# Patient Record
Sex: Male | Born: 1970 | Race: White | Hispanic: No | Marital: Married | Smoking: Never smoker
Health system: Southern US, Community
[De-identification: ages and names within clinical notes are randomized; demographics above are authoritative.]

## PROBLEM LIST (undated history)

## (undated) DIAGNOSIS — K5792 Diverticulitis of intestine, part unspecified, without perforation or abscess without bleeding: Secondary | ICD-10-CM

## (undated) DIAGNOSIS — Z87442 Personal history of urinary calculi: Secondary | ICD-10-CM

## (undated) DIAGNOSIS — R42 Dizziness and giddiness: Secondary | ICD-10-CM

## (undated) DIAGNOSIS — L639 Alopecia areata, unspecified: Secondary | ICD-10-CM

## (undated) DIAGNOSIS — L309 Dermatitis, unspecified: Secondary | ICD-10-CM

## (undated) DIAGNOSIS — Z8489 Family history of other specified conditions: Secondary | ICD-10-CM

## (undated) DIAGNOSIS — G4730 Sleep apnea, unspecified: Secondary | ICD-10-CM

## (undated) DIAGNOSIS — F419 Anxiety disorder, unspecified: Secondary | ICD-10-CM

## (undated) DIAGNOSIS — R21 Rash and other nonspecific skin eruption: Secondary | ICD-10-CM

## (undated) DIAGNOSIS — Z7189 Other specified counseling: Secondary | ICD-10-CM

## (undated) DIAGNOSIS — Z113 Encounter for screening for infections with a predominantly sexual mode of transmission: Secondary | ICD-10-CM

## (undated) HISTORY — DX: Dizziness and giddiness: R42

## (undated) HISTORY — PX: ULNAR NERVE TRANSPOSITION: SHX2595

## (undated) HISTORY — DX: Alopecia areata, unspecified: L63.9

## (undated) HISTORY — PX: VASECTOMY: SHX75

## (undated) HISTORY — DX: Dermatitis, unspecified: L30.9

## (undated) HISTORY — PX: COLONOSCOPY: SHX174

## (undated) HISTORY — PX: TONSILLECTOMY: SUR1361

## (undated) HISTORY — PX: OTHER SURGICAL HISTORY: SHX169

---

## 2000-10-30 ENCOUNTER — Ambulatory Visit (HOSPITAL_BASED_OUTPATIENT_CLINIC_OR_DEPARTMENT_OTHER): Admission: RE | Admit: 2000-10-30 | Discharge: 2000-10-30 | Payer: Self-pay | Admitting: Surgery

## 2005-07-07 DIAGNOSIS — Z87442 Personal history of urinary calculi: Secondary | ICD-10-CM

## 2005-07-07 HISTORY — DX: Personal history of urinary calculi: Z87.442

## 2005-10-20 ENCOUNTER — Encounter: Admission: RE | Admit: 2005-10-20 | Discharge: 2005-10-20 | Payer: Self-pay | Admitting: Family Medicine

## 2005-10-20 IMAGING — US US SCROTUM
1 series · 14 of 25 positions shown · non-contrast
Comparison: none

CLINICAL DATA: Left testicular pain intermittently for one month.
SCROTAL ULTRASOUND:
TECHNIQUE: Complete ultrasound examination of the testicles, epididymis, and other scrotal structures was performed.
Testicles are normal in size and contour bilaterally with a normal ultrasound texture.  There is no evidence for a testicular mass.  There is normal color Doppler flow associated with the testicles bilaterally.  The right epididymis contains a 1.2 x .6 x .8 cm size simple appearing cyst within its tail portion.  There is also a tiny, 3-4 mm in size, right epididymal cyst seen within the head of the epididymis.  The left epididymis contains a 1 x .6 cm x .6 cm size simple appearing cyst within the body of the epididymis.  There is a small left hydrocele.  There is no varicocele.

[Series 1: unknown · 0.09mm/px · 14 of 53 slices shown]
[im 1/53]
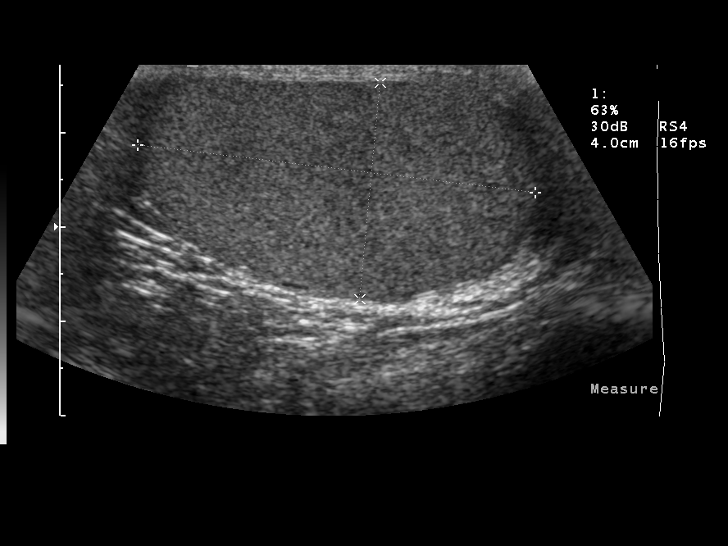
[im 5/53]
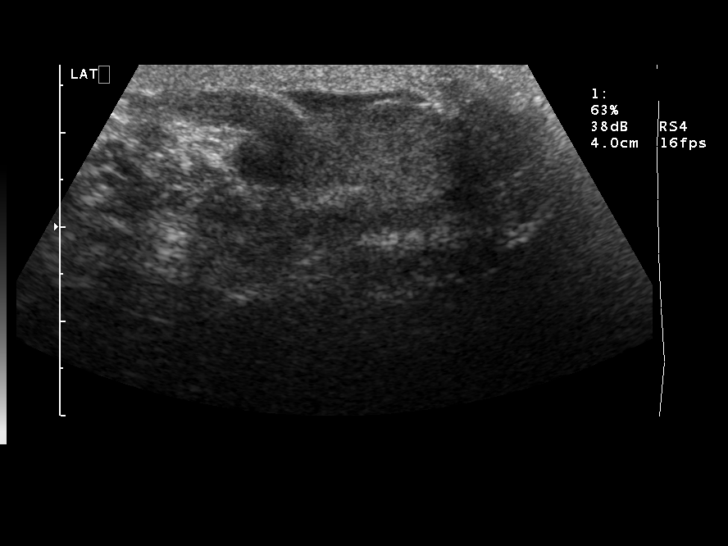
[im 9/53]
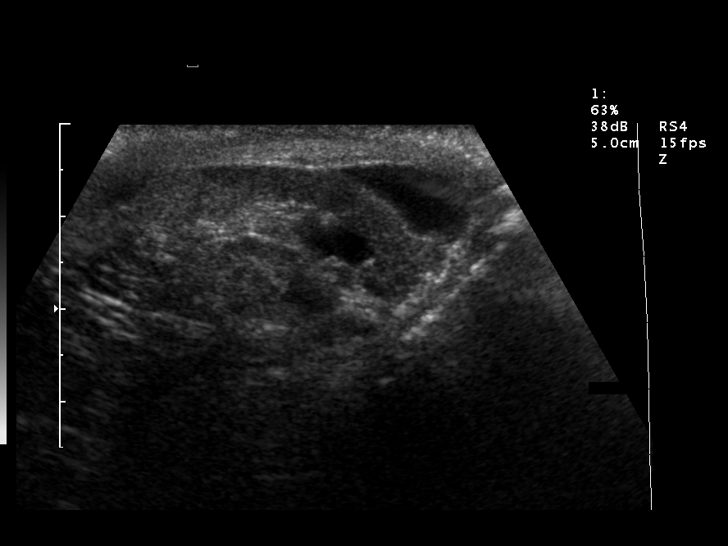
[im 14/53]
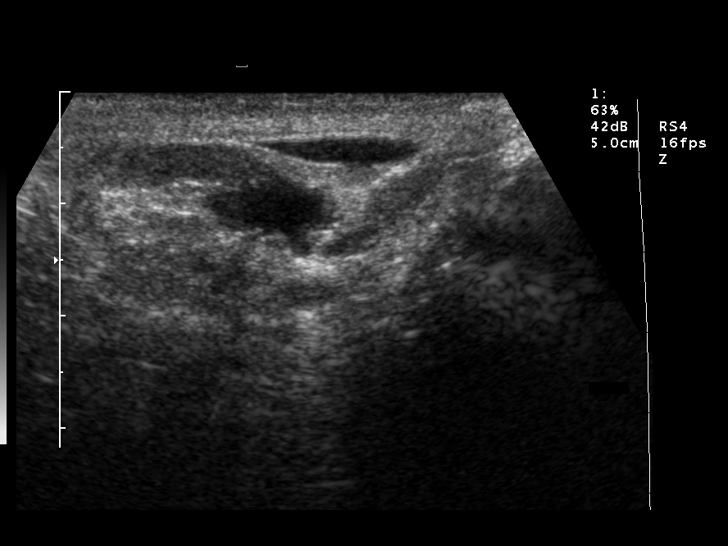
[im 18/53]
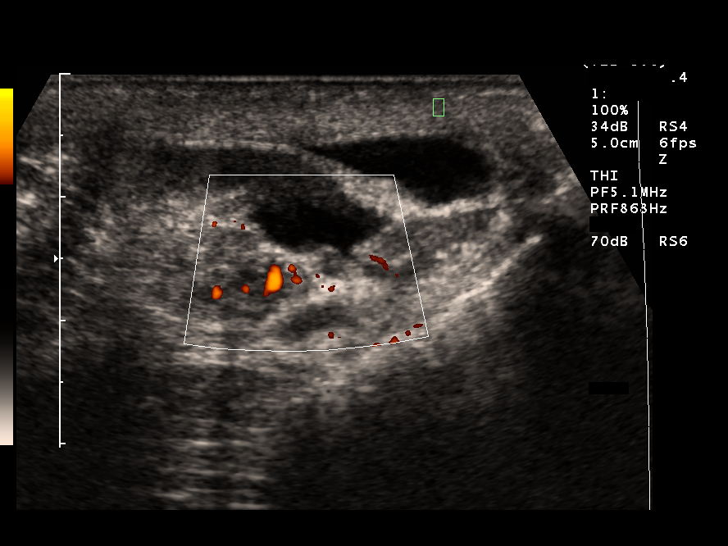
[im 20/53]
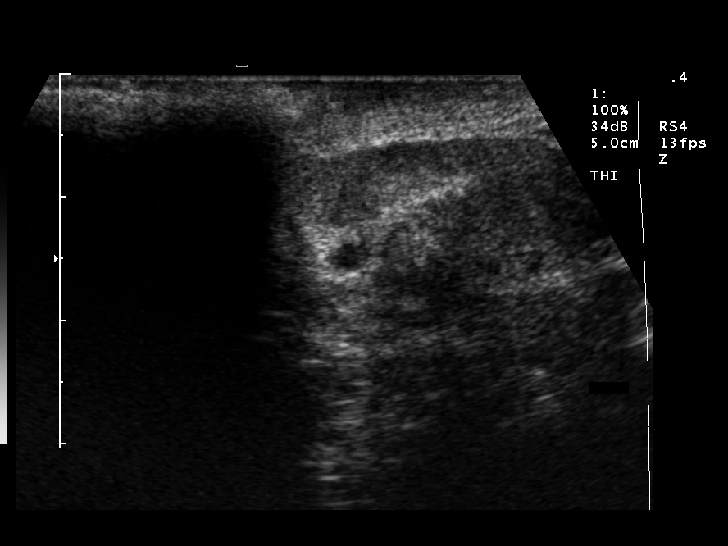
[im 24/53]
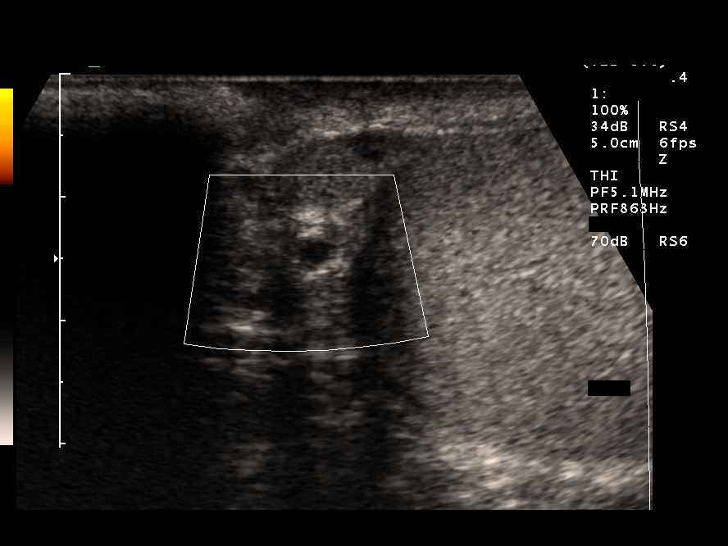
[im 29/53]
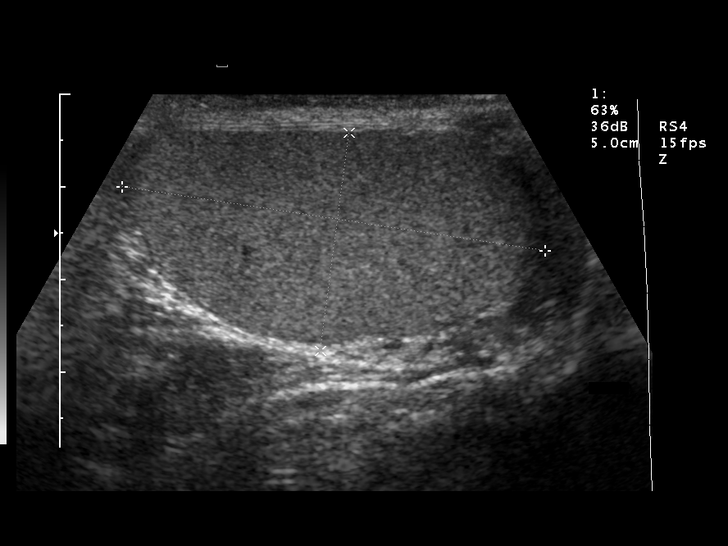
[im 33/53]
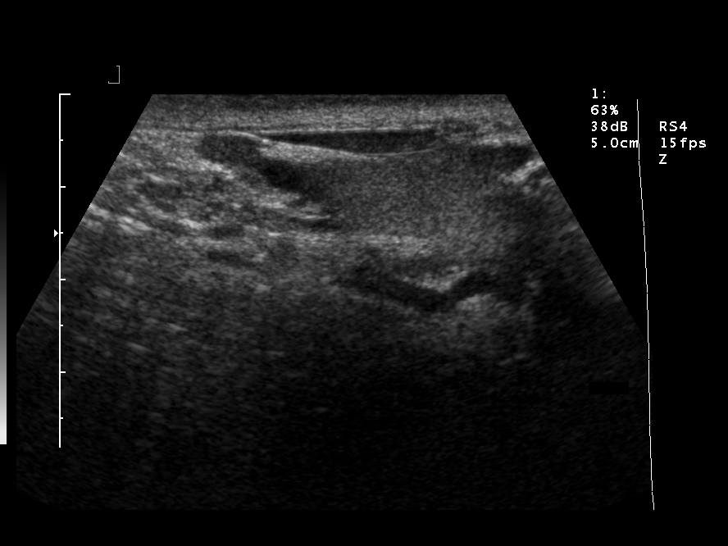
[im 35/53]
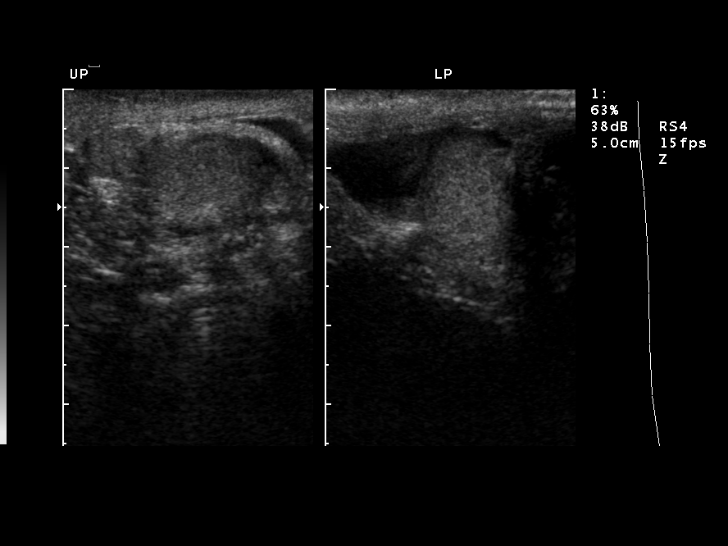
[im 40/53]
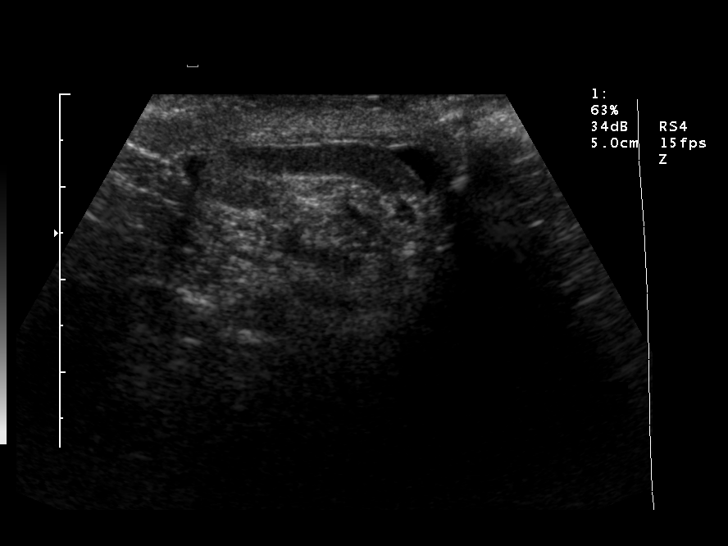
[im 44/53]
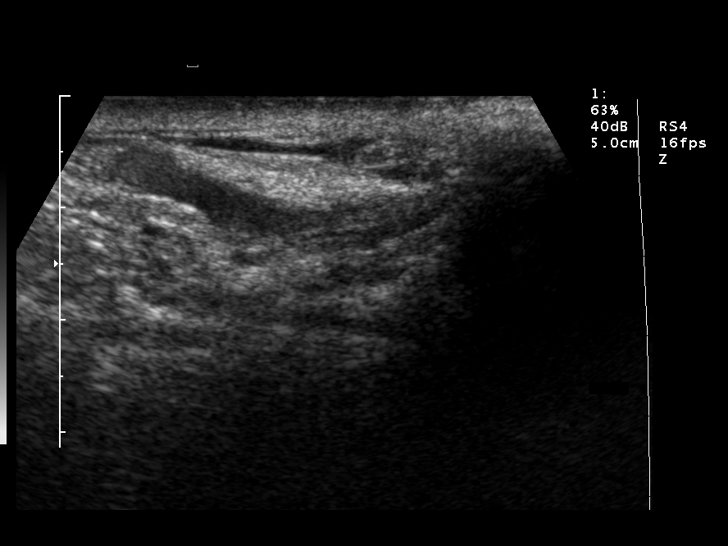
[im 48/53]
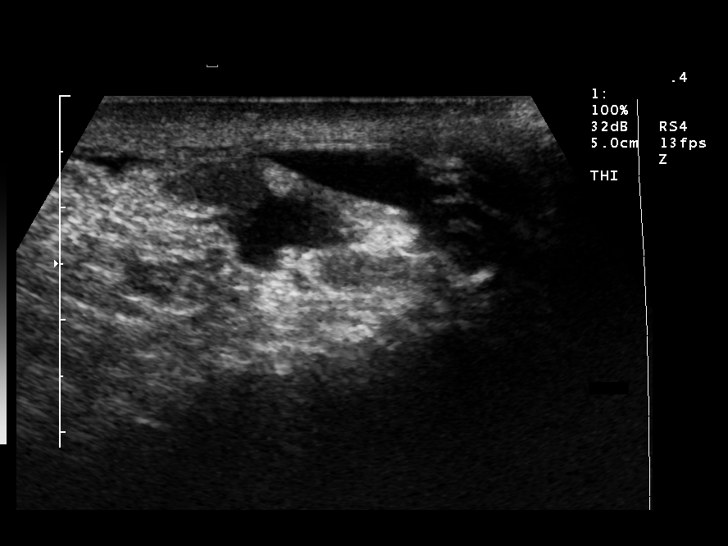
[im 53/53]
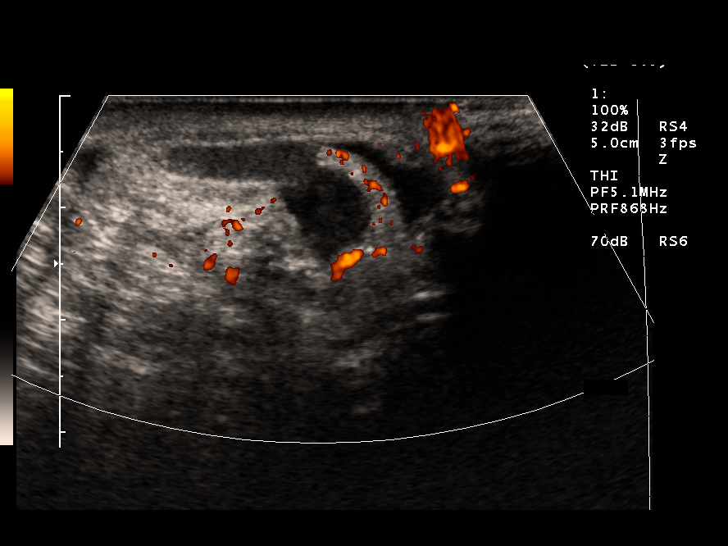

[14 of 25 positions shown; findings below may reference images not displayed]

IMPRESSION: Bilateral simple appearing epididymal cysts.  Normal appearing testicles.  Small left hydrocele.

## 2009-10-08 ENCOUNTER — Emergency Department (HOSPITAL_COMMUNITY): Admission: EM | Admit: 2009-10-08 | Discharge: 2009-10-08 | Payer: Self-pay | Admitting: Family Medicine

## 2010-04-06 IMAGING — CR DG ORBITS FOR FOREIGN BODY
2 series · 2 of 2 positions shown · non-contrast
Comparison: None.

CLINICAL DATA: 40-year-old male with history of metal exposure to
the orbits.  Planned MRI.

ORBITS FOR FOREIGN BODY - 2 VIEW

[w waters]
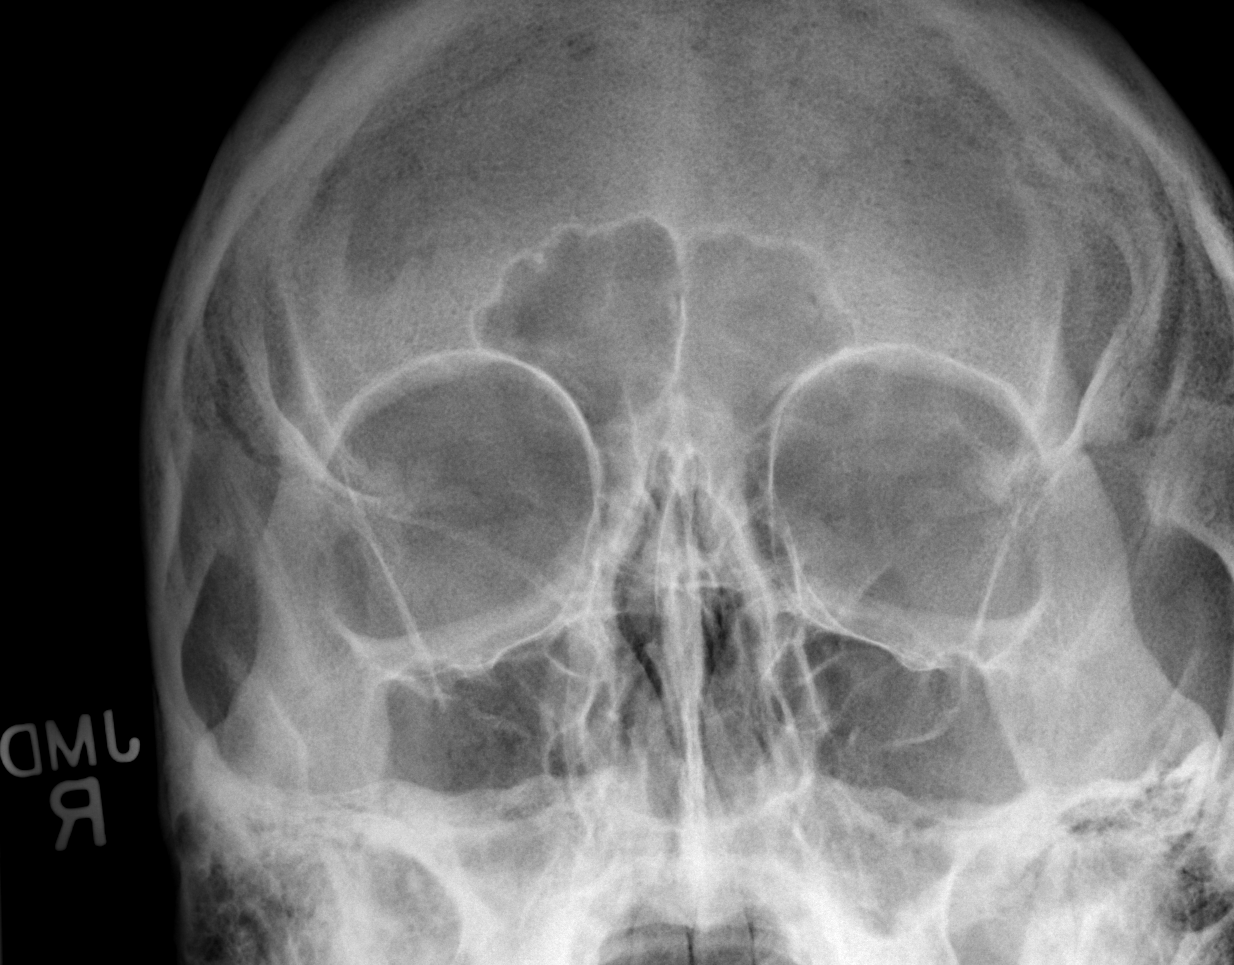

[w waters *]
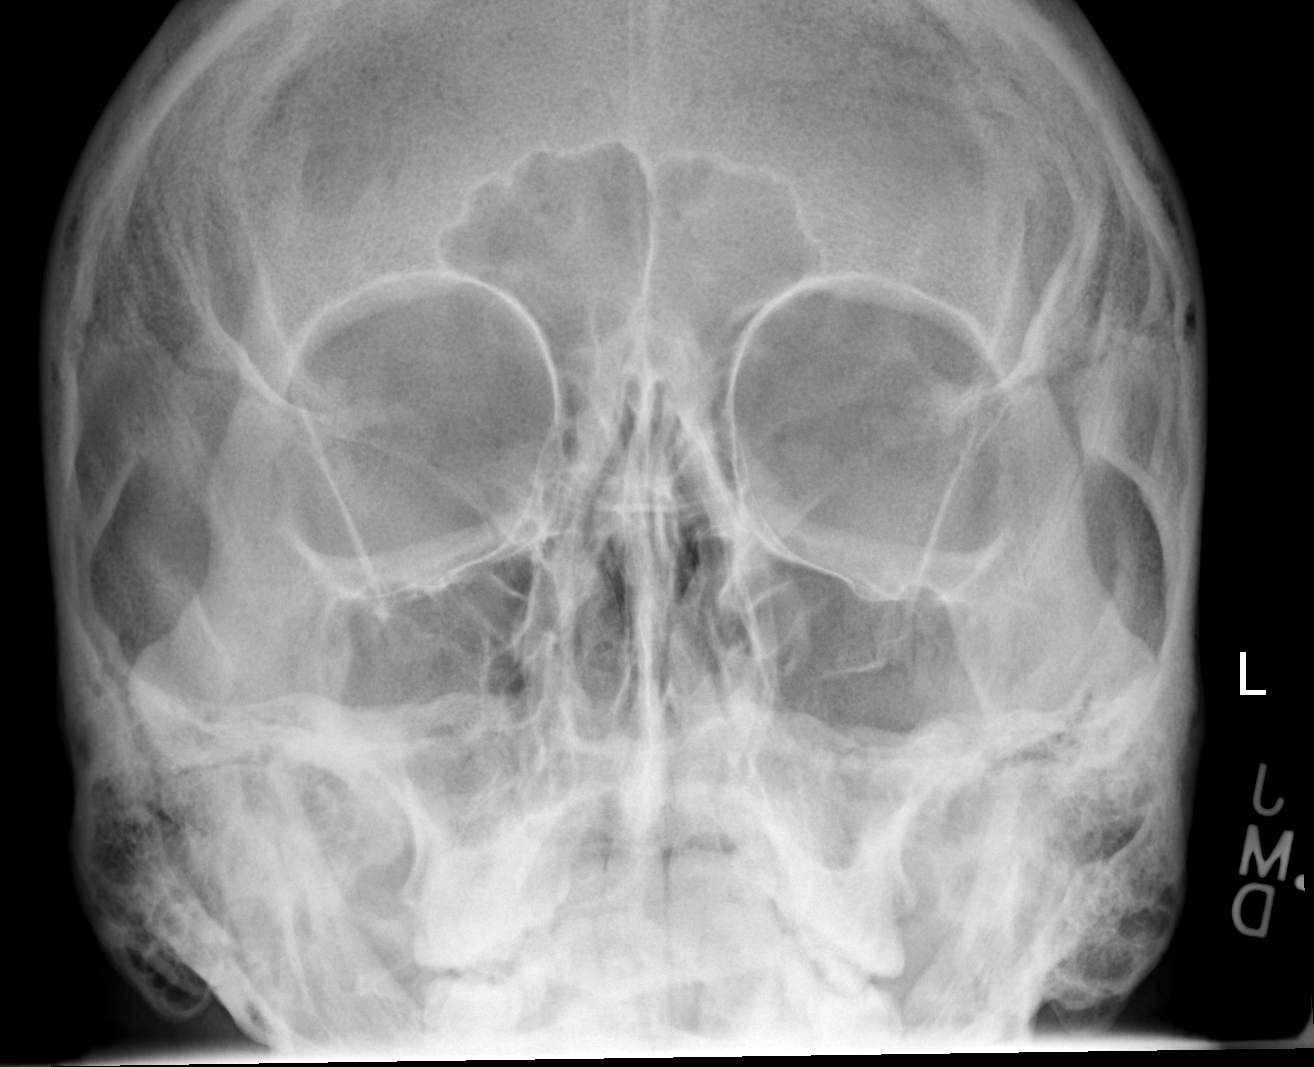

[2 of 2 positions shown; findings below may reference images not displayed]

FINDINGS: There is no evidence of metallic foreign body within the
orbits.  No significant bone abnormality identified. .
IMPRESSION: No evidence of metallic foreign body within the orbits.

## 2010-11-22 NOTE — Op Note (Signed)
[place]  Patient:    MARSHAUN, LORTIE                         MRN: [id number] Proc. Date: 10/30/00 Adm. Date:  [id number] Attending:  Katha Cabal                           Operative Report  CCS [phone].  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  POSTOPERATIVE DIAGNOSIS:  Eight lipomata involving left arm, right leg, left flank, right flank.  PROCEDURE:  Excision of nine separate lipomata; one right leg, one left arm, four left flank, three right flank.  DESCRIPTION OF PROCEDURE:  Cristofher Livecchi was taken to OR 7, given general by LMA. We started doing the arm and leg first, and we moved to the left flank and then the right flank, having to shift the patient.  Each time, the area was prepped with Betadine and draped sterilely.  These were removed with a small transverse incision.  They seemed to shell out nicely.  Some on the flank were quite big and multilobulated.  The resultant defects were then closed with 4-0 Vicryl subcuticularly, with benzoin and Steri-Strips.  These had an average size of 2 cm in diameter, and some were even larger.  The patient tolerated the procedure well and was taken to the recovery room.  He will be given Vicodin for pain.  He will be followed up in the office in about three weeks. DD:  10/30/00 TD:  10/31/00 Job: [id number]

## 2010-12-03 ENCOUNTER — Other Ambulatory Visit (HOSPITAL_COMMUNITY): Payer: Self-pay | Admitting: Orthopedic Surgery

## 2010-12-03 ENCOUNTER — Ambulatory Visit (HOSPITAL_COMMUNITY)
Admission: RE | Admit: 2010-12-03 | Discharge: 2010-12-03 | Disposition: A | Discharge status: Home / Self Care | Payer: Worker's Compensation | Source: Ambulatory Visit | Attending: Orthopedic Surgery | Admitting: Orthopedic Surgery

## 2010-12-03 DIAGNOSIS — Z0389 Encounter for observation for other suspected diseases and conditions ruled out: Secondary | ICD-10-CM | POA: Insufficient documentation

## 2010-12-03 DIAGNOSIS — IMO0002 Reserved for concepts with insufficient information to code with codable children: Secondary | ICD-10-CM

## 2013-09-14 LAB — AMB EXT LDL-C: LDL-C, External: 70

## 2014-04-13 NOTE — Telephone Encounter (Signed)
Pt called wanting refill on mobic script. Could not locate the script and dosage in records to get it reordered for you.

## 2014-04-13 NOTE — Telephone Encounter (Signed)
Please find out pharmacy.  I dont' think that I've ever given it to him before have I?   Either that or confirm this is the right patient.  But I will erx a few months work.  When I see him in November he will let me know how it works, Catering manageretc.

## 2014-04-14 MED ORDER — MELOXICAM 15 MG TAB
15 mg | ORAL_TABLET | Freq: Every day | ORAL | Status: DC
Start: 2014-04-14 — End: 2014-05-12

## 2014-04-14 NOTE — Telephone Encounter (Signed)
Verified mobic 15 mg 1 tab daily, [facility] which I just added to pt chart

## 2014-04-14 NOTE — Telephone Encounter (Signed)
erxd

## 2014-05-12 ENCOUNTER — Ambulatory Visit: Admit: 2014-05-12 | Discharge: 2014-05-12 | Payer: BLUE CROSS/BLUE SHIELD | Attending: Family | Primary: Family

## 2014-05-12 DIAGNOSIS — Z0000 Encounter for general adult medical examination without abnormal findings: Secondary | ICD-10-CM

## 2014-05-12 LAB — AMB POC COMPLETE CBC,AUTOMATED ENTER (RFM)
GRANULOCYTES (POC): 60.1 % (ref 45.0–75.0)
HCT (POC): 57.2 % — AB (ref 41.0–53.0)
HGB (POC): 19.1 g/dL — AB (ref 13.6–17.5)
LYMPHOCYTES (POC): 32.9 % (ref 15.0–45.0)
MCH (POC): 30.6 pg (ref 26–34)
MCHC (POC): 33.5 g/dL (ref 31–37)
MCV (POC): 91.4 fL (ref 80–100)
MONOCYTES (POC): 7 % (ref 2.0–11.0)
PLATELET (POC): 166 10*3/uL (ref 140–440)
RBC (POC): 6.26 10*6/uL — AB (ref 4.50–5.90)
WBC (POC): 5.3 10*3/uL (ref 3.4–10.0)

## 2014-05-12 LAB — AMB POC URINALYSIS DIP STICK MANUAL W/ MICRO
Amorphous Crystals: 0
Bacteria (UA POC): 0
Bilirubin (UA POC): NEGATIVE
Blood (UA POC): NEGATIVE
Casts: 0 [HPF] (ref 0–1)
Crystals (UA POC): 0
Glucose (UA POC): NEGATIVE
Ketones (UA POC): NEGATIVE
Leukocyte esterase (UA POC): NEGATIVE
Nitrites (UA POC): NEGATIVE
Other:: 0
Protein (UA POC): NEGATIVE mg/dL
RBC: 0 [HPF] (ref 0–3)
Specific gravity (UA POC): 1.02 (ref 1.003–1.035)
Urobilinogen (UA POC): 0.2 (ref 0.2–1)
pH (UA POC): 6 (ref 4.6–8.0)

## 2014-05-12 MED ORDER — MELOXICAM 15 MG TAB
15 mg | ORAL_TABLET | Freq: Every day | ORAL | Status: AC
Start: 2014-05-12 — End: 2014-06-11

## 2014-05-12 MED ORDER — AZITHROMYCIN 250 MG TAB
250 mg | Freq: Every day | ORAL | Status: AC
Start: 2014-05-12 — End: 2014-05-17

## 2014-05-12 MED ORDER — ATORVASTATIN 40 MG TAB
40 mg | ORAL_TABLET | Freq: Every day | ORAL | Status: AC
Start: 2014-05-12 — End: 2014-08-10

## 2014-05-12 MED ORDER — LISINOPRIL 5 MG TAB
5 mg | ORAL_TABLET | Freq: Every day | ORAL | Status: AC
Start: 2014-05-12 — End: 2014-08-10

## 2014-05-12 MED ORDER — CPAP MACHINE
PACK | Status: DC
Start: 2014-05-12 — End: 2016-07-13

## 2014-05-12 NOTE — Progress Notes (Signed)
Quick Note:        No additional comment    ______

## 2014-05-12 NOTE — Progress Notes (Signed)
Quick Note:        Normal urine    ______

## 2014-05-12 NOTE — Progress Notes (Signed)
Chief Complaint   Patient presents with   ??? Complete Physical        Alexander Howell is a 43 y.o. male   who is here for a physical.  States diet has been balanced including fruits, vegetables and proteins. Patient is  exercising regularly.  Reports occasional alcohol use and does not use tobacco products.  Does see the dentist regularly and has kept up with opthalmologstatic exams as appropriate.  When patient is out in sun for extended periods of time sun block is  used. Reports stable mood.  ROS below for purpose of exam today .   Has been nursing a cold last 7 days. States that in his chest.   States that gets a good bit of congestion up, has been using mucinex, no fever.   Also needs updated supplies on his cpap. Needs mask.     Past Medical History   Diagnosis Date   ??? Essential hypertension, benign    ??? Mixed hyperlipidemia    ??? Allergic rhinitis, cause unspecified    ??? Sleep apnea        Current Outpatient Prescriptions on File Prior to Visit   Medication Sig Dispense Refill   ??? meloxicam (MOBIC) 15 mg tablet Take 1 Tab by mouth daily for 30 days. 30 Tab 3   ??? lisinopril (PRINIVIL, ZESTRIL) 5 mg tablet Take  by mouth daily.     ??? atorvastatin (LIPITOR) 40 mg tablet Take  by mouth daily.     ??? cetirizine (ZYRTEC) 10 mg tablet Take  by mouth daily.       No current facility-administered medications on file prior to visit.       No past surgical history on file.    Family History   Problem Relation Age of Onset   ??? Hypertension Mother    ??? High Cholesterol Mother    ??? Hypertension Father    ??? High Cholesterol Father    ??? Diabetes Maternal Aunt    ??? Heart Disease Paternal Uncle    ??? Heart Disease Paternal Grandfather        No Known Allergies    History     Social History   ??? Marital Status: MARRIED     Spouse Name: N/A     Number of Children: N/A   ??? Years of Education: N/A     Occupational History   ??? Not on file.     Social History Main Topics   ??? Smoking status: Never Smoker     ??? Smokeless tobacco: Not on file   ??? Alcohol Use: 0.0 oz/week     0 Not specified per week      Comment: monthly or less; 1-2 drinks   ??? Drug Use: Not on file   ??? Sexual Activity: Not on file     Other Topics Concern   ??? Not on file     Social History Narrative       Review of Systems - General ROS: negative for - chills, fatigue, fever, malaise, night sweats, weight gain or weight loss  Ophthalmic ROS: negative for - decreased vision, eye pain or loss of vision  ENT ROS: negative for - epistaxis, headaches, hearing change, nasal congestion, sinus pain, sore throat or tinnitus  Allergy and Immunology ROS: negative for - hives, itchy/watery eyes, nasal congestion or seasonal allergies  Hematological and Lymphatic ROS: negative for - bleeding problems, bruising, night sweats, swollen lymph nodes or weight loss  Endocrine ROS: negative for - breast changes, galactorrhea, hot flashes, mood swings, polydipsia/polyuria or unexpected weight changes  Breast ROS: negative for - changes  Respiratory ROS: negative for - cough, orthopnea, shortness of breath, tachypnea or wheezing  Cardiovascular ROS: negative for - chest pain, dyspnea on exertion, edema, irregular heartbeat, loss of consciousness, orthopnea, palpitations, paroxysmal nocturnal dyspnea, rapid heart rate or shortness of breath  Gastrointestinal ROS: negative for - abdominal pain, appetite loss, blood in stools, change in bowel habits, change in stools, constipation, diarrhea or heartburn  Genito-Urinary ROS: negative for - urinary hesitancy, urinary frequency, weak urinary stream, testicle lumps or pain, penile discharge  Musculoskeletal ROS: negative for - gait disturbance, joint pain, joint stiffness, joint swelling or muscular weakness  Neurological ROS: negative for - gait disturbance, headaches, impaired coordination/balance, memory loss, numbness/tingling, seizures, tremors or weakness  Dermatological ROS: negative for - dry skin, eczema, hair changes, mole  changes, pruritus or rash          BP 110/70 mmHg   Temp(Src) 97.2 ??F (36.2 ??C) (Tympanic)   Ht 5\' 8"  (1.727 m)   Wt 231 lb 3.2 oz (104.872 kg)   BMI 35.16 kg/m2    Physical Examination: General appearance - alert, well appearing, and in no distress, oriented to person, place, and time, normal appearing weight and acyanotic, in no respiratory distress  Mental status - alert, oriented to person, place, and time, normal mood, behavior, speech, dress, motor activity, and thought processes  Eyes - pupils equal and reactive, extraocular eye movements intact, sclera anicteric  Ears - bilateral TM's and external ear canals normal  Nose - normal and patent, no erythema, discharge or polyps  Mouth - mucous membranes moist, pharynx normal without lesions, tonsils normal, dental hygiene good and tongue normal  Neck - supple, no significant adenopathy, carotids upstroke normal bilaterally, no bruits, thyroid exam: thyroid is normal in size without nodules or tenderness  Lymphatics - no palpable lymphadenopathy, no hepatosplenomegaly  Chest - clear to auscultation, no wheezes, rales or rhonchi, symmetric air entry  Heart - normal rate, regular rhythm, normal S1, S2, no murmurs, rubs, clicks or gallops  Abdomen - soft, nontender, nondistended, no masses or organomegaly  GU - Normal genitalia, no testicular masses or hernias, Prostate Normal  Back exam - full range of motion, no tenderness, palpable spasm or pain on motion  Neurological - alert, oriented, normal speech, no focal findings or movement disorder noted  Musculoskeletal - no joint tenderness, deformity or swelling  Extremities - peripheral pulses normal, no pedal edema, no clubbing or cyanosis  Skin - normal coloration and turgor, no rashes, no suspicious skin lesions noted      Assessment/plan  Wellness visit- Overall appears to be in good physical condition.  Routine screening labs obtained and will be reviewed upon return. Encouraged to  start or maintain healthy diet and or routine exercise. Reviewed and discussed appropriate vaccines and screenings and administered needed vaccines or referrals for screening.  Reviewed safety issues common for age bracket  1. Routine general medical examination at a health care facility    - CBC [phone]  - Urinalysis w/ Micro (81000)  - Venipuncture (40981)  - Comp. Metabolic Panel (14) (80053)  - LIPID PANEL  - TSH (19147)  - Prostate-Specific Ag, Serum (82956)  - EKG (93000)    2. Prostate cancer screening    - Venipuncture (21308)      Orders Placed This Encounter   ??? Venipuncture (65784)   ???  Comp. Metabolic Panel (14) [phone](80053)   ??? LIPID PANEL   ??? TSH (60454(84443)   ??? Prostate-Specific Ag, Serum (09811(84153)   ??? CBC (91478(85025)   ??? Urinalysis w/ Micro (81000)   ??? EKG (93000)     Order Specific Question:  Reason for Exam:     Answer:  routine physical exam.       Alexander HamburgMatthew T Ross Bender, NP

## 2014-05-13 LAB — LIPID PANEL
Cholesterol, total: 155 mg/dL (ref 100–199)
HDL Cholesterol: 44 mg/dL (ref >39)
LDL, calculated: 68 mg/dL (ref 0–99)
Triglyceride: 216 mg/dL — ABNORMAL HIGH (ref 0–149)
VLDL, calculated: 43 mg/dL — ABNORMAL HIGH (ref 5–40)

## 2014-05-13 LAB — METABOLIC PANEL, COMPREHENSIVE
A-G Ratio: 2.1 (ref 1.1–2.5)
ALT (SGPT): 52 IU/L — ABNORMAL HIGH (ref 0–44)
AST (SGOT): 30 IU/L (ref 0–40)
Albumin: 4.7 g/dL (ref 3.5–5.5)
Alk. phosphatase: 68 IU/L (ref 39–117)
BUN/Creatinine ratio: 12 (ref 9–20)
BUN: 14 mg/dL (ref 6–24)
Bilirubin, total: 0.9 mg/dL (ref 0.0–1.2)
CO2: 22 mmol/L (ref 18–29)
Calcium: 9.2 mg/dL (ref 8.7–10.2)
Chloride: 102 mmol/L (ref 97–108)
Creatinine: 1.15 mg/dL (ref 0.76–1.27)
GFR est AA: 88 mL/min/{1.73_m2} (ref >59)
GFR est non-AA: 76 mL/min/{1.73_m2} (ref >59)
GLOBULIN, TOTAL: 2.2 g/dL (ref 1.5–4.5)
Glucose: 93 mg/dL (ref 65–99)
Potassium: 4.6 mmol/L (ref 3.5–5.2)
Protein, total: 6.9 g/dL (ref 6.0–8.5)
Sodium: 140 mmol/L (ref 134–144)

## 2014-05-13 LAB — TSH 3RD GENERATION: TSH: 2.27 u[IU]/mL (ref 0.450–4.500)

## 2014-05-13 LAB — PSA, DIAGNOSTIC (PROSTATE SPECIFIC AG): Prostate Specific Ag: 0.2 ng/mL (ref 0.0–4.0)

## 2014-05-15 NOTE — Progress Notes (Signed)
Quick Note:        Patient rbc and hgb are elevated a lot more than last time. Please make sure he is well hydrated and come back in this week to have his cbc re-checked, and possibly checking iron levels as well.    Cholesterol is great.    Glucose , kidney, electrolytes are normal. Liver enzymes are ok.    Thyroid normal. Prostates normal.    ______

## 2014-05-15 NOTE — Telephone Encounter (Signed)
appt set for Friday for labs

## 2014-05-15 NOTE — Telephone Encounter (Signed)
noted 

## 2014-05-15 NOTE — Telephone Encounter (Signed)
-----   Message from Earney HamburgMatthew T Keaton, NP sent at 05/15/2014  8:32 AM EST -----  Patient rbc and hgb are elevated a lot more than last time.  Please make sure he is well hydrated and come back in this week to have his cbc re-checked, and possibly checking iron levels as well.   Cholesterol is great.   Glucose , kidney, electrolytes are normal. Liver enzymes are ok.   Thyroid normal. Prostates normal.

## 2014-05-19 ENCOUNTER — Other Ambulatory Visit: Admit: 2014-05-19 | Discharge: 2014-05-19 | Payer: BLUE CROSS/BLUE SHIELD | Primary: Family

## 2014-05-19 DIAGNOSIS — D582 Other hemoglobinopathies: Secondary | ICD-10-CM

## 2014-05-19 LAB — AMB POC COMPLETE CBC,AUTOMATED ENTER (RFM)
GRANULOCYTES (POC): 44.8 % — AB (ref 45.0–75.0)
HCT (POC): 44.6 % (ref 41.0–53.0)
HGB (POC): 14.5 g/dL (ref 13.6–17.5)
LYMPHOCYTES (POC): 46.1 % — AB (ref 15.0–45.0)
MCH (POC): 29.5 pg (ref 26–34)
MCHC (POC): 32.5 g/dL (ref 31–37)
MCV (POC): 90.7 fL (ref 80–100)
MONOCYTES (POC): 9.1 % (ref 2.0–11.0)
PLATELET (POC): 259 10*3/uL (ref 140–440)
RBC (POC): 4.91 10*6/uL (ref 4.50–5.90)
WBC (POC): 6.3 10*3/uL (ref 3.4–10.0)

## 2014-05-19 NOTE — Progress Notes (Signed)
Quick Note:        Normal blood counts    ______

## 2014-05-20 LAB — IRON PROFILE
Iron % saturation: 31 % (ref 15–55)
Iron: 94 ug/dL (ref 40–155)
TIBC: 307 ug/dL (ref 250–450)
UIBC: 213 ug/dL (ref 150–375)

## 2014-05-20 LAB — FERRITIN: Ferritin: 282 ng/mL (ref 30–400)

## 2014-05-22 NOTE — Progress Notes (Signed)
Quick Note:        Patients iron levels are normal. His blood counts are back to normal. That either tells me it was probably related to him fasting, and once we checked more levels with him drinking water, they went back to normal. But look good.    ______

## 2014-05-22 NOTE — Telephone Encounter (Signed)
-----   Message from Earney HamburgMatthew T Keaton, NP sent at 05/22/2014  8:12 AM EST -----  Patients iron levels are normal. His blood counts are back to normal.  That either tells me it was probably related to him fasting, and once we checked more levels with him drinking water, they went back to normal. But look good.

## 2014-05-22 NOTE — Telephone Encounter (Signed)
Pt notified last labs were normal

## 2015-04-11 MED ORDER — FLUTICASONE 50 MCG/ACTUATION NASAL SPRAY, SUSP
50 mcg/actuation | Freq: Every day | NASAL | 1 refills | Status: DC
Start: 2015-04-11 — End: 2015-05-18

## 2015-04-11 NOTE — Telephone Encounter (Signed)
Informed pt prescription sent to pharmacy.

## 2015-05-18 ENCOUNTER — Ambulatory Visit: Admit: 2015-05-18 | Discharge: 2015-05-18 | Payer: BLUE CROSS/BLUE SHIELD | Attending: Family | Primary: Family

## 2015-05-18 DIAGNOSIS — Z0000 Encounter for general adult medical examination without abnormal findings: Secondary | ICD-10-CM

## 2015-05-18 LAB — AMB POC URINALYSIS DIP STICK MANUAL W/ MICRO
Amorphous Crystals: 0
Bilirubin (UA POC): NEGATIVE
Blood (UA POC): NEGATIVE
Casts: 0 [HPF] (ref 0–1)
Crystals (UA POC): NEGATIVE
Glucose (UA POC): NEGATIVE
Ketones (UA POC): NEGATIVE
Leukocyte esterase (UA POC): NEGATIVE
Nitrites (UA POC): NEGATIVE
Other:: 0
Protein (UA POC): NEGATIVE mg/dL
RBC: 0 [HPF] (ref 0–3)
Specific gravity (UA POC): 1.005 (ref 1.003–1.035)
Urobilinogen (UA POC): 0.2 (ref 0.2–1)
pH (UA POC): 5 (ref 4.6–8.0)

## 2015-05-18 MED ORDER — ATORVASTATIN 40 MG TAB
40 mg | ORAL_TABLET | Freq: Every day | ORAL | 3 refills | Status: DC
Start: 2015-05-18 — End: 2016-05-23

## 2015-05-18 MED ORDER — LISINOPRIL 5 MG TAB
5 mg | ORAL_TABLET | Freq: Every day | ORAL | 3 refills | Status: DC
Start: 2015-05-18 — End: 2016-05-26

## 2015-05-18 MED ORDER — FLUTICASONE 50 MCG/ACTUATION NASAL SPRAY, SUSP
50 mcg/actuation | Freq: Every day | NASAL | 3 refills | Status: AC
Start: 2015-05-18 — End: ?

## 2015-05-18 NOTE — Progress Notes (Signed)
Urine normal.

## 2015-05-18 NOTE — Progress Notes (Signed)
Chief Complaint   Patient presents with   ??? Complete Physical     fasting        Alexander Howell is a 44 y.o. male   who is here for a physical.  States diet has been balanced including fruits, vegetables and proteins. Patient is  exercising regularly.  Reports occasional alcohol use and does not use tobacco products.  Does see the dentist regularly and has kept up with opthalmologic exams as appropriate.  When patient is out in sun for extended periods of time sun block is  used. Reports stable mood.  ROS below for purpose of exam today .       Past Medical History   Diagnosis Date   ??? Allergic rhinitis, cause unspecified    ??? Essential hypertension, benign    ??? Mixed hyperlipidemia    ??? Sleep apnea        Current Outpatient Prescriptions   Medication Sig Dispense Refill   ??? atorvastatin (LIPITOR) 40 mg tablet      ??? lisinopril (PRINIVIL, ZESTRIL) 5 mg tablet      ??? meloxicam (MOBIC) 15 mg tablet      ??? fluticasone (FLONASE) 50 mcg/actuation nasal spray 2 Sprays by Both Nostrils route daily. 1 Bottle 1   ??? cpap machine kit Needs new mask, hose, head gear, and filter for cpap therapy. 1 Kit 0       History reviewed. No pertinent past surgical history.    Family History   Problem Relation Age of Onset   ??? Hypertension Mother    ??? High Cholesterol Mother    ??? Hypertension Father    ??? High Cholesterol Father    ??? Diabetes Maternal Aunt    ??? Heart Disease Paternal Uncle    ??? Heart Disease Paternal Grandfather        No Known Allergies    Social History     Social History   ??? Marital status: MARRIED     Spouse name: N/A   ??? Number of children: N/A   ??? Years of education: N/A     Occupational History   ??? Not on file.     Social History Main Topics   ??? Smoking status: Never Smoker   ??? Smokeless tobacco: Never Used   ??? Alcohol use 0.0 oz/week     0 Standard drinks or equivalent per week      Comment: monthly or less; 1-2 drinks   ??? Drug use: Not on file   ??? Sexual activity: Not on file     Other Topics Concern    ??? Not on file     Social History Narrative       Review of Systems - General ROS: negative for - chills, fatigue, fever, malaise, night sweats, weight gain or weight loss  Ophthalmic ROS: negative for - decreased vision, eye pain or loss of vision  ENT ROS: negative for - epistaxis, headaches, hearing change, nasal congestion, sinus pain, sore throat or tinnitus  Allergy and Immunology ROS: negative for - hives, itchy/watery eyes, nasal congestion or seasonal allergies  Hematological and Lymphatic ROS: negative for - bleeding problems, bruising, night sweats, swollen lymph nodes or weight loss  Endocrine ROS: negative for - breast changes, galactorrhea, hot flashes, mood swings, polydipsia/polyuria or unexpected weight changes  Breast ROS: negative for - changes  Respiratory ROS: negative for - cough, orthopnea, shortness of breath, tachypnea or wheezing  Cardiovascular ROS: negative for - chest pain,  dyspnea on exertion, edema, irregular heartbeat, loss of consciousness, orthopnea, palpitations, paroxysmal nocturnal dyspnea, rapid heart rate or shortness of breath  Gastrointestinal ROS: negative for - abdominal pain, appetite loss, blood in stools, change in bowel habits, change in stools, constipation, diarrhea or heartburn  Genito-Urinary ROS: negative for - urinary hesitancy, urinary frequency, weak urinary stream, testicle lumps or pain, penile discharge  Musculoskeletal ROS: negative for - gait disturbance, joint pain, joint stiffness, joint swelling or muscular weakness, right groin pain.   Neurological ROS: negative for - gait disturbance, headaches, impaired coordination/balance, memory loss, numbness/tingling, seizures, tremors or weakness  Dermatological ROS: negative for - dry skin, eczema, hair changes, mole changes, pruritus or rash        Vitals:    05/18/15 0809   BP: 118/80   Resp: 16   Temp: 96.9 ??F (36.1 ??C)   TempSrc: Tympanic   Weight: 225 lb 9.6 oz (102.3 kg)   Height: 5' 8"  (1.727 m)          Physical Examination: General appearance - alert, well appearing, and in no distress, oriented to person, place, and time, normal appearing weight and acyanotic, in no respiratory distress  Mental status - alert, oriented to person, place, and time, normal mood, behavior, speech, dress, motor activity, and thought processes  Eyes - pupils equal and reactive, extraocular eye movements intact, sclera anicteric  Ears - bilateral TM's and external ear canals normal  Nose - normal and patent, no erythema, discharge or polyps  Mouth - mucous membranes moist, pharynx normal without lesions, tonsils normal, dental hygiene good and tongue normal  Neck - supple, no significant adenopathy, carotids upstroke normal bilaterally, no bruits,   Thyroid -  thyroid is normal in size without nodules or tenderness  Lymphatics - no palpable lymphadenopathy, no hepatosplenomegaly  Chest - clear to auscultation, no wheezes, rales or rhonchi, symmetric air entry  Heart - normal rate, regular rhythm, normal S1, S2, no murmurs, rubs, clicks or gallops  Abdomen - soft, nontender, nondistended, no masses or organomegaly  GU - Normal genitalia, no testicular masses or hernias, Prostate Normal  Back exam - full range of motion, no tenderness, palpable spasm or pain on motion  Neurological - alert, oriented, normal speech, no focal findings or movement disorder noted  Musculoskeletal - no joint tenderness, deformity or swelling, there is pain in the right groin when stretching.   Extremities - peripheral pulses normal, no pedal edema, no clubbing or cyanosis  Skin - normal coloration and turgor, no rashes, no suspicious skin lesions noted      Assessment/plan  Wellness visit- Overall appears to be in good physical condition.  Routine screening labs obtained and will be reviewed upon return. Encouraged to start or maintain healthy diet and or routine exercise. Reviewed and discussed appropriate vaccines and screenings and administered needed  vaccines or referrals for screening.  Reviewed safety issues common for age bracket. Fecal occult blood cards were not provided to patient for completion with explanation for their use.    1. Routine general medical examination at a health care facility    - Urinalysis w/ Micro (81000)  - Lipid Panel (62376)  - CBC With Differential/Platelet (28315)  - Comp. Metabolic Panel (14) (17616)  - TSH (07371)  - Prostate-Specific Ag, Serum (06269)    2. Prostate cancer screening    - Prostate-Specific Ag, Serum (48546)      3. Encounter for immunization    - Fluzone Quadrivalent vaccine (MDV)  -  PR IMM ADMIN, 1 SINGLE/COMBO GREATER THAN 18YO    4. Hyperlipidemia, unspecified hyperlipidemia type  Continue med    5. Essential hypertension  Continue med      Orders Placed This Encounter   ??? Lipid Panel (25852)   ??? CBC With Differential/Platelet [phone])   ??? Comp. Metabolic Panel (14) (23536)   ??? TSH [id number]   ??? Prostate-Specific Ag, Serum (54008)   ??? Urinalysis w/ Micro (81000)   ??? atorvastatin (LIPITOR) 40 mg tablet   ??? lisinopril (PRINIVIL, ZESTRIL) 5 mg tablet   ??? meloxicam (MOBIC) 15 mg tablet       Loretha Brasil, NP

## 2015-05-19 LAB — CBC WITH AUTOMATED DIFF
ABS. BASOPHILS: 0 10*3/uL (ref 0.0–0.2)
ABS. EOSINOPHILS: 0.2 10*3/uL (ref 0.0–0.4)
ABS. IMM. GRANS.: 0 10*3/uL (ref 0.0–0.1)
ABS. MONOCYTES: 0.5 10*3/uL (ref 0.1–0.9)
ABS. NEUTROPHILS: 3.4 10*3/uL (ref 1.4–7.0)
Abs Lymphocytes: 2.2 10*3/uL (ref 0.7–3.1)
BASOPHILS: 1 %
EOSINOPHILS: 3 %
HCT: 43.3 % (ref 37.5–51.0)
HGB: 14.8 g/dL (ref 12.6–17.7)
IMMATURE GRANULOCYTES: 0 %
Lymphocytes: 35 %
MCH: 30.8 pg (ref 26.6–33.0)
MCHC: 34.2 g/dL (ref 31.5–35.7)
MCV: 90 fL (ref 79–97)
MONOCYTES: 8 %
NEUTROPHILS: 53 %
PLATELET: 249 10*3/uL (ref 150–379)
RBC: 4.81 x10E6/uL (ref 4.14–5.80)
RDW: 13.9 % (ref 12.3–15.4)
WBC: 6.3 10*3/uL (ref 3.4–10.8)

## 2015-05-19 LAB — LIPID PANEL
Cholesterol, total: 148 mg/dL (ref 100–199)
HDL Cholesterol: 47 mg/dL (ref >39)
LDL, calculated: 74 mg/dL (ref 0–99)
Triglyceride: 136 mg/dL (ref 0–149)
VLDL, calculated: 27 mg/dL (ref 5–40)

## 2015-05-19 LAB — METABOLIC PANEL, COMPREHENSIVE
A-G Ratio: 2 (ref 1.1–2.5)
ALT (SGPT): 28 IU/L (ref 0–44)
AST (SGOT): 26 IU/L (ref 0–40)
Albumin: 4.5 g/dL (ref 3.5–5.5)
Alk. phosphatase: 56 IU/L (ref 39–117)
BUN/Creatinine ratio: 12 (ref 9–20)
BUN: 13 mg/dL (ref 6–24)
Bilirubin, total: 0.8 mg/dL (ref 0.0–1.2)
CO2: 24 mmol/L (ref 18–29)
Calcium: 9.1 mg/dL (ref 8.7–10.2)
Chloride: 97 mmol/L (ref 97–106)
Creatinine: 1.12 mg/dL (ref 0.76–1.27)
GFR est AA: 91 mL/min/{1.73_m2} (ref >59)
GFR est non-AA: 78 mL/min/{1.73_m2} (ref >59)
GLOBULIN, TOTAL: 2.2 g/dL (ref 1.5–4.5)
Glucose: 89 mg/dL (ref 65–99)
Potassium: 4.3 mmol/L (ref 3.5–5.2)
Protein, total: 6.7 g/dL (ref 6.0–8.5)
Sodium: 137 mmol/L (ref 136–144)

## 2015-05-19 LAB — PSA, DIAGNOSTIC (PROSTATE SPECIFIC AG): Prostate Specific Ag: 0.2 ng/mL (ref 0.0–4.0)

## 2015-05-19 LAB — TSH 3RD GENERATION: TSH: 1.58 u[IU]/mL (ref 0.450–4.500)

## 2015-05-21 NOTE — Progress Notes (Signed)
lvm for pt stating all results came back normal and to continue cholesterol med. If you has any questions give me a call back.

## 2015-05-21 NOTE — Progress Notes (Signed)
Cholesterol looks good, continue his cholesterol medicine.   Blood counts are normal.   Glucosce, kidney, liver, electrolytes are normal   Prostate normal.

## 2015-06-08 NOTE — Telephone Encounter (Signed)
From: Bethanne GingerJames D Obey  To: Earney HamburgMatthew T Keaton, NP  Sent: 06/08/2015 1:03 PM EST  Subject:  Test Results Question    I  need a questionaire filled out. I'm trying to attach a photo of it. If you cannot open it please give me an email address I can send it to. Thanks.

## 2015-06-11 MED ORDER — CPAP MACHINE
PACK | 0 refills | Status: DC
Start: 2015-06-11 — End: 2015-06-12

## 2015-06-11 NOTE — Telephone Encounter (Signed)
From: Bethanne GingerJames D Bordley  To: Earney HamburgMatthew T Damaria Vachon, NP  Sent: 06/08/2015 2:07 PM EST  Subject:  Prescription Question    I  need a prescription for a cpap mask, headgear and hose. Here are the part numbers and specifics if you need them.       Product Unit  Price Quantity Total  AirFit???  N20 Nasal CPAP Mask with Headgear - Medium $99.00 1 $99.00  Return  Insurance For AirFit??? N20 Nasal CPAP Mask with Headgear $0.00 1 $0.00  White  6 Foot Performance 19mm Tubing with 22mm Easy Grip Cuffs $12.95 1 $12.95

## 2015-06-12 MED ORDER — CPAP MACHINE
PACK | 0 refills | Status: DC
Start: 2015-06-12 — End: 2016-07-13

## 2015-06-12 NOTE — Telephone Encounter (Signed)
From: Alexander GingerJames D Nieland  To: Earney HamburgMatthew T Keaton, NP  Sent: 06/11/2015 9:17 AM EST  Subject:  Prescription Question    [phone]  -----  Message -----  From:  Earney HamburgMatthew T Keaton, NP  Sent:  06/11/2015 8:07 AM EST  To:  Alexander Howell  Subject:  RE: Prescription Question    I  printed a rx for the supplies, it will be at the office for you to pick up, or message me the fax number of medical supply store to send it too.     -----  Message -----    From: Alexander Howell    Sent: 06/08/2015 2:07 PM EST    To: Earney HamburgMatthew T Keaton, NP  Subject:  Prescription Question    I  need a prescription for a cpap mask, headgear and hose. Here are the part numbers and specifics if you need them.       Product Unit  Price Quantity Total  AirFit???  N20 Nasal CPAP Mask with Headgear - Medium $99.00 1 $99.00  Return  Insurance For AirFit??? N20 Nasal CPAP Mask with Headgear $0.00 1 $0.00  White  6 Foot Performance 19mm Tubing with 22mm Easy Grip Cuffs $12.95 1 $12.95

## 2015-06-12 NOTE — Telephone Encounter (Signed)
From: Bethanne GingerJames D Kolodziejski  To: Earney HamburgMatthew T Keaton, NP  Sent: 06/12/2015 9:31 AM EST  Subject:  Prescription Question    Fax  it again and reference order number [id number]  -----  Message -----  From:  Earney HamburgMatthew T Keaton, NP  Sent:  06/12/2015 9:09 AM EST  To:  Bethanne GingerJames D Soberano  Subject:  RE: Prescription Question    I  unfortunatley can't attach a rx to an email. Either you can come pick it up from office, or we can call the medial supply store and try to send again, and confirm the fax number. What is the name of the medical supply store? And phone number?    -----  Message -----    From: Bethanne GingerJames D Carreto    Sent: 06/12/2015 7:24 AM EST    To: Earney HamburgMatthew T Keaton, NP  Subject:  RE: Prescription Question    Can  you email me a copy of the prescription? They cannot find it. I can send it to them via email attached to my order confirmation. Thanks.  -----  Message -----  From:  Chaya JanValencia S Wilson, Certified Medical Assistant  Sent:  06/11/2015 9:24 AM EST  To:  Bethanne GingerJames D Viscuso  Subject:  RE: Prescription Question    Faxing  now.      -----  Message -----    From: Bethanne GingerJames D Vinsant    Sent: 06/11/2015 9:17 AM EST    To: Earney HamburgMatthew T Keaton, NP  Subject:  Prescription Question    [phone]  -----  Message -----  From:  Earney HamburgMatthew T Keaton, NP  Sent:  06/11/2015 8:07 AM EST  To:  Bethanne GingerJames D Mendez  Subject:  RE: Prescription Question    I  printed a rx for the supplies, it will be at the office for you to pick up, or message me the fax number of medical supply store to send it too.     -----  Message -----    From: Bethanne GingerJames D Ahmed    Sent: 06/08/2015 2:07 PM EST    To: Earney HamburgMatthew T Keaton, NP  Subject:  Prescription Question    I  need a prescription for a cpap mask, headgear and hose. Here are the part numbers and specifics if you need them.       Product Unit  Price Quantity Total  AirFit???  N20 Nasal CPAP Mask with Headgear - Medium $99.00 1 $99.00  Return  Insurance For AirFit??? N20 Nasal CPAP Mask with Headgear $0.00 1 $0.00   White  6 Foot Performance 19mm Tubing with 22mm Easy Grip Cuffs $12.95 1 $12.95

## 2015-06-12 NOTE — Telephone Encounter (Signed)
Is there another option?

## 2015-06-12 NOTE — Telephone Encounter (Deleted)
What is the name of the supply store and phone number? Just to confirm their fax number.

## 2015-08-14 MED ORDER — MELOXICAM 15 MG TAB
15 mg | ORAL_TABLET | ORAL | 0 refills | Status: DC
Start: 2015-08-14 — End: 2015-12-20

## 2015-08-21 NOTE — Telephone Encounter (Signed)
From: Bethanne Ginger  To: Earney Hamburg, NP  Sent: 08/21/2015 11:51 AM EST  Subject:  Non-Urgent Medical Question    When  should I come see you for a cold? I'm not running a fever but I cannot get rid of the congestion. Sinus and chest congestion. It is impossible to sleep much because of the congestion. This has lingered for about a week and a half now and has gotten worse over the last day and a half. Is there any prescription that you could call in to help with the congestion. I'm taking mucinex and drinking a lot of water which is what you had me do a couple years ago when I had similar symptoms. Thanks.

## 2015-08-22 ENCOUNTER — Ambulatory Visit: Admit: 2015-08-22 | Discharge: 2015-08-22 | Payer: BLUE CROSS/BLUE SHIELD | Attending: Family | Primary: Family

## 2015-08-22 DIAGNOSIS — J019 Acute sinusitis, unspecified: Secondary | ICD-10-CM

## 2015-08-22 MED ORDER — AMOXICILLIN CLAVULANATE 875 MG-125 MG TAB
875-125 mg | ORAL_TABLET | Freq: Two times a day (BID) | ORAL | 0 refills | Status: AC
Start: 2015-08-22 — End: 2015-09-01

## 2015-08-22 MED ORDER — PREDNISONE 5 MG TABLETS IN A DOSE PACK
5 mg | ORAL_TABLET | ORAL | 0 refills | Status: DC
Start: 2015-08-22 — End: 2016-05-23

## 2015-08-22 NOTE — Progress Notes (Signed)
Chief Complaint   Patient presents with   ??? Other     heavy congestion in chest and nose for over a week and a half. Pt stated it comes and goes. Also experiencing nausea and drainage.        Alexander Howell is a 45 y.o. male        HPI    States that has been having heavy congestion in chest and nose for over a week and a half.  States that will come and go. States that every 3rd day feel like getting better.  States that getting nausea with drainage. Has gotten worse the last 3 days.  States that can't sleeep because of the congestion, and will feel drainage in chest.  States that only getting about2 hours for last 3 days.  Has slight fever.     Past Medical History   Diagnosis Date   ??? Allergic rhinitis, cause unspecified    ??? Essential hypertension, benign    ??? Mixed hyperlipidemia    ??? Sleep apnea        Social History     Social History   ??? Marital status: MARRIED     Spouse name: N/A   ??? Number of children: N/A   ??? Years of education: N/A     Occupational History   ??? Not on file.     Social History Main Topics   ??? Smoking status: Never Smoker   ??? Smokeless tobacco: Never Used   ??? Alcohol use 0.0 oz/week     0 Standard drinks or equivalent per week      Comment: monthly or less; 1-2 drinks   ??? Drug use: Not on file   ??? Sexual activity: Not on file     Other Topics Concern   ??? Not on file     Social History Narrative       Family History   Problem Relation Age of Onset   ??? Hypertension Mother    ??? High Cholesterol Mother    ??? Hypertension Father    ??? High Cholesterol Father    ??? Diabetes Maternal Aunt    ??? Heart Disease Paternal Uncle    ??? Heart Disease Paternal Grandfather        Current Outpatient Prescriptions   Medication Sig Dispense Refill   ??? meloxicam (MOBIC) 15 mg tablet TAKE ONE TABLET BY MOUTH ONCE DAILY 30 Tab 0   ??? cpap machine kit Product Unit Price Quantity Total   AirFit??? N20 Nasal CPAP Mask with Headgear - Medium $99.00 1 $99.00    Return Insurance For AirFit??? N20 Nasal CPAP Mask with Headgear $0.00 1 $0.00   White 6 Foot Performance 54m Tubing with 271mEasy Grip Cuffs $12.95 1 $12.95 1 Kit 0   ??? atorvastatin (LIPITOR) 40 mg tablet Take 1 Tab by mouth daily. 90 Tab 3   ??? lisinopril (PRINIVIL, ZESTRIL) 5 mg tablet Take 1 Tab by mouth daily. 90 Tab 3   ??? fluticasone (FLONASE) 50 mcg/actuation nasal spray 2 Sprays by Both Nostrils route daily. 3 Bottle 3   ??? cpap machine kit Needs new mask, hose, head gear, and filter for cpap therapy. 1 Kit 0         Ros- see hpi      Visit Vitals   ??? BP 132/74 (BP 1 Location: Right arm, BP Patient Position: Sitting)   ??? Temp 99.5 ??F (37.5 ??C) (Tympanic)   ??? Resp 16   ???  Ht _0  (1.727 m)   ??? Wt 223 lb (101.2 kg)   ??? BMI 33.91 kg/m2           Physical Examination: General appearance - alert, well appearing, and in no distress and oriented to person, place, and time  Mental status - alert, oriented to person, place, and time, normal mood, behavior, speech, dress, motor activity, and thought processes  Eyes - pupils equal and reactive, extraocular eye movements intact  Ears - bilateral TM's and external ear canals normal  Nose -swelling, redness noted bilateral nostrils.   Mouth - mucous membranes moist, pharynx normal without lesions  Neck - supple, no significant adenopathy  Lymphatics - no palpable lymphadenopathy, no hepatosplenomegaly  Chest - clear to auscultation, no wheezes, rales or rhonchi, symmetric air entry  Heart - normal rate, regular rhythm, normal S1, S2, no murmurs, rubs, clicks or gallops  Abdomen - soft, nontender, nondistended, no masses or organomegaly  Neurological - alert, oriented, normal speech, no focal findings or movement disorder noted  Musculoskeletal - no joint tenderness, deformity or swelling  Skin - normal coloration and turgor, no rashes, no suspicious skin lesions noted        .  Assessment and plan:        ICD-10-CM ICD-9-CM     1. Acute sinusitis, recurrence not specified, unspecified location J01.90 461.9 amoxicillin-clavulanate (AUGMENTIN) 875-125 mg per tablet      predniSONE (STERAPRED) 5 mg dose pack     Will use augmnetin, and prednisone, patient instructed on use. If no better in 7-10 days let me know.

## 2015-11-19 MED ORDER — CYCLOBENZAPRINE 10 MG TAB
10 mg | ORAL_TABLET | Freq: Three times a day (TID) | ORAL | 1 refills | Status: DC | PRN
Start: 2015-11-19 — End: 2017-12-03

## 2015-11-19 NOTE — Telephone Encounter (Signed)
From: Bethanne GingerJames D Tallman  To: Earney HamburgMatthew T Sarha Bartelt, NP  Sent: 11/16/2015 2:32 PM EDT  Subject:  Prescription Question    I  need a refil on my flexeril generic.

## 2015-12-20 MED ORDER — MELOXICAM 15 MG TAB
15 mg | ORAL_TABLET | ORAL | 0 refills | Status: DC
Start: 2015-12-20 — End: 2016-07-10

## 2016-01-23 ENCOUNTER — Emergency Department (HOSPITAL_COMMUNITY): Payer: 59

## 2016-01-23 ENCOUNTER — Encounter (HOSPITAL_COMMUNITY): Payer: Self-pay | Admitting: Emergency Medicine

## 2016-01-23 ENCOUNTER — Emergency Department (HOSPITAL_COMMUNITY)
Admission: EM | Admit: 2016-01-23 | Discharge: 2016-01-23 | Disposition: A | Discharge status: Home / Self Care | Payer: 59 | Attending: Emergency Medicine | Admitting: Emergency Medicine

## 2016-01-23 ENCOUNTER — Other Ambulatory Visit: Payer: Self-pay | Admitting: Surgery

## 2016-01-23 DIAGNOSIS — R1013 Epigastric pain: Secondary | ICD-10-CM | POA: Diagnosis present

## 2016-01-23 DIAGNOSIS — K8020 Calculus of gallbladder without cholecystitis without obstruction: Secondary | ICD-10-CM | POA: Insufficient documentation

## 2016-01-23 LAB — COMPREHENSIVE METABOLIC PANEL
ALT: 52 U/L (ref 17–63)
AST: 35 U/L (ref 15–41)
Albumin: 4.8 g/dL (ref 3.5–5.0)
Alkaline Phosphatase: 81 U/L (ref 38–126)
Anion gap: 7 (ref 5–15)
BUN: 18 mg/dL (ref 6–20)
CO2: 26 mmol/L (ref 22–32)
Calcium: 8.9 mg/dL (ref 8.9–10.3)
Chloride: 107 mmol/L (ref 101–111)
Creatinine, Ser: 1.52 mg/dL — ABNORMAL HIGH (ref 0.61–1.24)
GFR calc Af Amer: >60 mL/min (ref >60)
GFR calc non Af Amer: 53 mL/min — ABNORMAL LOW (ref >60)
Glucose, Bld: 149 mg/dL — ABNORMAL HIGH (ref 65–99)
Potassium: 4 mmol/L (ref 3.5–5.1)
Sodium: 140 mmol/L (ref 135–145)
Total Bilirubin: 1.3 mg/dL — ABNORMAL HIGH (ref 0.3–1.2)
Total Protein: 7.5 g/dL (ref 6.5–8.1)

## 2016-01-23 LAB — CBC
HCT: 43.8 % (ref 39.0–52.0)
Hemoglobin: 15.3 g/dL (ref 13.0–17.0)
MCH: 31.7 pg (ref 26.0–34.0)
MCHC: 34.9 g/dL (ref 30.0–36.0)
MCV: 90.7 fL (ref 78.0–100.0)
Platelets: 227 10*3/uL (ref 150–400)
RBC: 4.83 MIL/uL (ref 4.22–5.81)
RDW: 12.6 % (ref 11.5–15.5)
WBC: 8.9 10*3/uL (ref 4.0–10.5)

## 2016-01-23 LAB — URINALYSIS, ROUTINE W REFLEX MICROSCOPIC
Bilirubin Urine: NEGATIVE
Glucose, UA: NEGATIVE mg/dL
Hgb urine dipstick: NEGATIVE
Ketones, ur: NEGATIVE mg/dL
Leukocytes, UA: NEGATIVE
Nitrite: NEGATIVE
Protein, ur: NEGATIVE mg/dL
Specific Gravity, Urine: 1.025 (ref 1.005–1.030)
pH: 6.5 (ref 5.0–8.0)

## 2016-01-23 LAB — LIPASE, BLOOD: Lipase: 27 U/L (ref 11–51)

## 2016-01-23 IMAGING — CT CT ABD-PELV W/ CM
2 of 5 series · 16 of 46 positions shown, 18 images · IV contrast (ISOVUE)
Comparison: None.

CLINICAL DATA: Intermittent epigastric pain for 3 days.

EXAM:
CT ABDOMEN AND PELVIS WITH CONTRAST
TECHNIQUE: Multidetector CT imaging of the abdomen and pelvis was performed
using the standard protocol following bolus administration of
intravenous contrast.
CONTRAST:  100mL [WT] IOPAMIDOL ([WT]) INJECTION 61%

[Series 2: abd/pel with · axial · 0.74mm/px · z∈[+1074,+1484]mm · 13 of 94 slices shown, 15 images]
[im 6/94  soft-tissue]
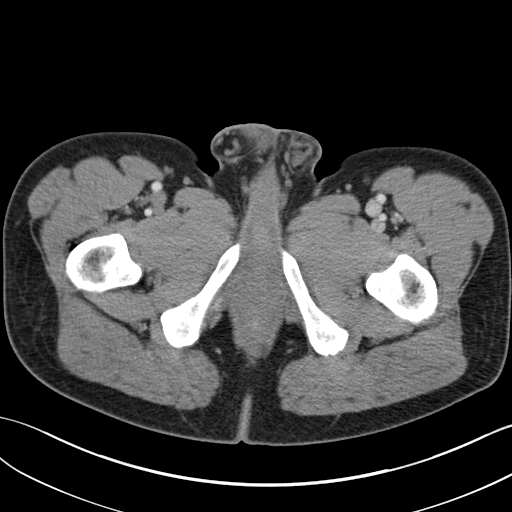
[im 6/94  bone]
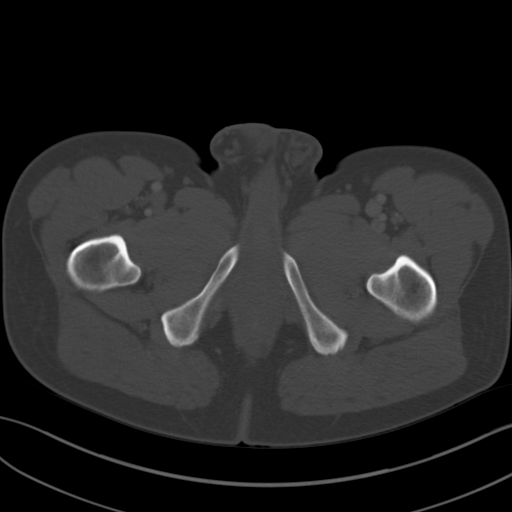
[im 11/94  soft-tissue]
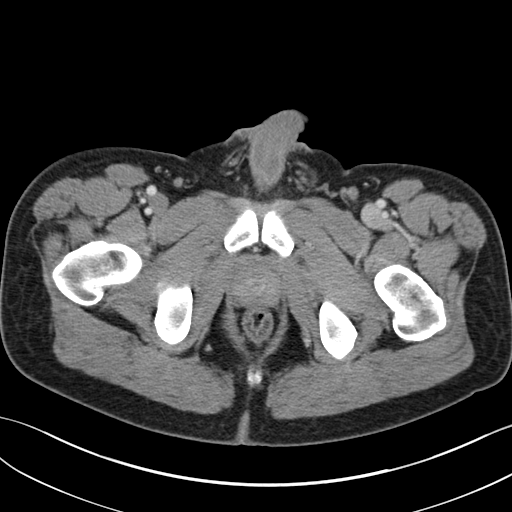
[im 22/94  soft-tissue]
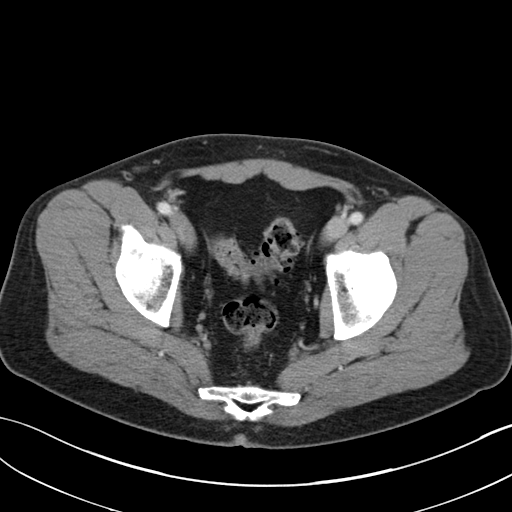
[im 28/94  soft-tissue]
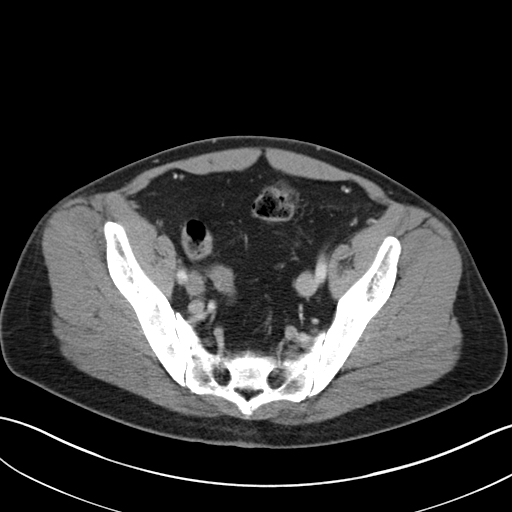
[im 33/94  soft-tissue]
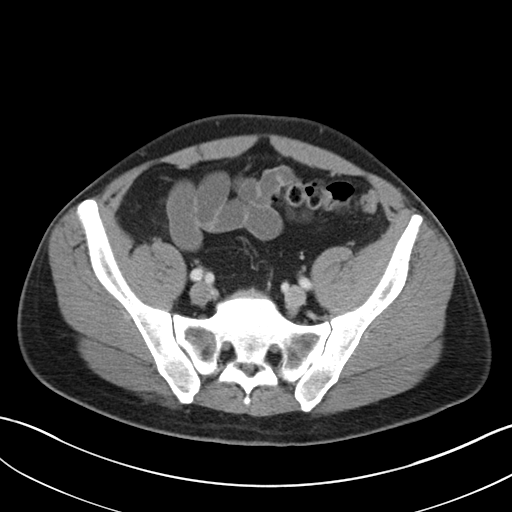
[im 39/94  soft-tissue]
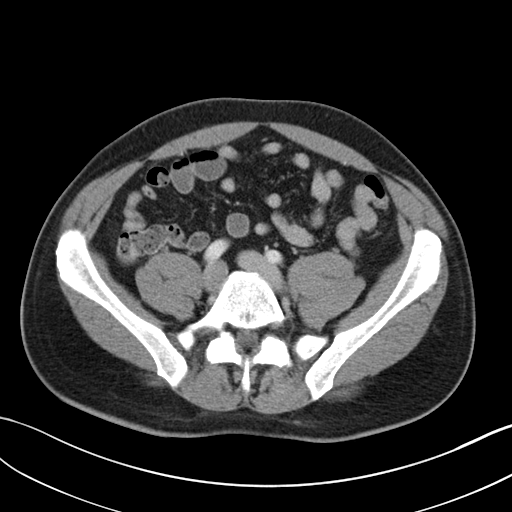
[im 50/94  soft-tissue]
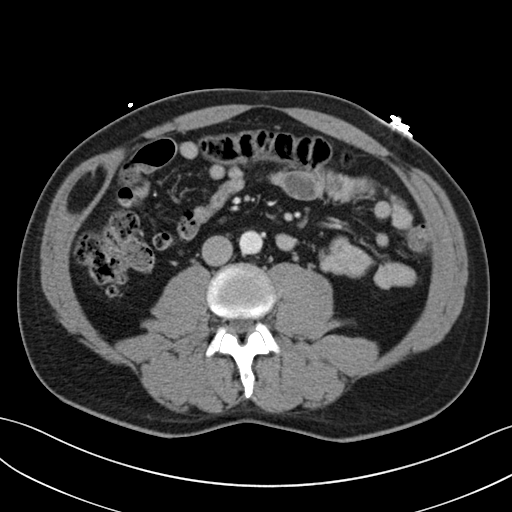
[im 55/94  soft-tissue]
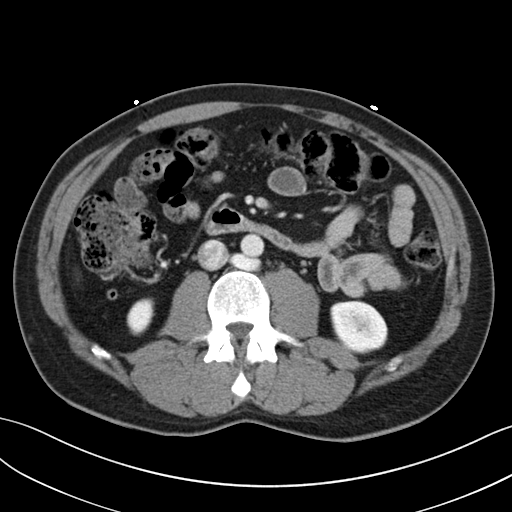
[im 61/94  soft-tissue]
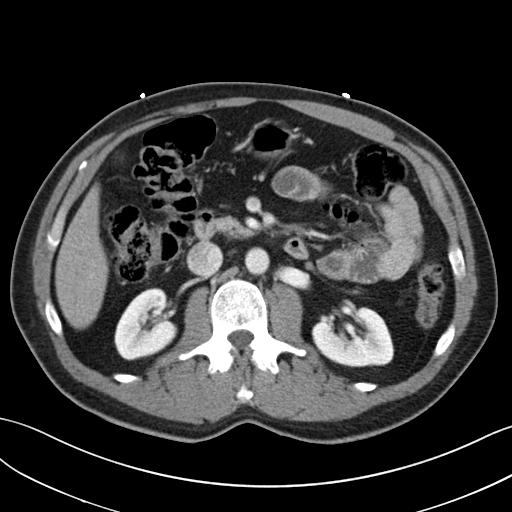
[im 61/94  bone]
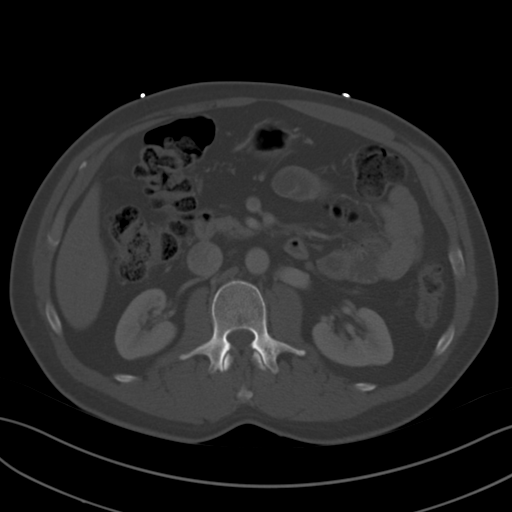
[im 66/94  soft-tissue]
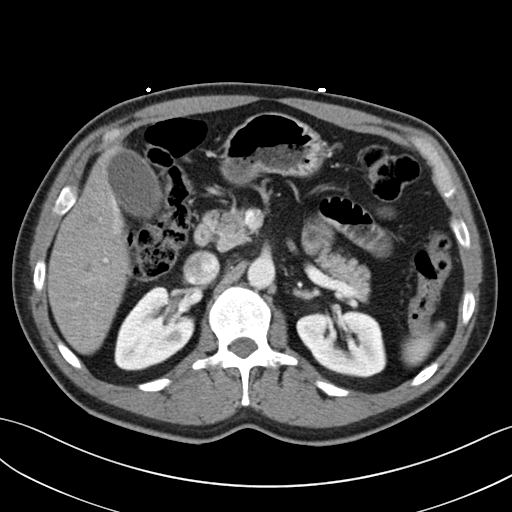
[im 72/94  soft-tissue]
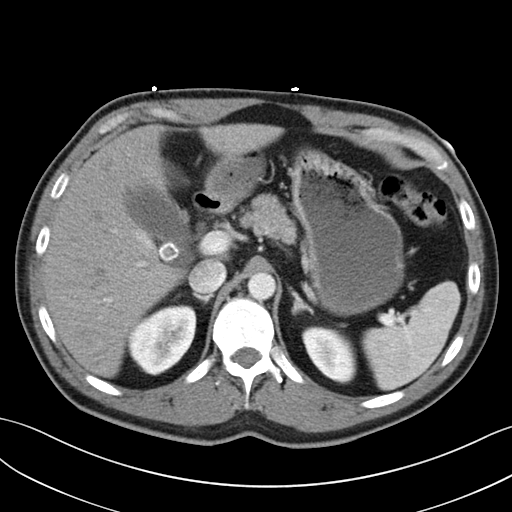
[im 83/94  soft-tissue]
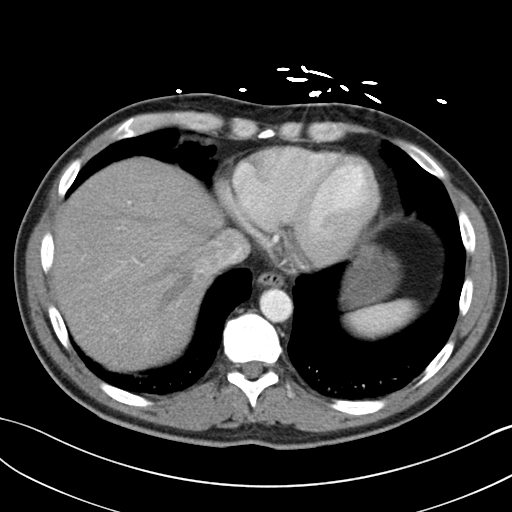
[im 88/94  soft-tissue]
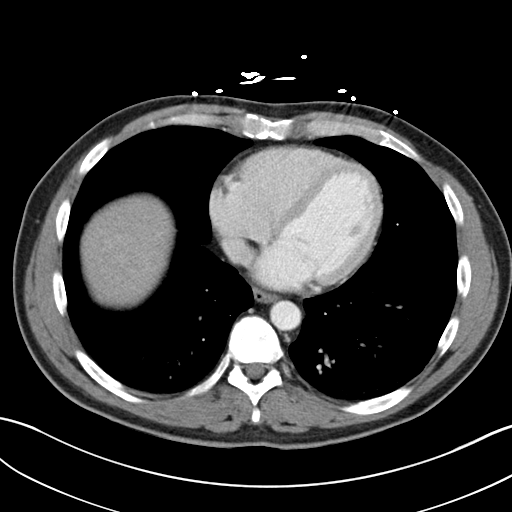

[Series 4: coronal a/|p · coronal · 0.68mm/px · 3 of 123 slices shown]
[im 41/123  soft-tissue]
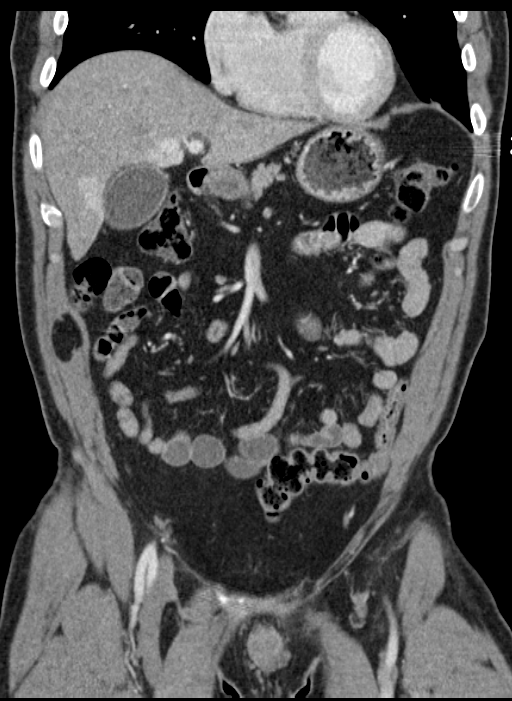
[im 55/123  soft-tissue]
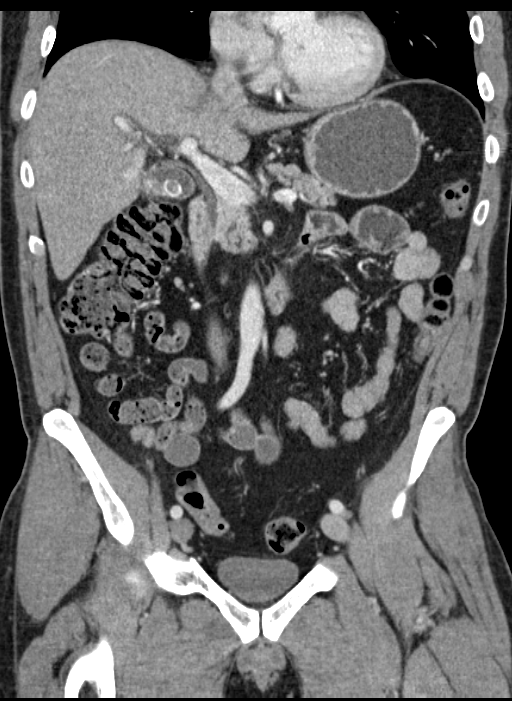
[im 68/123  soft-tissue]
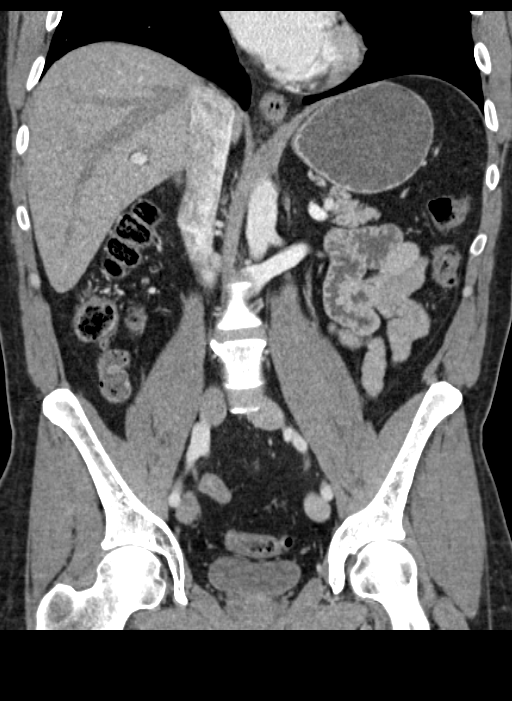

[16 of 46 positions shown; findings below may reference images not displayed]

FINDINGS: Normal lung bases.

No free air or free fluid. Multiple stones are seen in the
gallbladder with no wall thickening. There does appear to be a small
amount of pericholecystic fluid as well as a small amount of fat
stranding adjacent to the gallbladder, best seen on coronal images.
Several cysts are seen in the liver. High attenuation is seen
adjacent to the gallbladder fossa, likely representing focal fatty
deposition. There is hepatic steatosis seen elsewhere in the liver.
Mild nonspecific periportal edema is identified. The portal vein,
spleen, adrenal glands, left kidney, and pancreas are normal.
Probable small cysts in the right kidney which is otherwise normal
in appearance. The abdominal aorta is normal in appearance with no
aneurysm or dissection. No adenopathy. The stomach and small bowel
are normal. The colon and appendix are unremarkable. There is a
lipoma in the musculature of the right lateral abdominal wall.

The pelvis demonstrates no adenopathy or mass. The prostate, seminal
vesicles, and bladder are normal. Visualized bones are unremarkable.
Delayed images demonstrate no filling defects in the upper renal
collecting systems.
IMPRESSION: 1. The findings are most consistent with acute cholecystitis.
Recommend clinical correlation. An ultrasound could further evaluate
if clinically warranted.

## 2016-01-23 MED ORDER — SODIUM CHLORIDE 0.9 % IV BOLUS (SEPSIS)
1000.0000 mL | Freq: Once | INTRAVENOUS | Status: AC
  Administered 2016-01-23: 1000 mL via INTRAVENOUS

## 2016-01-23 MED ORDER — GI COCKTAIL ~~LOC~~
30.0000 mL | Freq: Once | ORAL | Status: AC
  Administered 2016-01-23: 30 mL via ORAL
  Filled 2016-01-23: qty 30

## 2016-01-23 MED ORDER — ONDANSETRON HCL 4 MG/2ML IJ SOLN
4.0000 mg | Freq: Once | INTRAMUSCULAR | Status: AC | PRN
  Administered 2016-01-23: 4 mg via INTRAVENOUS
  Filled 2016-01-23: qty 2

## 2016-01-23 MED ORDER — KETOROLAC TROMETHAMINE 15 MG/ML IJ SOLN
15.0000 mg | Freq: Once | INTRAMUSCULAR | Status: AC
  Administered 2016-01-23: 15 mg via INTRAVENOUS
  Filled 2016-01-23: qty 1

## 2016-01-23 MED ORDER — HYDROMORPHONE HCL 1 MG/ML IJ SOLN
0.5000 mg | Freq: Once | INTRAMUSCULAR | Status: AC
  Administered 2016-01-23: 0.5 mg via INTRAVENOUS
  Filled 2016-01-23: qty 1

## 2016-01-23 MED ORDER — OXYCODONE-ACETAMINOPHEN 5-325 MG PO TABS: 1.0000 | ORAL_TABLET | ORAL | Status: DC | PRN

## 2016-01-23 MED ORDER — IOPAMIDOL (ISOVUE-300) INJECTION 61%
100.0000 mL | Freq: Once | INTRAVENOUS | Status: AC | PRN
  Administered 2016-01-23: 100 mL via INTRAVENOUS

## 2016-01-23 MED ORDER — ONDANSETRON HCL 4 MG PO TABS: 4.0000 mg | ORAL_TABLET | Freq: Four times a day (QID) | ORAL | Status: DC

## 2016-01-23 MED ORDER — FAMOTIDINE IN NACL 20-0.9 MG/50ML-% IV SOLN
20.0000 mg | Freq: Once | INTRAVENOUS | Status: AC
  Administered 2016-01-23: 20 mg via INTRAVENOUS
  Filled 2016-01-23: qty 50

## 2016-01-23 NOTE — ED Notes (Signed)
Pt states he is unable to void at this time.

## 2016-01-23 NOTE — ED Notes (Signed)
Pt states that he experienced epigastric pain this weekend that has come back tonight and will not go away with OTC meds. Alert and oriented. Nausea.

## 2016-01-23 NOTE — ED Provider Notes (Signed)
CSN: [id number]     Arrival date & time 01/23/16  0013 History  By signing my name below, I, Raymond Kelly, attest that this documentation has been prepared under the direction and in the presence of Virgel Manifold, MD. Electronically Signed: Judithann Kelly, ED Scribe. 01/23/2016. 1:20 AM.    Chief Complaint  Patient presents with  . Abdominal Pain   The history is provided by the patient. No language interpreter was used.   HPI Comments: Raymond Kelly is a 45 y.o. male who presents to the Emergency Department complaining of gradually worsening intermittent episodes of non-radiating moderate epigastric pain onset 3 days ago. He notes that he was relieved of the pain for 2 days but that pain returned and has been constant since 9 pm yesterday. He reports associated nausea. No alleviating factors noted. Pt has tried OTC medications with no relief. He denies any hx of abdominal surgeries or any similar episodes. He reports a family hx of gallstones with his mother but denies a personal hx of issues with his gallbladder. He denies any frequent NSAID use or ETOH use. He reports NKDA. He denies any fever, chills, vomiting, diarrhea, or SOB.    No past medical history on file. Past Surgical History  Procedure Laterality Date  . Ulnar nerve transposition     No family history on file. Social History  Substance Use Topics  . Smoking status: Never Smoker   . Smokeless tobacco: Not on file  . Alcohol Use: No    Review of Systems  Constitutional: Negative for fever and chills.  Respiratory: Negative for shortness of breath.   Gastrointestinal: Positive for nausea and abdominal pain. Negative for vomiting and diarrhea.  All other systems reviewed and are negative.     Allergies  Review of patient's allergies indicates no known allergies.  Home Medications   Prior to Admission medications   Medication Sig Start Date End Date Taking? Authorizing Provider  bismuth subsalicylate (PEPTO  BISMOL) 262 MG chewable tablet Chew 524 mg by mouth as needed for indigestion.   Yes Historical Provider, MD   BP 164/92 mmHg  Pulse 44  Temp(Src) 97.7 F (36.5 C) (Oral)  Resp 18  SpO2 100% Physical Exam  Constitutional: He appears well-developed and well-nourished. No distress.  HENT:  Head: Normocephalic and atraumatic.  Right Ear: External ear normal.  Left Ear: External ear normal.  Eyes: Conjunctivae are normal. Right eye exhibits no discharge. Left eye exhibits no discharge. No scleral icterus.  Neck: Neck supple. No tracheal deviation present.  Cardiovascular: Normal rate, regular rhythm and intact distal pulses.   Pulmonary/Chest: Effort normal and breath sounds normal. No stridor. No respiratory distress. He has no wheezes. He has no rales.  Abdominal: Soft. Bowel sounds are normal. He exhibits no distension. There is tenderness. There is no rebound and no guarding.  Epigastrium tenderness  Musculoskeletal: He exhibits no edema or tenderness.  Neurological: He is alert. He has normal strength. No cranial nerve deficit (no facial droop, extraocular movements intact, no slurred speech) or sensory deficit. He exhibits normal muscle tone. He displays no seizure activity. Coordination normal.  Skin: Skin is warm and dry. No rash noted.  Psychiatric: He has a normal mood and affect.  Nursing note and vitals reviewed.   ED Course  Procedures (including critical care time) DIAGNOSTIC STUDIES: Oxygen Saturation is 100% on RA, normal by my interpretation.    COORDINATION OF CARE: 1:08 AM- Pt advised of plan for treatment and pt agrees.  Pt will receive lab work and CT scan for further evaluation. He will receive Zofran and pain medications.    Labs Review Labs Reviewed  COMPREHENSIVE METABOLIC PANEL - Abnormal; Notable for the following:    Glucose, Bld 149 (*)    Creatinine, Ser 1.52 (*)    Total Bilirubin 1.3 (*)    GFR calc non Af Amer 53 (*)    All other components within  normal limits  LIPASE, BLOOD  CBC  URINALYSIS, ROUTINE W REFLEX MICROSCOPIC (NOT AT [facility])    Imaging Review Ct Abdomen Pelvis W Contrast  01/23/2016  CLINICAL DATA:  Intermittent epigastric pain for 3 days. EXAM: CT ABDOMEN AND PELVIS WITH CONTRAST TECHNIQUE: Multidetector CT imaging of the abdomen and pelvis was performed using the standard protocol following bolus administration of intravenous contrast. CONTRAST:  136mL ISOVUE-300 IOPAMIDOL (ISOVUE-300) INJECTION 61% COMPARISON:  None. FINDINGS: Normal lung bases. No free air or free fluid. Multiple stones are seen in the gallbladder with no wall thickening. There does appear to be a small amount of pericholecystic fluid as well as a small amount of fat stranding adjacent to the gallbladder, best seen on coronal images. Several cysts are seen in the liver. High attenuation is seen adjacent to the gallbladder fossa, likely representing focal fatty deposition. There is hepatic steatosis seen elsewhere in the liver. Mild nonspecific periportal edema is identified. The portal vein, spleen, adrenal glands, left kidney, and pancreas are normal. Probable small cysts in the right kidney which is otherwise normal in appearance. The abdominal aorta is normal in appearance with no aneurysm or dissection. No adenopathy. The stomach and small bowel are normal. The colon and appendix are unremarkable. 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 The pelvis demonstrates no adenopathy or mass. The prostate, seminal vesicles, and bladder are normal. Visualized bones are unremarkable. Delayed images demonstrate no filling defects in the upper renal collecting systems. IMPRESSION: 1. The findings are most consistent with acute cholecystitis. Recommend clinical correlation. An ultrasound could further evaluate if clinically warranted. Electronically Signed   By: Dorise Bullion III M.D   On: 01/23/2016 02:52     Virgel Manifold, MD has personally  reviewed and evaluated these images and lab results as part of his medical decision-making.   EKG Interpretation   Date/Time:  Wednesday January 23 2016 00:21:25 EDT Ventricular Rate:  41 PR Interval:    QRS Duration: 106 QT Interval:  454 QTC Calculation: 375 R Axis:   63 Text Interpretation:  Sinus bradycardia Baseline wander in lead(s) V5 no  acute changes  Confirmed by LIU MD, DANA [id number]) on 01/23/2016 12:29:03 AM      MDM   Final diagnoses:  Calculus of gallbladder without cholecystitis without obstruction    45yM with epigastric pain. Imaging noted. Clinically doubt cholecystitis. Pain now done. Afebrile. No leukocytosis, FLTs ok. Will DC with PRn pain meds. Surgery FU.  I personally preformed the services scribed in my presence. The recorded information has been reviewed is accurate. Virgel Manifold, MD.   Virgel Manifold, MD 02/07/16 2158

## 2016-01-23 NOTE — Discharge Instructions (Signed)
Biliary Colic °Biliary colic is a pain in the upper abdomen. The pain: °· Is usually felt on the right side of the abdomen, but it may also be felt in the center of the abdomen, just below the breastbone (sternum). °· May spread back toward the right shoulder blade. °· May be steady or irregular. °· May be accompanied by nausea and vomiting. °Most of the time, the pain goes away in 1-5 hours. After the most intense pain passes, the abdomen may continue to ache mildly for about 24 hours. °Biliary colic is caused by a blockage in the bile duct. The bile duct is a pathway that carries bile--a liquid that helps to digest fats--from the gallbladder to the small intestine. Biliary colic usually occurs after eating, when the digestive system demands bile. The pain develops when muscle cells contract forcefully to try to move the blockage so that bile can get by. °HOME CARE INSTRUCTIONS °· Take medicines only as directed by your health care provider. °· Drink enough fluid to keep your urine clear or pale yellow. °· Avoid fatty, greasy, and fried foods. These kinds of foods increase your body's demand for bile. °· Avoid any foods that make your pain worse. °· Avoid overeating. °· Avoid having a large meal after fasting. °SEEK MEDICAL CARE IF: °· You develop a fever. °· Your pain gets worse. °· You vomit. °· You develop nausea that prevents you from eating and drinking. °SEEK IMMEDIATE MEDICAL CARE IF: °· You suddenly develop a fever and shaking chills. °· You develop a yellowish discoloration (jaundice) of: °· Skin. °· Whites of the eyes. °· Mucous membranes. °· You have continuous or severe pain that is not relieved with medicines. °· You have nausea and vomiting that is not relieved with medicines. °· You develop dizziness or you faint. °  °This information is not intended to replace advice given to you by your health care provider. Make sure you discuss any questions you have with your health care provider. °  °Document  Released: 11/24/2005 Document Revised: 11/07/2014 Document Reviewed: 04/04/2014 °Elsevier Interactive Patient Education ©2016 Else[employer] ° °Cholelithiasis °Cholelithiasis (also called gallstones) is a form of gallbladder disease in which gallstones form in your gallbladder. The gallbladder is an organ that stores bile made in the liver, which helps digest fats. Gallstones begin as small crystals and slowly grow into stones. Gallstone pain occurs when the gallbladder spasms and a gallstone is blocking the duct. Pain can also occur when a stone passes out of the duct.  °RISK FACTORS °· Being male.   °· Having multiple pregnancies. Health care providers sometimes advise removing diseased gallbladders before future pregnancies.   °· Being obese. °· Eating a diet heavy in fried foods and fat.   °· Being older than 60 years and increasing age.   °· Prolonged use of medicines containing male hormones.   °· Having diabetes mellitus.   °· Rapidly losing weight.   °· Having a family history of gallstones (heredity).   °SYMPTOMS °· Nausea.   °· Vomiting. °· Abdominal pain.   °· Yellowing of the skin (jaundice).   °· Sudden pain. It may persist from several minutes to several hours. °· Fever.   °· Tenderness to the touch.  °In some cases, when gallstones do not move into the bile duct, people have no pain or symptoms. These are called "silent" gallstones.  °TREATMENT °Silent gallstones do not need treatment. In severe cases, emergency surgery may be required. Options for treatment include: °· Surgery to remove the gallbladder. This is the most common treatment. °·   Medicines. These do not always work and may take 6-12 months or more to work.  Shock wave treatment (extracorporeal biliary lithotripsy). In this treatment an ultrasound machine sends shock waves to the gallbladder to break gallstones into smaller pieces that can pass into the intestines or be dissolved by medicine. HOME CARE INSTRUCTIONS   Only take  over-the-counter or prescription medicines for pain, discomfort, or fever as directed by your health care provider.   Follow a low-fat diet until seen again by your health care provider. Fat causes the gallbladder to contract, which can result in pain.   Follow up with your health care provider as directed. Attacks are almost always recurrent and surgery is usually required for permanent treatment.  SEEK IMMEDIATE MEDICAL CARE IF:   Your pain increases and is not controlled by medicines.   You have a fever or persistent symptoms for more than 2-3 days.   You have a fever and your symptoms suddenly get worse.   You have persistent nausea and vomiting.  MAKE SURE YOU:   Understand these instructions.  Will watch your condition.  Will get help right away if you are not doing well or get worse.   This information is not intended to replace advice given to you by your health care provider. Make sure you discuss any questions you have with your health care provider.   Document Released: 06/19/2005 Document Revised: 02/23/2013 Document Reviewed: 12/15/2012 Elsevier Interactive Patient Education [employer].

## 2016-01-28 NOTE — Pre-Procedure Instructions (Signed)
Raymond Kelly [facility]  01/28/2016      [id number] - [place], [place] - [address] [place] [address] Phone: [phone] Fax: [phone]    Your procedure is scheduled on July 28  Report to [facility] at 1030 A.M.  Call this number if you have problems the morning of surgery:  [phone]   Remember:  Do not eat food or drink liquids after midnight.  Take these medicines the morning of surgery with A SIP OF WATER Ondansetron (Zofran) if needed and Oxycodone-acetaminophen (Percocet) if needed   Do not wear jewelry, make-up or nail polish.  Do not wear lotions, powders, or perfumes.  You may wear deoderant.  Do not shave 48 hours prior to surgery.  Men may shave face and neck.  Do not bring valuables to the hospital.  [facility] is not responsible for any belongings or valuables.  Contacts, dentures or bridgework may not be worn into surgery.  Leave your suitcase in the car.  After surgery it may be brought to your room.  For patients admitted to the hospital, discharge time will be determined by your treatment team.  Patients discharged the day of surgery will not be allowed to drive home.    Special instructions:  [facility] - Preparing for Surgery  Before surgery, you can play an important role.  Because skin is not sterile, your skin needs to be as free of germs as possible.  You can reduce the number of germs on you skin by washing with CHG (chlorahexidine gluconate) soap before surgery.  CHG is an antiseptic cleaner which kills germs and bonds with the skin to continue killing germs even after washing.  Please DO NOT use if you have an allergy to CHG or antibacterial soaps.  If your skin becomes reddened/irritated stop using the CHG and inform your nurse when you arrive at Short Stay.  Do not shave (including legs and underarms) for at least 48 hours prior to the first CHG shower.   You may shave your face.  Please follow these instructions carefully:   1.  Shower with CHG Soap the night before surgery and the                                morning of Surgery.  2.  If you choose to wash your hair, wash your hair first as usual with your       normal shampoo.  3.  After you shampoo, rinse your hair and body thoroughly to remove the                      Shampoo.  4.  Use CHG as you would any other liquid soap.  You can apply chg directly       to the skin and wash gently with scrungie or a clean washcloth.  5.  Apply the CHG Soap to your body ONLY FROM THE NECK DOWN.        Do not use on open wounds or open sores.  Avoid contact with your eyes,       ears, mouth and genitals (private parts).  Wash genitals (private parts)       with your normal soap.  6.  Wash thoroughly, paying special attention to the area where your surgery  will be performed.  7.  Thoroughly rinse your body with warm water from the neck down.  8.  DO NOT shower/wash with your normal soap after using and rinsing off       the CHG Soap.  9.  Pat yourself dry with a clean towel.            10.  Wear clean pajamas.            11.  Place clean sheets on your bed the night of your first shower and do not        sleep with pets.  Day of Surgery  Do not apply any lotions/deoderants the morning of surgery.  Please wear clean clothes to the hospital/surgery center.     Please read over the following fact sheets that you were given. Pain Booklet, Coughing and Deep Breathing and Surgical Site Infection Prevention

## 2016-01-29 ENCOUNTER — Encounter (HOSPITAL_COMMUNITY)
Admission: RE | Admit: 2016-01-29 | Discharge: 2016-01-29 | Disposition: A | Discharge status: Home / Self Care | Payer: 59 | Source: Ambulatory Visit | Attending: Surgery | Admitting: Surgery

## 2016-01-29 ENCOUNTER — Encounter (HOSPITAL_COMMUNITY): Payer: Self-pay

## 2016-01-29 DIAGNOSIS — R7989 Other specified abnormal findings of blood chemistry: Secondary | ICD-10-CM | POA: Diagnosis not present

## 2016-01-29 DIAGNOSIS — K9189 Other postprocedural complications and disorders of digestive system: Secondary | ICD-10-CM | POA: Diagnosis not present

## 2016-01-29 NOTE — Progress Notes (Signed)
PCP is Dr Eulas Post Denies ever seeing a cardiologist.  Labs noted from 01-23-16 in epic States he had a stress test and echo 6 or more years ago, everything was good.

## 2016-01-31 MED ORDER — CEFAZOLIN SODIUM-DEXTROSE 2-4 GM/100ML-% IV SOLN
2.0000 g | INTRAVENOUS | Status: AC
  Administered 2016-02-01: 2 g via INTRAVENOUS
  Filled 2016-01-31 (×2): qty 100

## 2016-01-31 NOTE — H&P (Signed)
Raymond Kelly. [facility] 01/25/2016 9:11 AM Location: [facility] Patient #: [id number] DOB: 12-14-1970 Married / Language: English / Race: White Male   History of Present Illness (Raymond Kelly A. Ninfa [name] MD; 01/25/2016 9:22 AM) Patient words: New-gallbladder.  The patient is a 45 year old male who presents for evaluation of gall stones. This is a friend of mine who recently had an attack of right upper quadrant abdominal pain with nausea and self-induced vomiting. He had presented to the emergency department and was found to have gallstones on CT scan. There also some findings consistent with mild cholecystitis. His bilirubin was also slightly elevated. Since then he has felt much better and currently has no abdominal pain. This made in the second attack from his gallbladder on questioning. The pain was described as moderate in intensity in the right upper quadrant and epigastrium and a sharp in nature. Bowel movements of the normal. He is otherwise healthy.   Other Problems Malachi Bonds, CMA; 01/25/2016 9:11 AM) Back Pain Kidney Joaquim Lai  Past Surgical History Malachi Bonds, CMA; 01/25/2016 9:11 AM) Tonsillectomy Vasectomy  Diagnostic Studies History Malachi Bonds, CMA; 01/25/2016 9:11 AM) Colonoscopy never  Allergies Malachi Bonds, CMA; 01/25/2016 9:11 AM) No Known Drug Allergies07/21/2017  Medication History Malachi Bonds, CMA; 01/25/2016 9:11 AM) No Current Medications Medications Reconciled  Social History Malachi Bonds, CMA; 01/25/2016 9:11 AM) Caffeine use Coffee. No alcohol use No drug use Tobacco use Never smoker.  Family History Malachi Bonds, CMA; 01/25/2016 9:11 AM) Arthritis Father. Cerebrovascular Accident Father. Colon Polyps Mother. Diabetes Mellitus Brother. Hypertension Mother.    Review of Systems Malachi Bonds CMA; 01/25/2016 9:11 AM) General Not Present- Appetite Loss, Chills, Fatigue, Fever, Night Sweats, Weight Gain and Weight  Loss. Skin Not Present- Change in Wart/Mole, Dryness, Hives, Jaundice, New Lesions, Non-Healing Wounds, Rash and Ulcer. HEENT Not Present- Earache, Hearing Loss, Hoarseness, Nose Bleed, Oral Ulcers, Ringing in the Ears, Seasonal Allergies, Sinus Pain, Sore Throat, Visual Disturbances, Wears glasses/contact lenses and Yellow Eyes. Respiratory Not Present- Bloody sputum, Chronic Cough, Difficulty Breathing, Snoring and Wheezing. Breast Not Present- Breast Mass, Breast Pain, Nipple Discharge and Skin Changes. Cardiovascular Not Present- Chest Pain, Difficulty Breathing Lying Down, Leg Cramps, Palpitations, Rapid Heart Rate, Shortness of Breath and Swelling of Extremities. Gastrointestinal Not Present- Abdominal Pain, Bloating, Bloody Stool, Change in Bowel Habits, Chronic diarrhea, Constipation, Difficulty Swallowing, Excessive gas, Gets full quickly at meals, Hemorrhoids, Indigestion, Nausea, Rectal Pain and Vomiting. Male Genitourinary Not Present- Blood in Urine, Change in Urinary Stream, Frequency, Impotence, Nocturia, Painful Urination, Urgency and Urine Leakage. Musculoskeletal Not Present- Back Pain, Joint Pain, Joint Stiffness, Muscle Pain, Muscle Weakness and Swelling of Extremities. Neurological Not Present- Decreased Memory, Fainting, Headaches, Numbness, Seizures, Tingling, Tremor, Trouble walking and Weakness. Psychiatric Not Present- Anxiety, Bipolar, Change in Sleep Pattern, Depression, Fearful and Frequent crying. Endocrine Not Present- Cold Intolerance, Excessive Hunger, Hair Changes, Heat Intolerance, Hot flashes and New Diabetes. Hematology Not Present- Blood Thinners, Easy Bruising, Excessive bleeding, Gland problems, HIV and Persistent Infections.  Vitals (Chemira Jones CMA; 01/25/2016 9:11 AM) 01/25/2016 9:11 AM Weight: 188.4 lb Height: 68in Body Surface Area: 1.99 m Body Mass Index: 28.65 kg/m        Physical Exam (Genna Casimir A. Ninfa [name] MD; 01/25/2016 9:22  AM) General Mental Status-Alert. General Appearance-Consistent with stated age. Hydration-Well hydrated. Voice-Normal.  Head and Neck Head-normocephalic, atraumatic with no lesions or palpable masses.  Eye Eyeball - Bilateral-Extraocular movements intact. Sclera/Conjunctiva - Bilateral-No scleral icterus.  Chest and Lung Exam Chest and  lung exam reveals -quiet, even and easy respiratory effort with no use of accessory muscles and on auscultation, normal breath sounds, no adventitious sounds and normal vocal resonance. Inspection Chest Wall - Normal. Back - normal.  Cardiovascular Cardiovascular examination reveals -on palpation PMI is normal in location and amplitude, no palpable S3 or S4. Normal cardiac borders., normal heart sounds, regular rate and rhythm with no murmurs, carotid auscultation reveals no bruits and normal pedal pulses bilaterally.  Abdomen Inspection Inspection of the abdomen reveals - No Hernias. Skin - Scar - no surgical scars. Palpation/Percussion Palpation and Percussion of the abdomen reveal - Soft, Non Tender, No Rebound tenderness, No Rigidity (guarding) and No hepatosplenomegaly. Auscultation Auscultation of the abdomen reveals - Bowel sounds normal.  Neurologic - Did not examine.  Musculoskeletal - Did not examine.    Assessment & Plan (Appollonia Klee A. Ninfa [name] MD; 01/25/2016 9:22 AM) SYMPTOMATIC CHOLELITHIASIS (K80.20) Impression: I discussed the diagnosis with him in detail. Laparoscopic cholecystectomy is recommended with cholangiogram. I discussed the risks of surgery which includes but is not limited to bleeding, infection, bile duct injury, bile leak, injury to other structures, the need to convert to an open procedure, postoperative recovery, etc. He understands and wishes to proceed with surgery

## 2016-02-01 ENCOUNTER — Ambulatory Visit (HOSPITAL_COMMUNITY): Payer: 59

## 2016-02-01 ENCOUNTER — Ambulatory Visit (HOSPITAL_COMMUNITY): Payer: 59 | Admitting: Certified Registered Nurse Anesthetist

## 2016-02-01 ENCOUNTER — Encounter (HOSPITAL_COMMUNITY): Payer: Self-pay | Admitting: *Deleted

## 2016-02-01 ENCOUNTER — Ambulatory Visit (HOSPITAL_COMMUNITY)
Admission: RE | Admit: 2016-02-01 | Discharge: 2016-02-01 | Disposition: A | Discharge status: Home / Self Care | Payer: 59 | Source: Ambulatory Visit | Attending: Surgery | Admitting: Surgery

## 2016-02-01 ENCOUNTER — Encounter (HOSPITAL_COMMUNITY)
Admission: RE | Disposition: A | Discharge status: Home / Self Care | Payer: Self-pay | Source: Ambulatory Visit | Attending: Surgery

## 2016-02-01 DIAGNOSIS — K8010 Calculus of gallbladder with chronic cholecystitis without obstruction: Secondary | ICD-10-CM | POA: Insufficient documentation

## 2016-02-01 DIAGNOSIS — K8020 Calculus of gallbladder without cholecystitis without obstruction: Secondary | ICD-10-CM

## 2016-02-01 HISTORY — PX: CHOLECYSTECTOMY: SHX55

## 2016-02-01 IMAGING — RF DG CHOLANGIOGRAM OPERATIVE
1 series · 8 of 8 positions shown · non-contrast
Comparison: CT [DATE]

CLINICAL DATA: 45-year-old male with a history of cholelithiasis

EXAM:
INTRAOPERATIVE CHOLANGIOGRAM
TECHNIQUE: Cholangiographic images from the C-arm fluoroscopic device were
submitted for interpretation post-operatively. Please see the
procedural report for the amount of contrast and the fluoroscopy
time utilized.

[Series 1: run · 2 acquisitions, 8 frames shown]
[im 1/2]
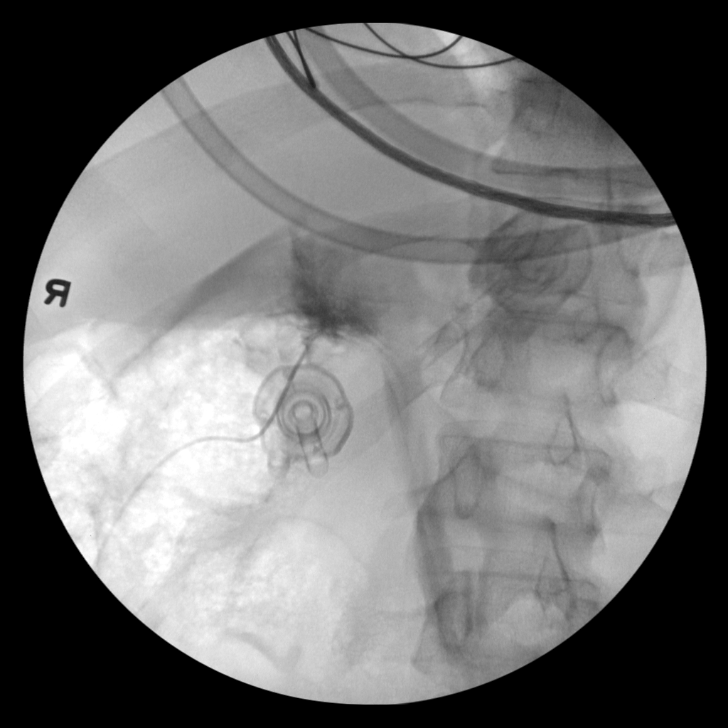
[im 1/2]
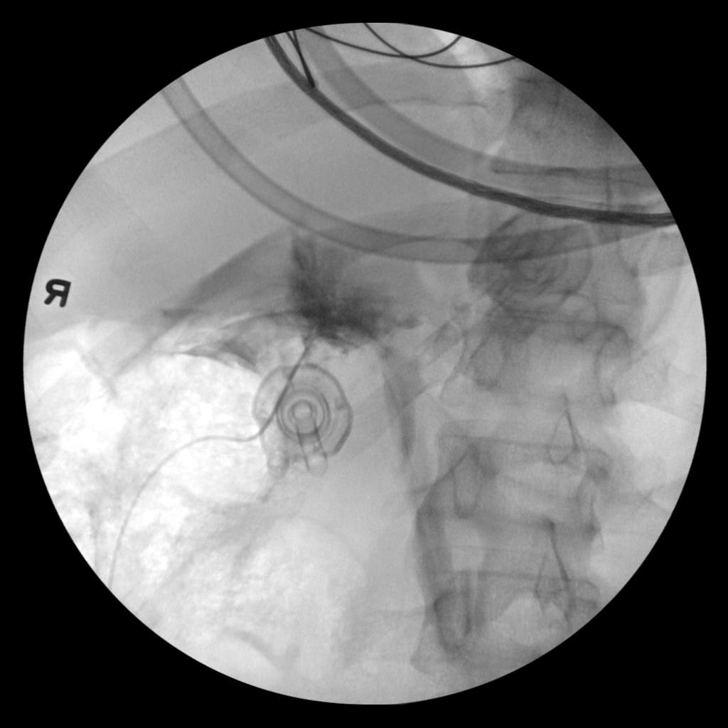
[im 1/2]
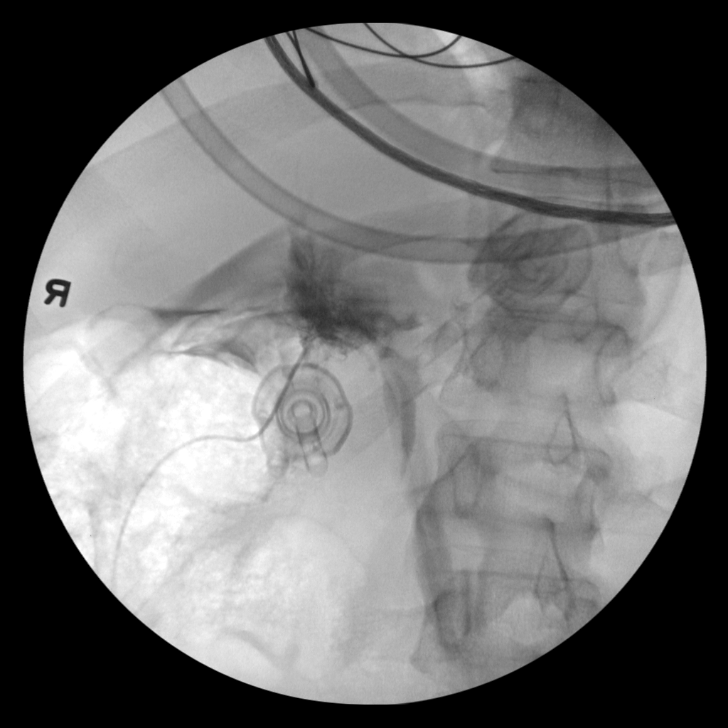
[im 1/2]
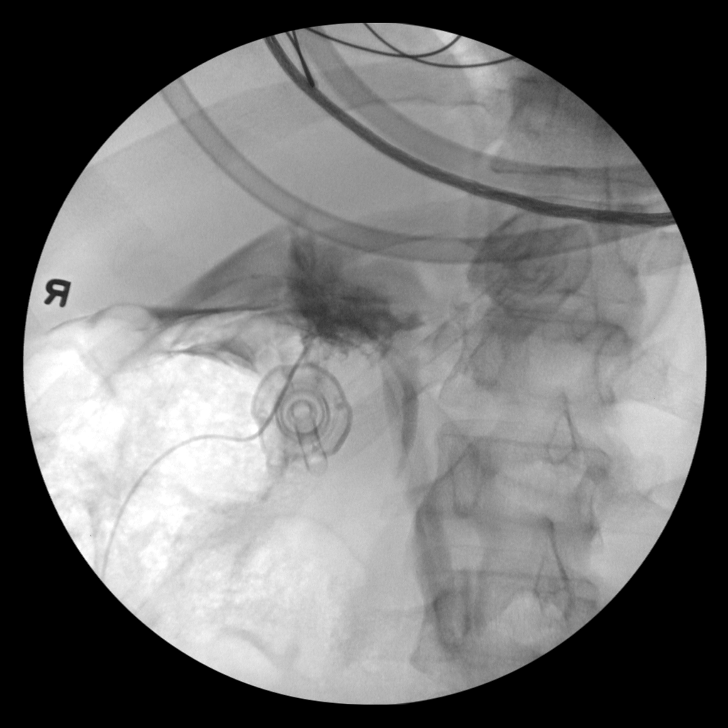
[im 2/2]
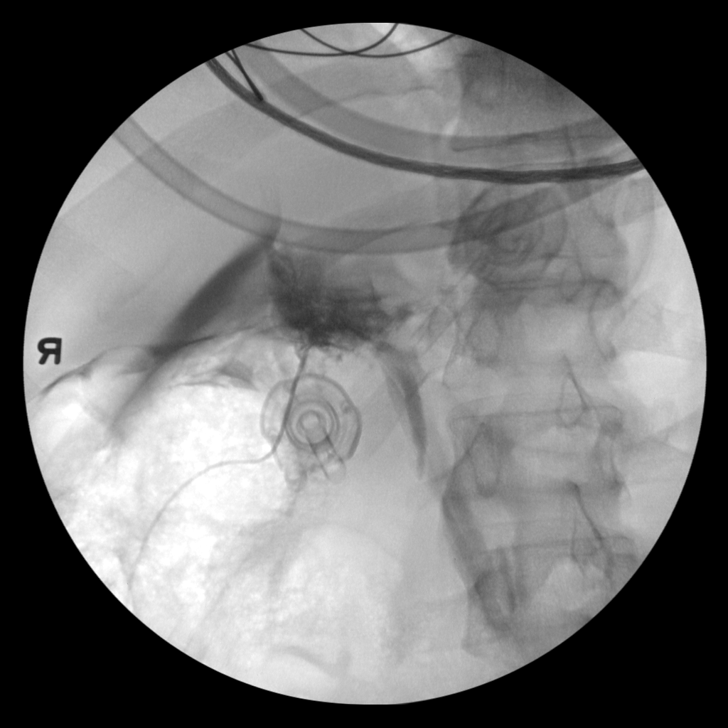
[im 2/2]
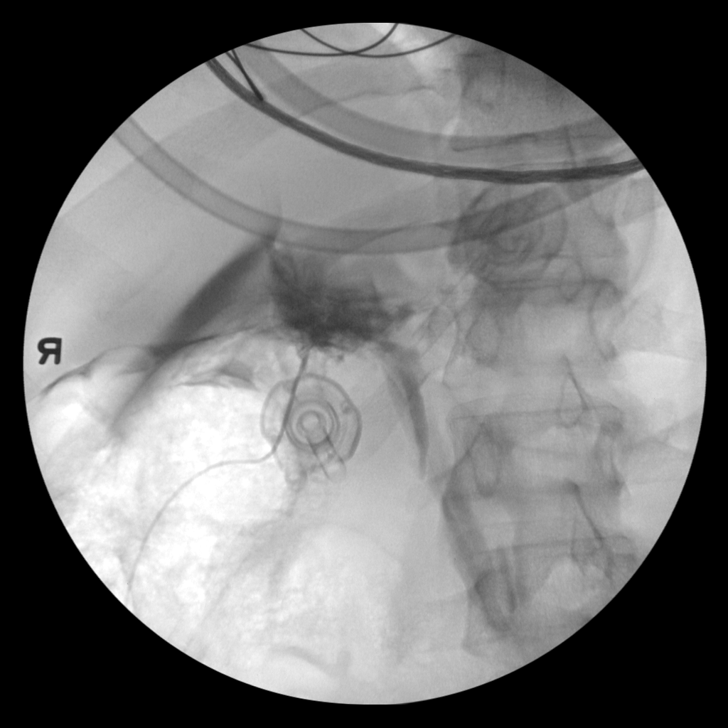
[im 2/2]
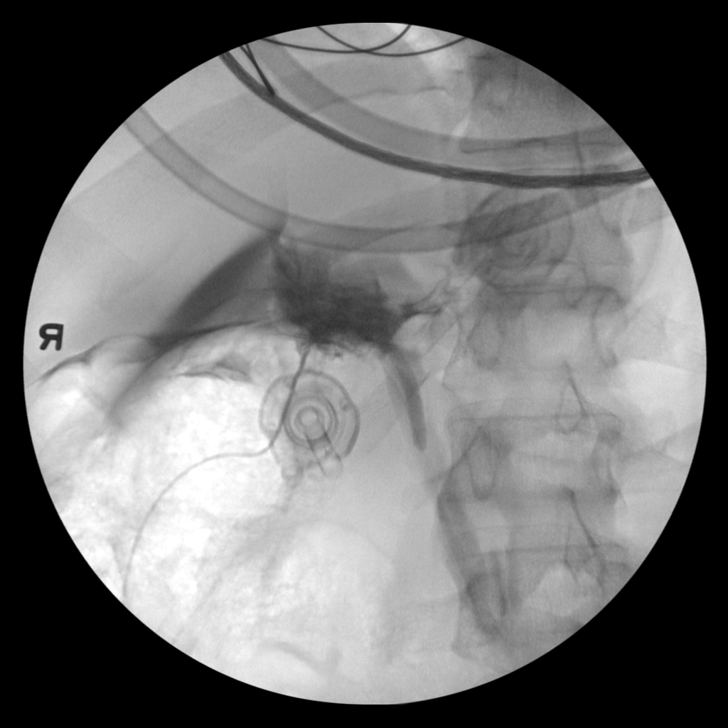
[im 2/2]
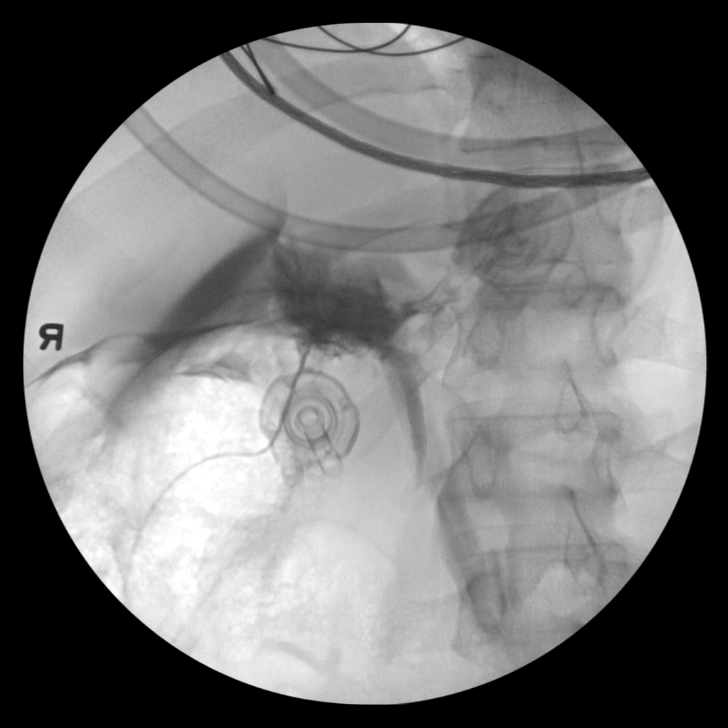

[8 of 8 positions shown; findings below may reference images not displayed]

FINDINGS: Surgical instruments project over the upper abdomen.

There is cannulation of the cystic duct/gallbladder neck, infusion
of contrast. Extraluminal contrast present at the cited cannulation,
along the inferior margin of the liver, and overlying the common
bile duct.

Common bile duct partially filling.

No contrast is visualized to cross the ampulla.
IMPRESSION: Intraoperative cholangiogram demonstrates extraluminal contrast at
the site of cannulation and at the inferior liver margin.

The common bile duct is partially opacified, with no contrast seen
to traverse the ampulla into the duodenum. If there is concern for
choledocholithiasis, correlation with ERCP may be useful.

Please refer to the dictated operative report for full details of
intraoperative findings and procedure

## 2016-02-01 SURGERY — LAPAROSCOPIC CHOLECYSTECTOMY WITH INTRAOPERATIVE CHOLANGIOGRAM
Anesthesia: General

## 2016-02-01 MED ORDER — SUGAMMADEX SODIUM 200 MG/2ML IV SOLN
INTRAVENOUS | Status: DC | PRN
  Administered 2016-02-01: 200 mg via INTRAVENOUS

## 2016-02-01 MED ORDER — 0.9 % SODIUM CHLORIDE (POUR BTL) OPTIME
TOPICAL | Status: DC | PRN
  Administered 2016-02-01: 1000 mL

## 2016-02-01 MED ORDER — MORPHINE SULFATE (PF) 2 MG/ML IV SOLN: 1.0000 mg | INTRAVENOUS | Status: DC | PRN

## 2016-02-01 MED ORDER — KETOROLAC TROMETHAMINE 30 MG/ML IJ SOLN
INTRAMUSCULAR | Status: DC | PRN
  Administered 2016-02-01: 30 mg via INTRAVENOUS

## 2016-02-01 MED ORDER — OXYCODONE HCL 5 MG PO TABS
5.0000 mg | ORAL_TABLET | ORAL | Status: DC | PRN
Start: 2016-02-01 — End: 2016-02-01
  Administered 2016-02-01: 10 mg via ORAL

## 2016-02-01 MED ORDER — CHLORHEXIDINE GLUCONATE CLOTH 2 % EX PADS: 6.0000 | MEDICATED_PAD | Freq: Once | CUTANEOUS | Status: DC

## 2016-02-01 MED ORDER — ONDANSETRON HCL 4 MG/2ML IJ SOLN
INTRAMUSCULAR | Status: DC | PRN
  Administered 2016-02-01: 4 mg via INTRAVENOUS

## 2016-02-01 MED ORDER — ACETAMINOPHEN 325 MG PO TABS
650.0000 mg | ORAL_TABLET | ORAL | Status: DC | PRN
  Administered 2016-02-01: 650 mg via ORAL

## 2016-02-01 MED ORDER — LACTATED RINGERS IV SOLN
INTRAVENOUS | Status: DC
  Administered 2016-02-01 (×3): via INTRAVENOUS

## 2016-02-01 MED ORDER — ACETAMINOPHEN 325 MG PO TABS
ORAL_TABLET | ORAL | Status: AC
  Filled 2016-02-01: qty 2

## 2016-02-01 MED ORDER — SODIUM CHLORIDE 0.9% FLUSH: 3.0000 mL | INTRAVENOUS | Status: DC | PRN

## 2016-02-01 MED ORDER — PROPOFOL 10 MG/ML IV BOLUS
INTRAVENOUS | Status: AC
  Filled 2016-02-01: qty 20

## 2016-02-01 MED ORDER — IOPAMIDOL (ISOVUE-300) INJECTION 61%
INTRAVENOUS | Status: AC
  Filled 2016-02-01: qty 50

## 2016-02-01 MED ORDER — OXYCODONE HCL 5 MG PO TABS
ORAL_TABLET | ORAL | Status: AC
  Administered 2016-02-01: 10 mg via ORAL
  Filled 2016-02-01: qty 2

## 2016-02-01 MED ORDER — HYDROMORPHONE HCL 1 MG/ML IJ SOLN
0.2500 mg | INTRAMUSCULAR | Status: DC | PRN
  Administered 2016-02-01 (×4): 0.5 mg via INTRAVENOUS

## 2016-02-01 MED ORDER — BUPIVACAINE-EPINEPHRINE (PF) 0.25% -1:200000 IJ SOLN
INTRAMUSCULAR | Status: AC
  Filled 2016-02-01: qty 30

## 2016-02-01 MED ORDER — KETOROLAC TROMETHAMINE 30 MG/ML IJ SOLN
INTRAMUSCULAR | Status: AC
  Filled 2016-02-01: qty 1

## 2016-02-01 MED ORDER — SODIUM CHLORIDE 0.9 % IV SOLN: 250.0000 mL | INTRAVENOUS | Status: DC | PRN

## 2016-02-01 MED ORDER — SODIUM CHLORIDE 0.9 % IV SOLN
INTRAVENOUS | Status: DC | PRN
  Administered 2016-02-01: 35 mL

## 2016-02-01 MED ORDER — MIDAZOLAM HCL 5 MG/5ML IJ SOLN
INTRAMUSCULAR | Status: DC | PRN
  Administered 2016-02-01: 2 mg via INTRAVENOUS

## 2016-02-01 MED ORDER — SUGAMMADEX SODIUM 200 MG/2ML IV SOLN
INTRAVENOUS | Status: AC
  Filled 2016-02-01: qty 2

## 2016-02-01 MED ORDER — FENTANYL CITRATE (PF) 100 MCG/2ML IJ SOLN
INTRAMUSCULAR | Status: DC | PRN
  Administered 2016-02-01 (×5): 50 ug via INTRAVENOUS

## 2016-02-01 MED ORDER — SODIUM CHLORIDE 0.9 % IR SOLN
Status: DC | PRN
  Administered 2016-02-01: 1000 mL

## 2016-02-01 MED ORDER — LIDOCAINE HCL (CARDIAC) 20 MG/ML IV SOLN
INTRAVENOUS | Status: DC | PRN
  Administered 2016-02-01: 100 mg via INTRAVENOUS

## 2016-02-01 MED ORDER — BUPIVACAINE-EPINEPHRINE 0.25% -1:200000 IJ SOLN
INTRAMUSCULAR | Status: DC | PRN
  Administered 2016-02-01: 20 mL

## 2016-02-01 MED ORDER — PROPOFOL 10 MG/ML IV BOLUS
INTRAVENOUS | Status: DC | PRN
  Administered 2016-02-01: 60 mg via INTRAVENOUS
  Administered 2016-02-01: 200 mg via INTRAVENOUS
  Administered 2016-02-01: 40 mg via INTRAVENOUS
  Administered 2016-02-01: 20 mg via INTRAVENOUS

## 2016-02-01 MED ORDER — HYDROMORPHONE HCL 1 MG/ML IJ SOLN
INTRAMUSCULAR | Status: AC
  Administered 2016-02-01: 0.5 mg via INTRAVENOUS
  Filled 2016-02-01: qty 1

## 2016-02-01 MED ORDER — OXYCODONE-ACETAMINOPHEN 5-325 MG PO TABS: 1.0000 | ORAL_TABLET | ORAL | 0 refills | Status: DC | PRN

## 2016-02-01 MED ORDER — ROCURONIUM BROMIDE 100 MG/10ML IV SOLN
INTRAVENOUS | Status: DC | PRN
  Administered 2016-02-01: 50 mg via INTRAVENOUS

## 2016-02-01 MED ORDER — FENTANYL CITRATE (PF) 250 MCG/5ML IJ SOLN
INTRAMUSCULAR | Status: AC
  Filled 2016-02-01: qty 5

## 2016-02-01 MED ORDER — MIDAZOLAM HCL 2 MG/2ML IJ SOLN
INTRAMUSCULAR | Status: AC
  Filled 2016-02-01: qty 2

## 2016-02-01 MED ORDER — ACETAMINOPHEN 650 MG RE SUPP: 650.0000 mg | RECTAL | Status: DC | PRN

## 2016-02-01 MED ORDER — SODIUM CHLORIDE 0.9% FLUSH: 3.0000 mL | Freq: Two times a day (BID) | INTRAVENOUS | Status: DC

## 2016-02-01 SURGICAL SUPPLY — 40 items
APPLIER CLIP 5 13 M/L LIGAMAX5 (MISCELLANEOUS) ×3
BLADE SURG CLIPPER 3M 9600 (MISCELLANEOUS) ×3 IMPLANT
CANISTER SUCTION 2500CC (MISCELLANEOUS) ×3 IMPLANT
CHLORAPREP W/TINT 26ML (MISCELLANEOUS) ×3 IMPLANT
CLIP APPLIE 5 13 M/L LIGAMAX5 (MISCELLANEOUS) ×1 IMPLANT
CONT SPEC 4OZ CLIKSEAL STRL BL (MISCELLANEOUS) ×3 IMPLANT
COVER MAYO STAND STRL (DRAPES) ×3 IMPLANT
COVER SURGICAL LIGHT HANDLE (MISCELLANEOUS) ×3 IMPLANT
DRAPE C-ARM 42X72 X-RAY (DRAPES) ×3 IMPLANT
ELECT REM PT RETURN 9FT ADLT (ELECTROSURGICAL) ×3
ELECTRODE REM PT RTRN 9FT ADLT (ELECTROSURGICAL) ×1 IMPLANT
GLOVE BIO SURGEON STRL SZ 6.5 (GLOVE) ×2 IMPLANT
GLOVE BIO SURGEON STRL SZ8 (GLOVE) ×3 IMPLANT
GLOVE BIO SURGEONS STRL SZ 6.5 (GLOVE) ×1
GLOVE BIOGEL PI IND STRL 7.0 (GLOVE) ×1 IMPLANT
GLOVE BIOGEL PI INDICATOR 7.0 (GLOVE) ×2
GLOVE SURG SIGNA 7.5 PF LTX (GLOVE) ×3 IMPLANT
GOWN STRL REUS W/ TWL LRG LVL3 (GOWN DISPOSABLE) ×1 IMPLANT
GOWN STRL REUS W/ TWL XL LVL3 (GOWN DISPOSABLE) ×2 IMPLANT
GOWN STRL REUS W/TWL LRG LVL3 (GOWN DISPOSABLE) ×2
GOWN STRL REUS W/TWL XL LVL3 (GOWN DISPOSABLE) ×4
KIT BASIN OR (CUSTOM PROCEDURE TRAY) ×3 IMPLANT
KIT ROOM TURNOVER OR (KITS) ×3 IMPLANT
LIQUID BAND (GAUZE/BANDAGES/DRESSINGS) ×3 IMPLANT
NS IRRIG 1000ML POUR BTL (IV SOLUTION) ×3 IMPLANT
PAD ARMBOARD 7.5X6 YLW CONV (MISCELLANEOUS) ×3 IMPLANT
POUCH SPECIMEN RETRIEVAL 10MM (ENDOMECHANICALS) ×3 IMPLANT
SCISSORS LAP 5X35 DISP (ENDOMECHANICALS) ×3 IMPLANT
SET CHOLANGIOGRAPH 5 50 .035 (SET/KITS/TRAYS/PACK) ×3 IMPLANT
SET IRRIG TUBING LAPAROSCOPIC (IRRIGATION / IRRIGATOR) ×3 IMPLANT
SLEEVE ENDOPATH XCEL 5M (ENDOMECHANICALS) ×6 IMPLANT
SOLUTION ANTI FOG 6CC (MISCELLANEOUS) ×3 IMPLANT
SUT MON AB 4-0 PC3 18 (SUTURE) ×3 IMPLANT
SUT VICRYL 0 UR6 27IN ABS (SUTURE) ×3 IMPLANT
TOWEL OR 17X24 6PK STRL BLUE (TOWEL DISPOSABLE) ×3 IMPLANT
TOWEL OR 17X26 10 PK STRL BLUE (TOWEL DISPOSABLE) ×3 IMPLANT
TRAY LAPAROSCOPIC MC (CUSTOM PROCEDURE TRAY) ×3 IMPLANT
TROCAR XCEL BLUNT TIP 100MML (ENDOMECHANICALS) ×3 IMPLANT
TROCAR XCEL NON-BLD 5MMX100MML (ENDOMECHANICALS) ×3 IMPLANT
TUBING INSUFFLATION (TUBING) ×3 IMPLANT

## 2016-02-01 NOTE — Discharge Instructions (Signed)
[facility] ______[facility] °LAPAROSCOPIC SURGERY: POST OP INSTRUCTIONS °Always review your discharge instruction sheet given to you by the facility where your surgery was performed. °IF YOU HAVE DISABILITY OR FAMILY LEAVE FORMS, YOU MUST BRING THEM TO THE OFFICE FOR PROCESSING.   °DO NOT GIVE THEM TO YOUR DOCTOR. ° °1. A prescription for pain medication may be given to you upon discharge.  Take your pain medication as prescribed, if needed.  If narcotic pain medicine is not needed, then you may take acetaminophen (Tylenol) or ibuprofen (Advil) as needed. °2. Take your usually prescribed medications unless otherwise directed. °3. If you need a refill on your pain medication, please contact your pharmacy.  They will contact our office to request authorization. Prescriptions will not be filled after 5pm or on week-ends. °4. You should follow a light diet the first few days after arrival home, such as soup and crackers, etc.  Be sure to include lots of fluids daily. °5. Most patients will experience some swelling and bruising in the area of the incisions.  Ice packs will help.  Swelling and bruising can take several days to resolve.  °6. It is common to experience some constipation if taking pain medication after surgery.  Increasing fluid intake and taking a stool softener (such as Colace) will usually help or prevent this problem from occurring.  A mild laxative (Milk of Magnesia or Miralax) should be taken according to package instructions if there are no bowel movements after 48 hours. °7. Unless discharge instructions indicate otherwise, you may remove your bandages 24-48 hours after surgery, and you may shower at that time.  You may have steri-strips (small skin tapes) in place directly over the incision.  These strips should be left on the skin for 7-10 days.  If your surgeon used skin glue on the incision, you may shower in 24 hours.  The glue will flake off over the next 2-3 weeks.  Any sutures or  staples will be removed at the office during your follow-up visit. °8. ACTIVITIES:  You may resume regular (light) daily activities beginning the next day--such as daily self-care, walking, climbing stairs--gradually increasing activities as tolerated.  You may have sexual intercourse when it is comfortable.  Refrain from any heavy lifting or straining until approved by your doctor. °a. You may drive when you are no longer taking prescription pain medication, you can comfortably wear a seatbelt, and you can safely maneuver your car and apply brakes. °b. RETURN TO WORK:  __________________________________________________________ °9. You should see your doctor in the office for a follow-up appointment approximately 2-3 weeks after your surgery.  Make sure that you call for this appointment within a day or two after you arrive home to insure a convenient appointment time. °10. OTHER INSTRUCTIONS: __________________________________________________________________________________________________________________________ __________________________________________________________________________________________________________________________ °WHEN TO CALL YOUR DOCTOR: °1. Fever over 101.0 °2. Inability to urinate °3. Continued bleeding from incision. °4. Increased pain, redness, or drainage from the incision. °5. Increasing abdominal pain ° °The clinic staff is available to answer your questions during regular business hours.  Please don’t hesitate to call and ask to speak to one of the nurses for clinical concerns.  If you have a medical emergency, go to the nearest emergency room or call 911.  A surgeon from [facility] is always on call at the hospital. °[address], [address], [place], [place]  [address] ? [address] [address], [place], [place]   [address] °[phone] ? [phone] ? FAX [phone] °Web site:   [email or web address] °

## 2016-02-01 NOTE — Transfer of Care (Signed)
Immediate Anesthesia Transfer of Care Note  Patient: Raymond Kelly  Procedure(s) Performed: Procedure(s): LAPAROSCOPIC CHOLECYSTECTOMY WITH INTRAOPERATIVE CHOLANGIOGRAM (N/A)  Patient Location: PACU  Anesthesia Type:General  Level of Consciousness: awake, alert  and oriented  Airway & Oxygen Therapy: Patient Spontanous Breathing and Patient connected to nasal cannula oxygen  Post-op Assessment: Report given to RN and Post -op Vital signs reviewed and stable  Post vital signs: Reviewed and stable  Last Vitals:  Vitals:   02/01/16 1355  BP: (!) 149/92  Pulse: 62  Resp: 13  Temp: 36.4 C    Last Pain:  Vitals:   02/01/16 1355  PainSc: 0-No pain         Complications: No apparent anesthesia complications

## 2016-02-01 NOTE — Anesthesia Preprocedure Evaluation (Signed)
Anesthesia Evaluation  Patient identified by MRN, date of birth, ID band Patient awake    Reviewed: Allergy & Precautions, NPO status , Patient's Chart, lab work & pertinent test results  Airway Mallampati: II  TM Distance: >3 FB Neck ROM: Full    Dental  (+) Teeth Intact   Pulmonary    breath sounds clear to auscultation       Cardiovascular negative cardio ROS   Rhythm:Regular     Neuro/Psych    GI/Hepatic   Endo/Other    Renal/GU Renal disease     Musculoskeletal   Abdominal (+)  Abdomen: soft.    Peds  Hematology   Anesthesia Other Findings   Reproductive/Obstetrics                             Anesthesia Physical Anesthesia Plan  ASA: II  Anesthesia Plan: General   Post-op Pain Management:    Induction: Intravenous  Airway Management Planned: Oral ETT  Additional Equipment:   Intra-op Plan:   Post-operative Plan: Extubation in OR  Informed Consent:   Dental advisory given  Plan Discussed with: Anesthesiologist and Surgeon  Anesthesia Plan Comments:         Anesthesia Quick Evaluation

## 2016-02-01 NOTE — Op Note (Signed)
LAPAROSCOPIC CHOLECYSTECTOMY WITH INTRAOPERATIVE CHOLANGIOGRAM  Procedure Note  TEGH BRYS 02/01/2016   Pre-op Diagnosis: symtomatic cholelithiasis     Post-op Diagnosis: same  Procedure(s): LAPAROSCOPIC CHOLECYSTECTOMY WITH INTRAOPERATIVE CHOLANGIOGRAM  Surgeon(s): Coralie Keens, MD  Anesthesia: General  Staff:  Circulator: Tereasa Coop, RN Radiology Technologist: Betsey Holiday Scrub Person: Dory Peru, RN; Adella Hare Circulator Assistant: Dory Peru, RN  Estimated Blood Loss: Minimal               Specimens: sent to path          [facility]   Date: 02/01/2016  Time: 1:30 PM

## 2016-02-01 NOTE — Anesthesia Procedure Notes (Signed)
Procedure Name: Intubation Date/Time: 02/01/2016 12:38 PM Performed by: Clearnce Sorrel Pre-anesthesia Checklist: Patient identified, Emergency Drugs available, Suction available, Patient being monitored and Timeout performed Patient Re-evaluated:Patient Re-evaluated prior to inductionOxygen Delivery Method: Circle system utilized Preoxygenation: Pre-oxygenation with 100% oxygen Intubation Type: IV induction Ventilation: Mask ventilation without difficulty Laryngoscope Size: Mac, 3, Glidescope and 4 Grade View: Grade III Tube type: Oral Tube size: 7.5 mm Number of attempts: 2 Airway Equipment and Method: Stylet Placement Confirmation: ETT inserted through vocal cords under direct vision,  positive ETCO2 and breath sounds checked- equal and bilateral Secured at: 23 cm Tube secured with: Tape Dental Injury: Teeth and Oropharynx as per pre-operative assessment

## 2016-02-01 NOTE — Op Note (Signed)
NAME:  Raymond Kelly, Raymond Kelly NO.:  [id number]  MEDICAL RECORD NO.:  [id number]  LOCATION:  [facility]                         FACILITY:  [facility]  PHYSICIAN:  Coralie Keens, M.D. DATE OF BIRTH:  06-Apr-1971  DATE OF PROCEDURE:  02/01/2016 DATE OF DISCHARGE:  02/01/2016                              OPERATIVE REPORT   PREOPERATIVE DIAGNOSIS:  Symptomatic cholelithiasis.  POSTOPERATIVE DIAGNOSIS:  Chronic cholecystitis with cholelithiasis.  PROCEDURE:  Laparoscopic cholecystectomy with intraoperative cholangiogram.  SURGEON:  Coralie Keens, M.D.  ANESTHESIA:  General and 0.25% Marcaine.  ESTIMATED BLOOD LOSS:  Minimal.  INDICATIONS:  A 45 year old gentleman, who presented with right upper quadrant abdominal pain and pain in the epigastrium.  He was found on CT scan to have cholelithiasis and findings consistent with possible mild cholecystitis.  Decision was made to proceed to the operating room for cholecystectomy.  FINDINGS:  The patient was found to have a chronically thick-walled appearing gallbladder with multiple gallstones.  During cholangiogram, I could see the bile duct taper but could not get contrast to go into the duodenum, but there did not appear to be a filling defect in the bile duct.  PROCEDURE IN DETAIL:  The patient was brought to the operating room, identified as Raymond Kelly.  He was placed in supine on the operating table.  General anesthesia was induced.  His abdomen was then prepped and draped in usual sterile fashion.  I made a small transverse incision at the lower edge of the umbilicus.  I carried this down to the fascia which was opened just to the edge of a very small umbilical fascial defect.  I then opened the fascia further with a scalpel and used a hemostat to gain entrance to the peritoneal cavity under direct vision. I then placed a 0 Vicryl pursestring suture around the fascial opening. The stump was placed through the opening and  insufflation of the abdomen was begun.  A 5-mm port was then placed in the patient's epigastrium and 2 more in the right upper quadrant, all under direct vision.  The gallbladder was then grasped and retracted above liver bed.  It was found to be very thick-walled and chronically inflamed in appearance.  I dissected out the cystic duct and a critical window was achieved around it.  I then clipped it once distally up with laparoscopic scissors.  I then placed a cholangiocatheter through an opening in the right upper quadrant under direct vision and placed this into the cystic opening.  I then performed a cholangiogram with fluoroscopy.  On first attempt, the contrast leak, so I had to reposition the catheter and then I was able to contract to go down into the distal bile duct.  It tapered well without evidence of obstructing stone, but I could not get it to go into the duodenum.  As suspected, it was spasming.  At this point, I removed the catheter.  I clipped the cystic duct 3 times proximally, completely transected.  I then identified the cystic artery and clipped proximally, distally, and transected as well.  The gallbladder was then slowly dissected free from liver bed with electrocautery.  Once this was freed from  liver bed, hemostasis was achieved with the cautery.  I then placed the gallbladder in an endosac, removed through the incision at the umbilicus.  I then tied the 0 Vicryl in the umbilicus and place closing the fascial defect.  I placed another figure-of-eight suture at the umbilicus to ensure the closure of the fascial defect and small umbilical hernia.  I then copiously irrigated the abdomen with normal saline.  Again, hemostasis appeared to be achieved.  All ports were removed under direct vision and the abdomen was deflated.  All incisions were then anesthetized with Marcaine and closed with 4-0 Monocryl subcuticular sutures.  Skin glue was then applied.  The  patient tolerated the procedure well.  All the counts were correct at the end of procedure.  The patient was then extubated in the operating room and taken in stable condition to recovery room.     Coralie Keens, M.D.     DB/MEDQ  D:  02/01/2016  T:  02/01/2016  Job:  [id number]

## 2016-02-01 NOTE — Interval H&P Note (Signed)
History and Physical Interval Note:no change in H and P  02/01/2016 11:52 AM  Raymond Kelly  has presented today for surgery, with the diagnosis of symtomatic cholelithiasis  The various methods of treatment have been discussed with the patient and family. After consideration of risks, benefits and other options for treatment, the patient has consented to  Procedure(s): LAPAROSCOPIC CHOLECYSTECTOMY WITH INTRAOPERATIVE CHOLANGIOGRAM (N/A) as a surgical intervention .  The patient's history has been reviewed, patient examined, no change in status, stable for surgery.  I have reviewed the patient's chart and labs.  Questions were answered to the patient's satisfaction.     Artem Bunte A

## 2016-02-01 NOTE — Anesthesia Preprocedure Evaluation (Addendum)
Anesthesia Evaluation  Patient identified by MRN, date of birth, ID band Patient awake    Reviewed: Allergy & Precautions, NPO status , Patient's Chart, lab work & pertinent test results  Airway Mallampati: II  TM Distance: >3 FB     Dental   Pulmonary    breath sounds clear to auscultation       Cardiovascular negative cardio ROS   Rhythm:Regular Rate:Normal     Neuro/Psych  Headaches,    GI/Hepatic negative GI ROS, Neg liver ROS,   Endo/Other  negative endocrine ROS  Renal/GU Renal disease  negative genitourinary   Musculoskeletal   Abdominal   Peds  Hematology   Anesthesia Other Findings   Reproductive/Obstetrics                             Anesthesia Physical Anesthesia Plan  ASA: II  Anesthesia Plan: General   Post-op Pain Management:    Induction: Intravenous  Airway Management Planned: Oral ETT  Additional Equipment:   Intra-op Plan:   Post-operative Plan: Extubation in OR  Informed Consent: I have reviewed the patients History and Physical, chart, labs and discussed the procedure including the risks, benefits and alternatives for the proposed anesthesia with the patient or authorized representative who has indicated his/her understanding and acceptance.   Dental advisory given  Plan Discussed with: CRNA and Anesthesiologist  Anesthesia Plan Comments:         Anesthesia Quick Evaluation

## 2016-02-04 ENCOUNTER — Emergency Department (HOSPITAL_COMMUNITY): Payer: 59

## 2016-02-04 ENCOUNTER — Encounter (HOSPITAL_COMMUNITY): Admission: EM | Disposition: A | Discharge status: Home / Self Care | Payer: Self-pay | Source: Home / Self Care

## 2016-02-04 ENCOUNTER — Inpatient Hospital Stay (HOSPITAL_COMMUNITY): Payer: 59

## 2016-02-04 ENCOUNTER — Observation Stay (HOSPITAL_COMMUNITY): Payer: 59 | Admitting: Anesthesiology

## 2016-02-04 ENCOUNTER — Encounter (HOSPITAL_COMMUNITY): Payer: Self-pay | Admitting: Emergency Medicine

## 2016-02-04 ENCOUNTER — Observation Stay (HOSPITAL_COMMUNITY): Payer: 59

## 2016-02-04 ENCOUNTER — Inpatient Hospital Stay (HOSPITAL_COMMUNITY)
Admission: EM | Admit: 2016-02-04 | Discharge: 2016-02-06 | DRG: 395 | Disposition: A | Discharge status: Home / Self Care | Payer: 59 | Attending: General Surgery | Admitting: General Surgery

## 2016-02-04 DIAGNOSIS — Z79899 Other long term (current) drug therapy: Secondary | ICD-10-CM

## 2016-02-04 DIAGNOSIS — K839 Disease of biliary tract, unspecified: Secondary | ICD-10-CM

## 2016-02-04 DIAGNOSIS — Z9049 Acquired absence of other specified parts of digestive tract: Secondary | ICD-10-CM

## 2016-02-04 DIAGNOSIS — K9189 Other postprocedural complications and disorders of digestive system: Principal | ICD-10-CM | POA: Diagnosis present

## 2016-02-04 DIAGNOSIS — Z419 Encounter for procedure for purposes other than remedying health state, unspecified: Secondary | ICD-10-CM

## 2016-02-04 DIAGNOSIS — K5900 Constipation, unspecified: Secondary | ICD-10-CM | POA: Diagnosis present

## 2016-02-04 DIAGNOSIS — R109 Unspecified abdominal pain: Secondary | ICD-10-CM

## 2016-02-04 DIAGNOSIS — R7989 Other specified abnormal findings of blood chemistry: Secondary | ICD-10-CM

## 2016-02-04 DIAGNOSIS — K838 Other specified diseases of biliary tract: Secondary | ICD-10-CM | POA: Diagnosis not present

## 2016-02-04 DIAGNOSIS — Z87442 Personal history of urinary calculi: Secondary | ICD-10-CM

## 2016-02-04 DIAGNOSIS — R945 Abnormal results of liver function studies: Secondary | ICD-10-CM

## 2016-02-04 DIAGNOSIS — K929 Disease of digestive system, unspecified: Secondary | ICD-10-CM | POA: Diagnosis not present

## 2016-02-04 DIAGNOSIS — G8918 Other acute postprocedural pain: Secondary | ICD-10-CM

## 2016-02-04 DIAGNOSIS — Y836 Removal of other organ (partial) (total) as the cause of abnormal reaction of the patient, or of later complication, without mention of misadventure at the time of the procedure: Secondary | ICD-10-CM | POA: Diagnosis present

## 2016-02-04 LAB — CBC WITH DIFFERENTIAL/PLATELET
BASOS PCT: 0 %
Basophils Absolute: 0 10*3/uL (ref 0.0–0.1)
EOS ABS: 0.1 10*3/uL (ref 0.0–0.7)
Eosinophils Relative: 1 %
HEMATOCRIT: 44.8 % (ref 39.0–52.0)
HEMOGLOBIN: 15.5 g/dL (ref 13.0–17.0)
LYMPHS ABS: 1 10*3/uL (ref 0.7–4.0)
Lymphocytes Relative: 9 %
MCH: 31.8 pg (ref 26.0–34.0)
MCHC: 34.6 g/dL (ref 30.0–36.0)
MCV: 92 fL (ref 78.0–100.0)
MONOS PCT: 12 %
Monocytes Absolute: 1.4 10*3/uL — ABNORMAL HIGH (ref 0.1–1.0)
NEUTROS ABS: 9.1 10*3/uL — AB (ref 1.7–7.7)
NEUTROS PCT: 78 %
Platelets: 246 10*3/uL (ref 150–400)
RBC: 4.87 MIL/uL (ref 4.22–5.81)
RDW: 12.5 % (ref 11.5–15.5)
WBC: 11.6 10*3/uL — AB (ref 4.0–10.5)

## 2016-02-04 LAB — COMPREHENSIVE METABOLIC PANEL
ALBUMIN: 4.4 g/dL (ref 3.5–5.0)
ALK PHOS: 102 U/L (ref 38–126)
ALT: 191 U/L — AB (ref 17–63)
ANION GAP: 9 (ref 5–15)
AST: 157 U/L — AB (ref 15–41)
BILIRUBIN TOTAL: 3.9 mg/dL — AB (ref 0.3–1.2)
BUN: 13 mg/dL (ref 6–20)
CALCIUM: 8.9 mg/dL (ref 8.9–10.3)
CO2: 27 mmol/L (ref 22–32)
CREATININE: 0.83 mg/dL (ref 0.61–1.24)
Chloride: 101 mmol/L (ref 101–111)
GFR calc Af Amer: >60 mL/min (ref >60)
GFR calc non Af Amer: >60 mL/min (ref >60)
GLUCOSE: 122 mg/dL — AB (ref 65–99)
Potassium: 4.4 mmol/L (ref 3.5–5.1)
SODIUM: 137 mmol/L (ref 135–145)
TOTAL PROTEIN: 7.8 g/dL (ref 6.5–8.1)

## 2016-02-04 LAB — LIPASE, BLOOD: Lipase: 21 U/L (ref 11–51)

## 2016-02-04 IMAGING — CT CT ABD-PELV W/ CM
2 of 5 series · 15 of 46 positions shown, 17 images · IV contrast (ISOVUE)
Comparison: CT dated [DATE]

CLINICAL DATA: 45-year-old male status post cholecystectomy on
[DATE] presenting with abdominal pain at the surgical site.

EXAM:
CT ABDOMEN AND PELVIS WITH CONTRAST
TECHNIQUE: Multidetector CT imaging of the abdomen and pelvis was performed
using the standard protocol following bolus administration of
intravenous contrast.
CONTRAST:  100mL [XP] IOPAMIDOL ([XP]) INJECTION 61%

[Series 2: abd/pel with · axial · 0.79mm/px · z∈[+1093,+1498]mm · 12 of 93 slices shown, 14 images]
[im 6/93  soft-tissue]
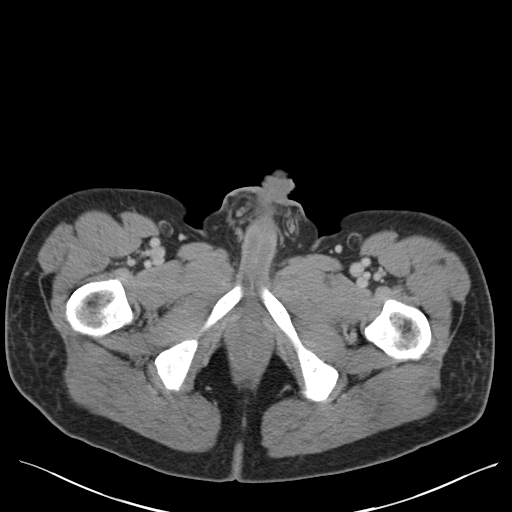
[im 6/93  bone]
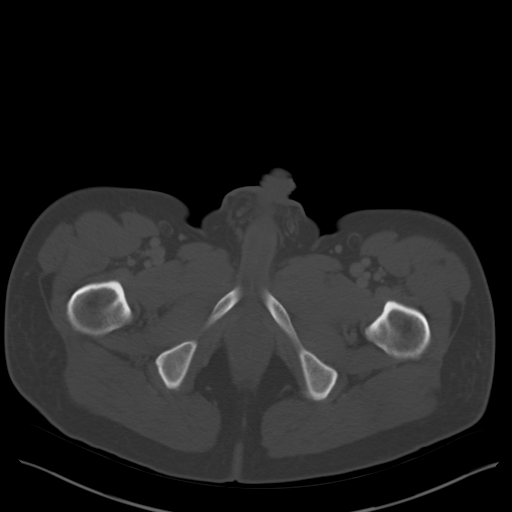
[im 17/93  soft-tissue]
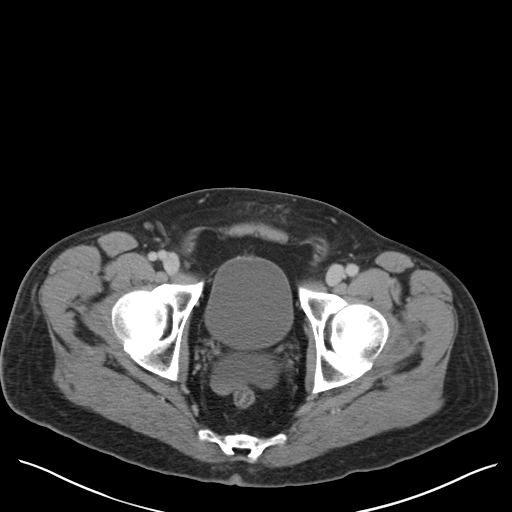
[im 22/93  soft-tissue]
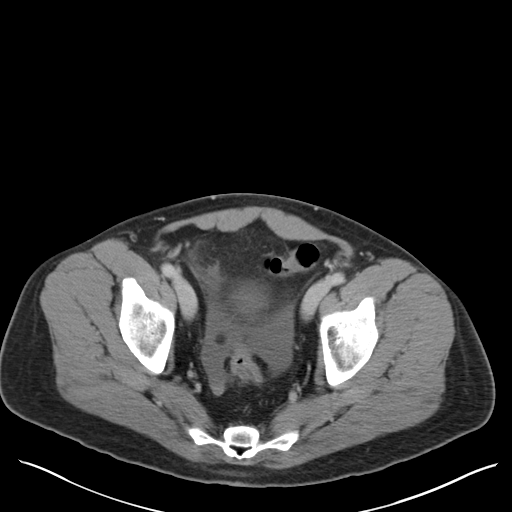
[im 28/93  soft-tissue]
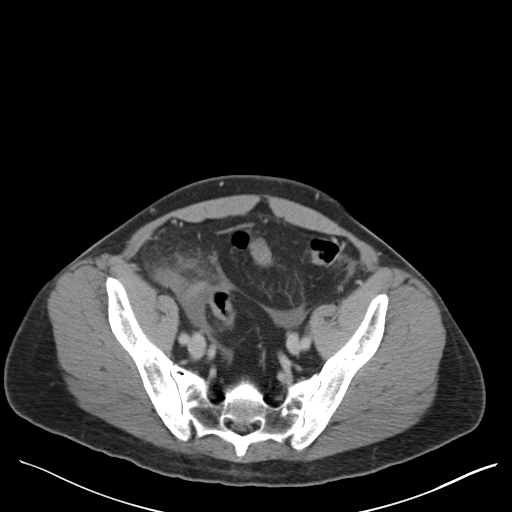
[im 38/93  soft-tissue]
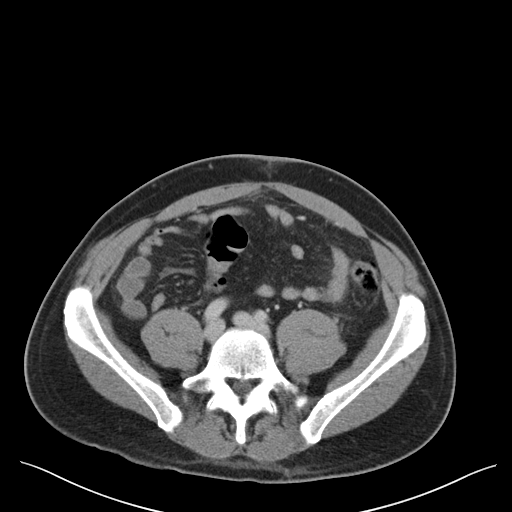
[im 44/93  soft-tissue]
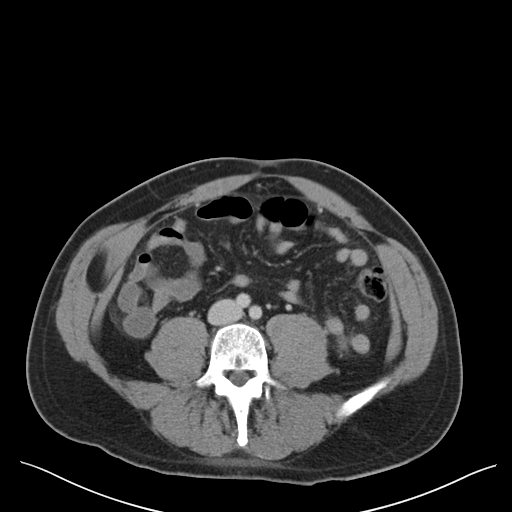
[im 49/93  soft-tissue]
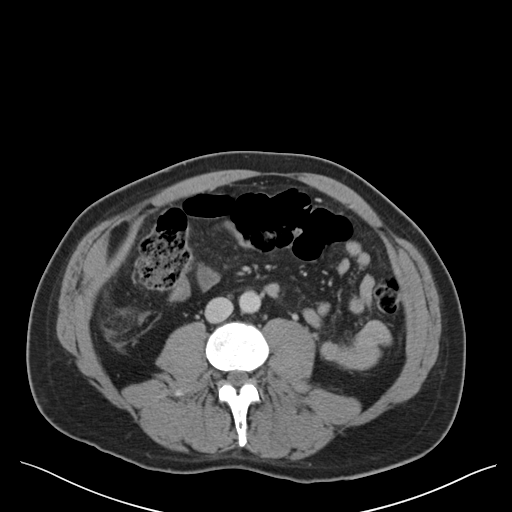
[im 60/93  soft-tissue]
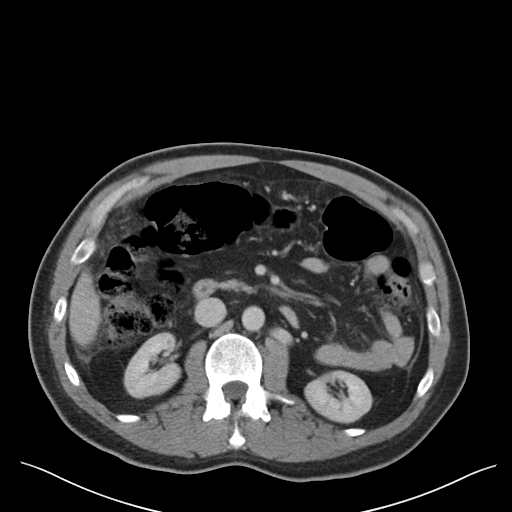
[im 65/93  soft-tissue]
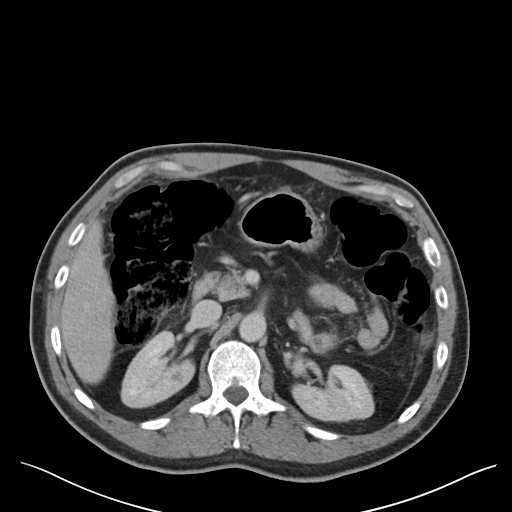
[im 65/93  bone]
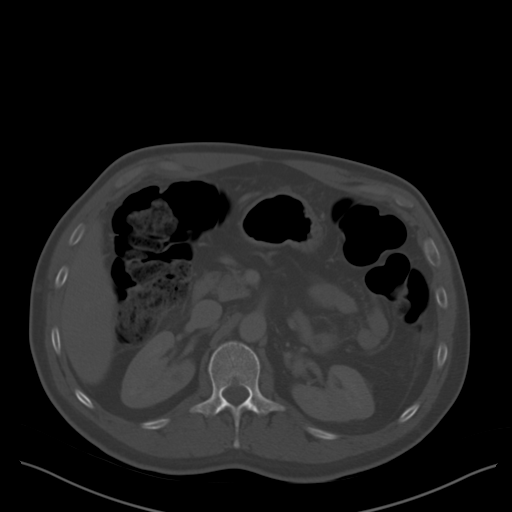
[im 71/93  soft-tissue]
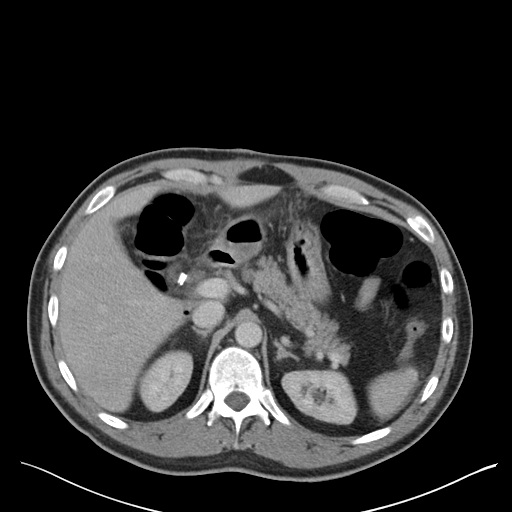
[im 82/93  soft-tissue]
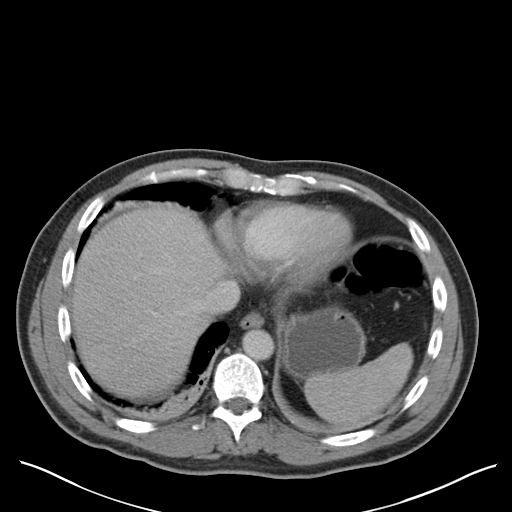
[im 87/93  soft-tissue]
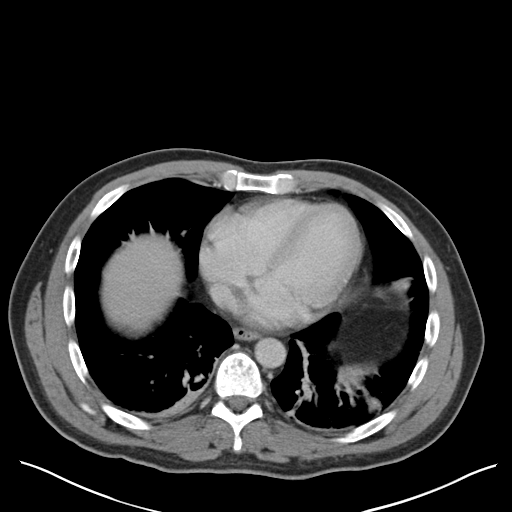

[Series 4: coronal a/|p · coronal · 0.66mm/px · 3 of 127 slices shown]
[im 43/127  soft-tissue]
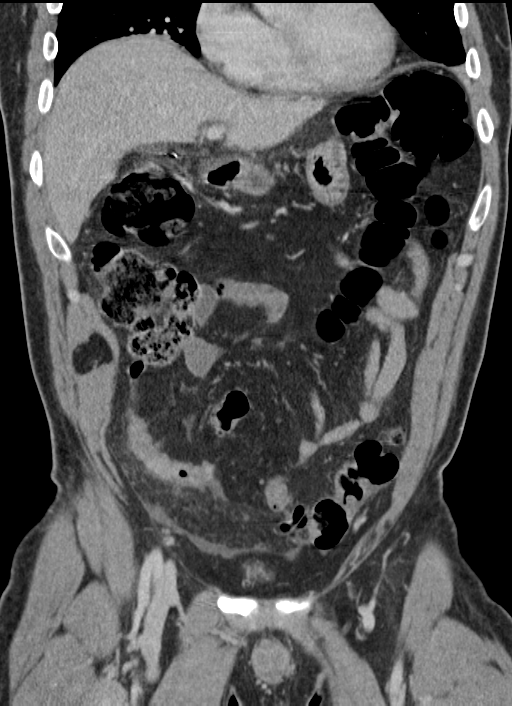
[im 57/127  soft-tissue]
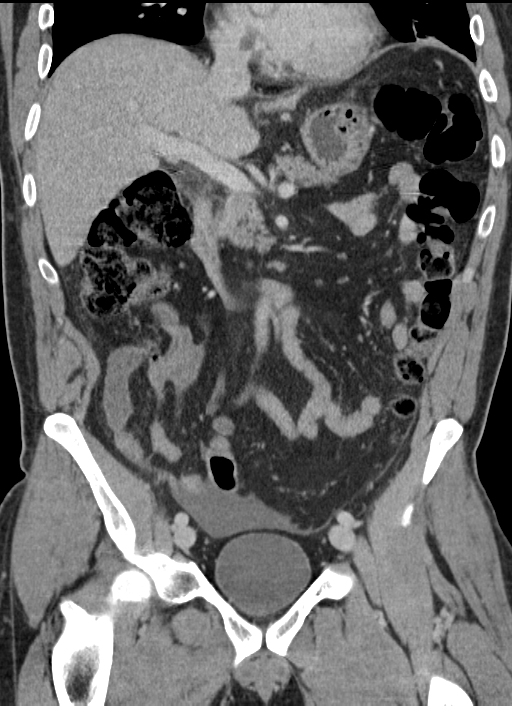
[im 71/127  soft-tissue]
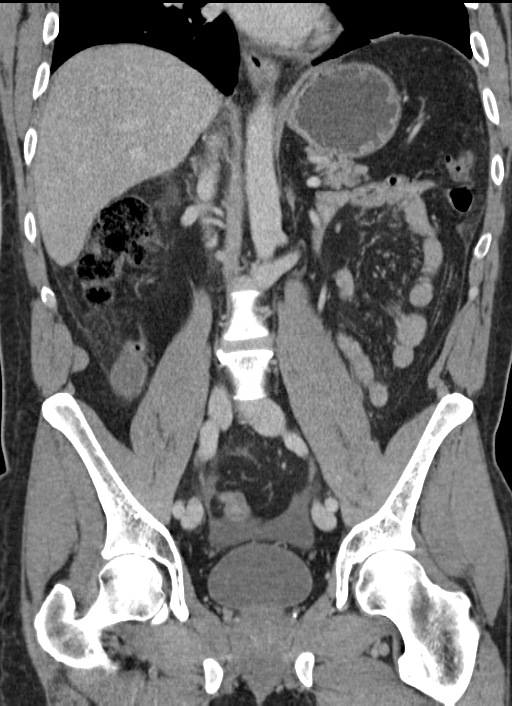

[15 of 46 positions shown; findings below may reference images not displayed]

FINDINGS: There are subsegmental atelectatic changes of the lung bases. There
is a small pneumoperitoneum, likely postoperative. Small free fluid
is noted within the pelvis as well as a small perihepatic free
fluid. Layering high attenuation within the pelvic fluid may
represent serosanguineous or proteinaceous content.

There is postsurgical changes of cholecystectomy with multiple
surgical clips at the cholecystectomy bed. There is diffuse
stranding of the fat at the operative bed as well as diffuse
stranding of the upper mesentery, likely postsurgical. No loculated
or walled-off fluid collection identified. No retained stone noted
in the CBD. There is no biliary ductal dilatation. The 1.5 cm left
hepatic hypodense lesion is not characterized but likely represent a
cyst. There is apparent mild fatty infiltration of the liver. The
pancreas, spleen, adrenal glands appear unremarkable. There is no
hydronephrosis on either side. Subcentimeter right renal hypodense
lesion are too small to characterize but likely represent cysts. The
visualized ureters, urinary bladder, prostate, and the seminal
vesicles are grossly unremarkable.

There is no evidence of bowel obstruction or active inflammation.
Normal appendix.

The abdominal aorta and IVC appear unremarkable. No portal venous
gas identified. The SMV, splenic vein, and main portal vein are
patent. There is no adenopathy.

There is a small fat containing left inguinal hernia. There is a
small fat containing umbilical hernia. There is haziness of the
periumbilical subcutaneous fat with multiple small pockets of gas,
likely related to laparoscopic port placement. No fluid collection
or hematoma. There is a small intramuscular lipoma in the right
transverse abdominis muscle. The osseous structures appear
unremarkable.
IMPRESSION: Postsurgical changes of cholecystectomy with diffuse stranding of
the upper abdominal mesentery and fat in the cholecystectomy bed. No
drainable fluid collection or abscess identified.

Small perihepatic as well as pelvic free fluid, likely
postoperative. A biliary leak is less likely but not entirely
excluded. A hepatobiliary scintigraphy may provide better evaluation
if there is clinical concern for biliary leak.

Multiple scattered small pockets of intraperitoneal air, likely post
cholecystectomy.

## 2016-02-04 IMAGING — NM NM HEPATOBILIARY IMAGE, INC GB
1 series · 6 of 6 positions shown · non-contrast
Comparison: CT scan abdomen/pelvis, same date.

CLINICAL DATA: Evaluate for bile leak. Recent laparoscopic
cholecystectomy.

EXAM:
NUCLEAR MEDICINE HEPATOBILIARY IMAGING
TECHNIQUE: Sequential images of the abdomen were obtained [DATE] minutes
following intravenous administration of radiopharmaceutical.
RADIOPHARMACEUTICALS:  5.1 mCi [34]  Choletec IV

[Series 1: hepato · 4.46mm/px · 6 of 60 frames shown]
[frame 6/60]
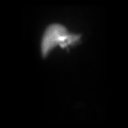
[frame 16/60]
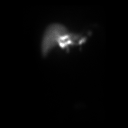
[frame 26/60]
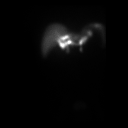
[frame 36/60]
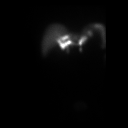
[frame 46/60]
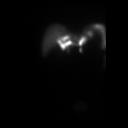
[frame 56/60]
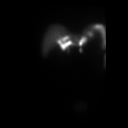

[6 of 6 positions shown; findings below may reference images not displayed]

FINDINGS: There is symmetric uptake in the liver and an immediate collection
of radiopharmaceutical in the right upper quadrant. This continues
across the midline under the left hemidiaphragm and down the left
paracolic gutter and into the pelvis. Findings are consistent with a
bile leak. This would also correlate with the CT scan which shows
fluid in the left paracolic gutter and moderate free fluid in the
pelvis.
IMPRESSION: Findings consistent with a bile leak.

## 2016-02-04 IMAGING — RF DG ABDOMEN 1V
1 series · 13 of 13 positions shown · non-contrast
Comparison: CT abdomen [DATE]

FLUOROSCOPY TIME:  3 minutes 53 seconds

Exposure:  147.24 mGy

CLINICAL DATA: Bile leak, stent placement

EXAM:
ABDOMEN - 1 VIEW; DG C-ARM 1-60 MIN-NO REPORT

[Series 1: run · 13 of 13 slices shown]
[im 1/13]
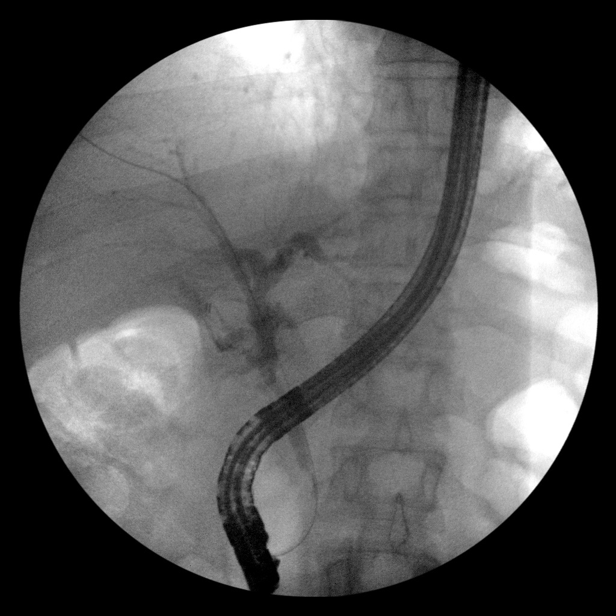
[im 2/13]
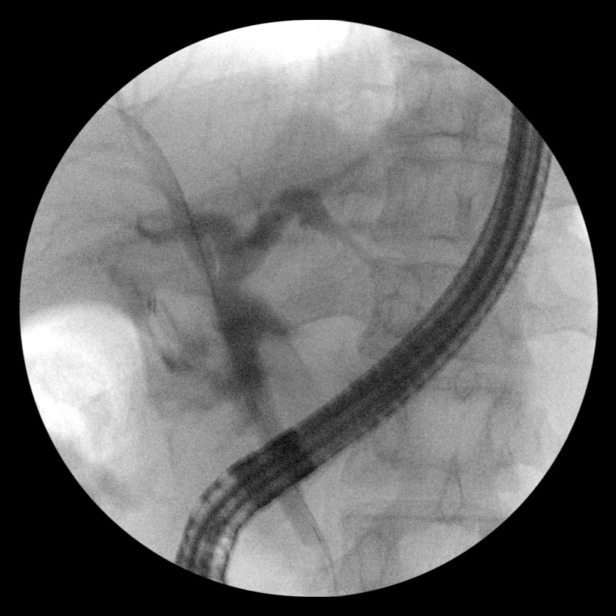
[im 3/13]
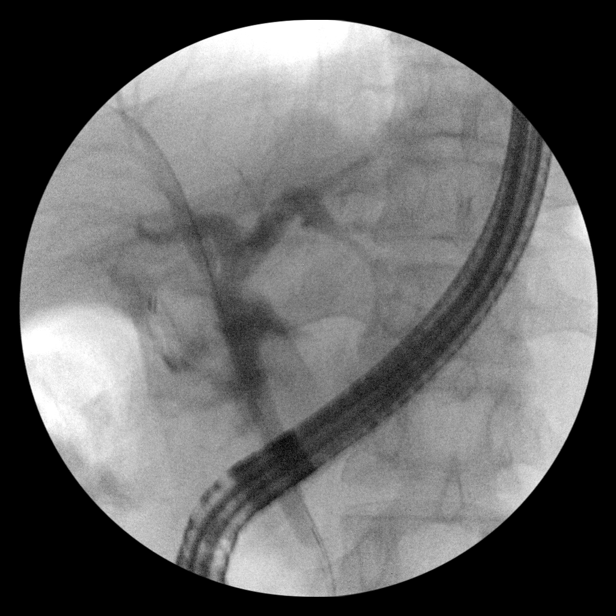
[im 4/13]
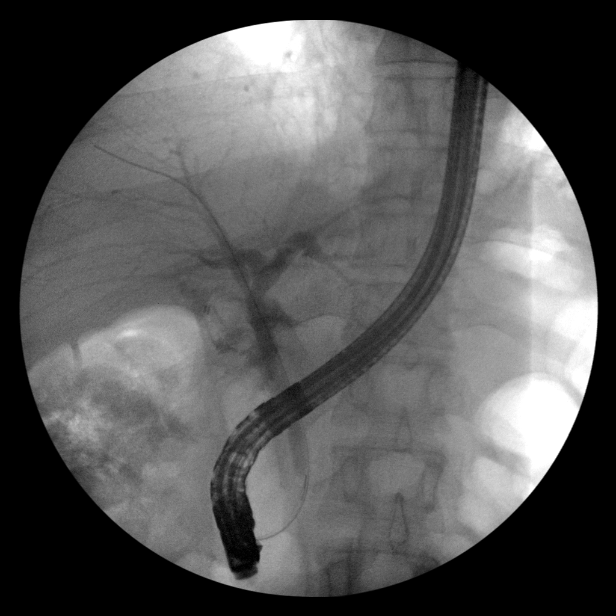
[im 5/13]
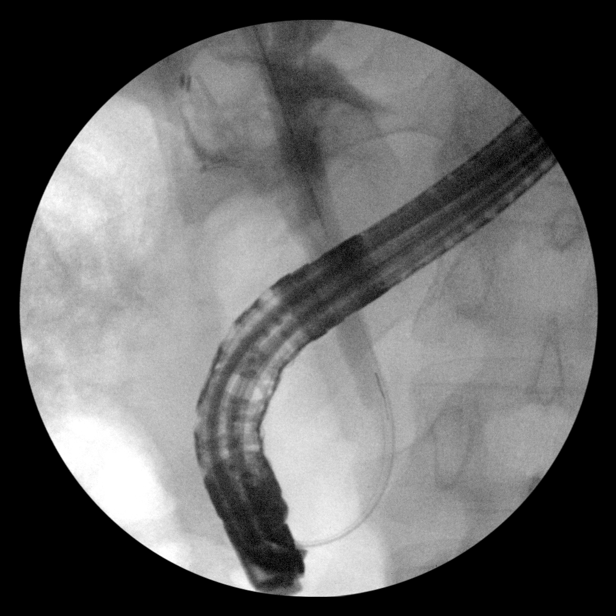
[im 6/13]
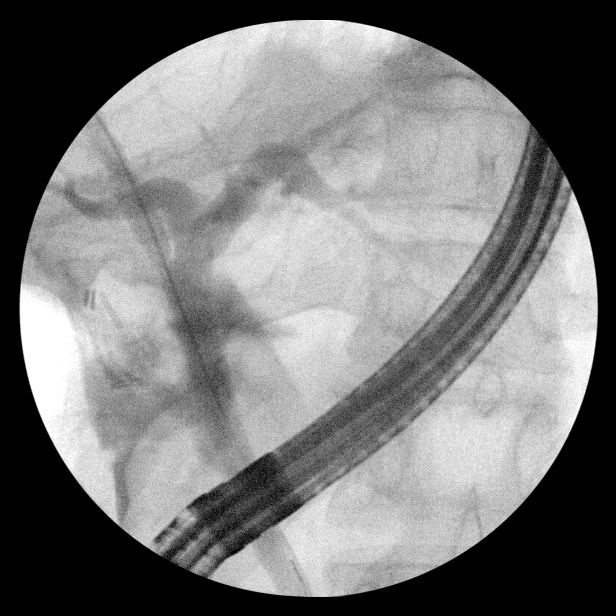
[im 7/13]
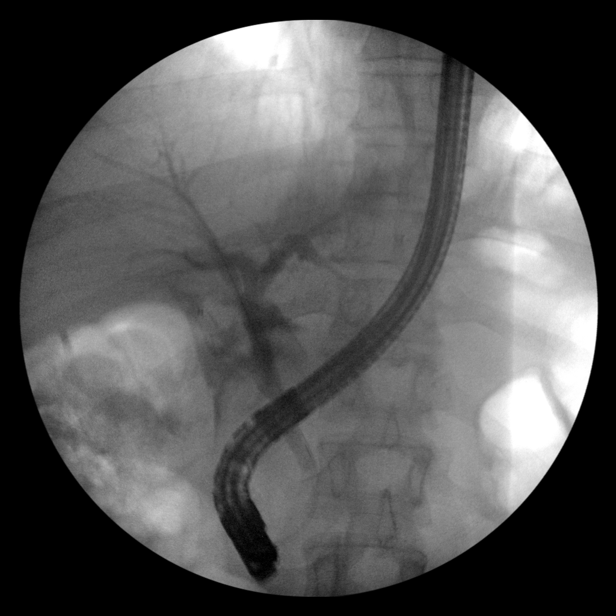
[im 8/13]
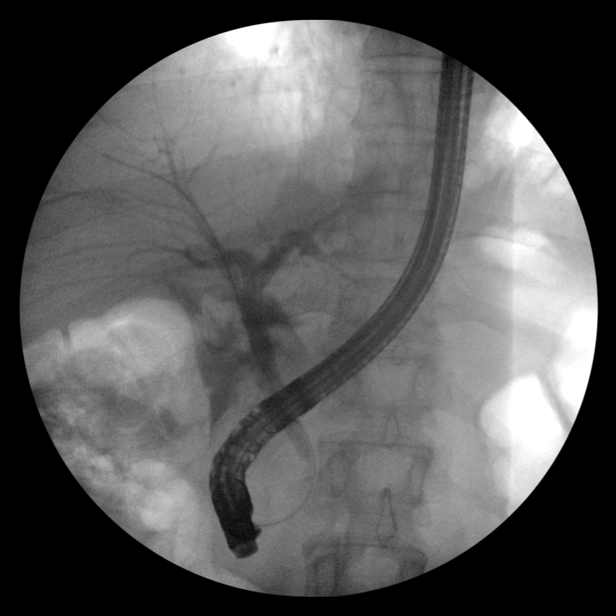
[im 9/13]
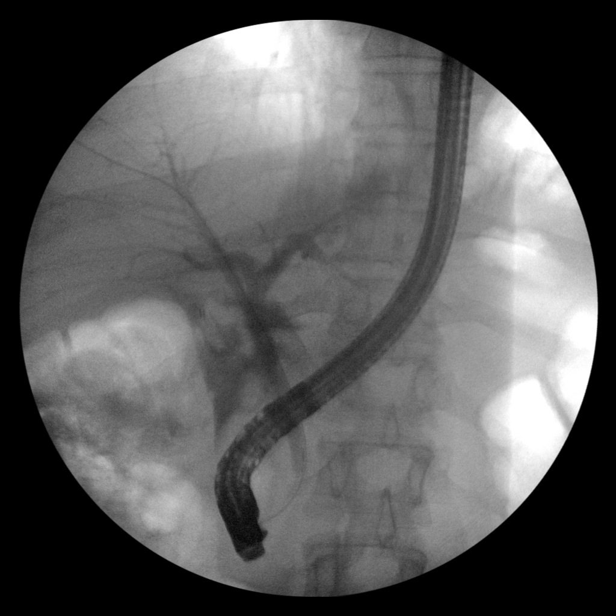
[im 10/13]
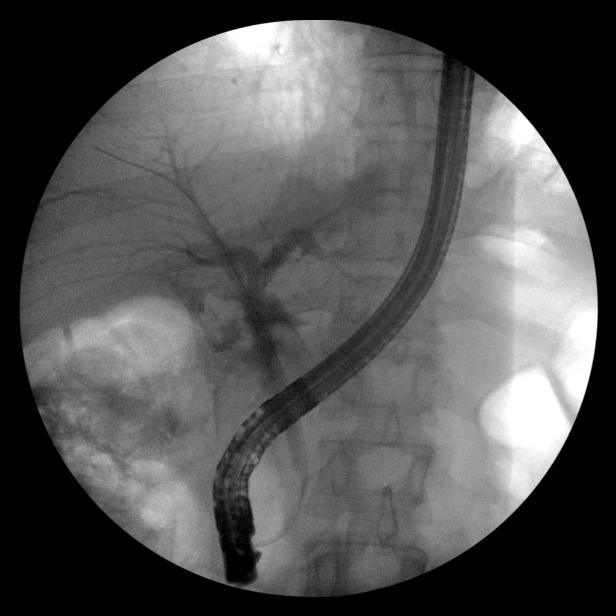
[im 11/13]
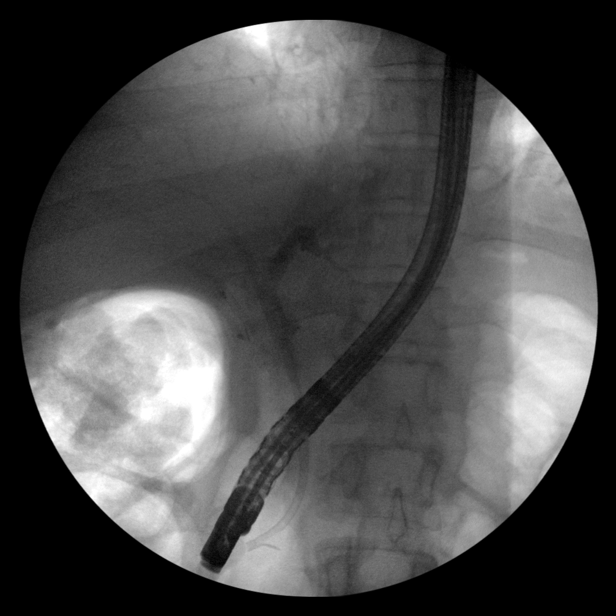
[im 12/13]
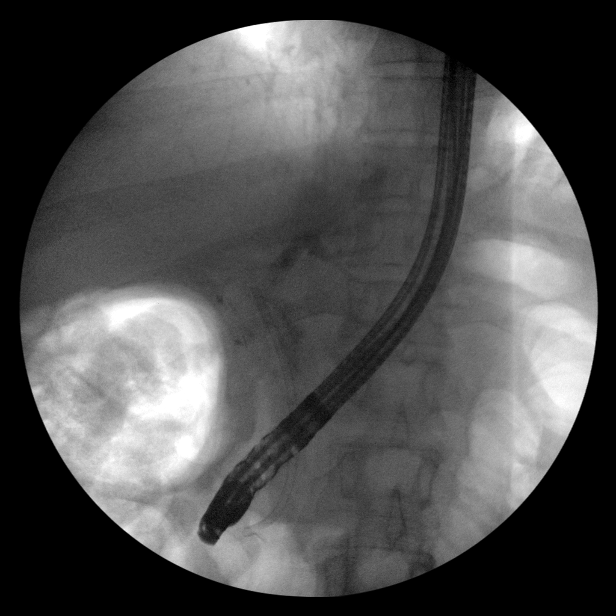
[im 13/13]
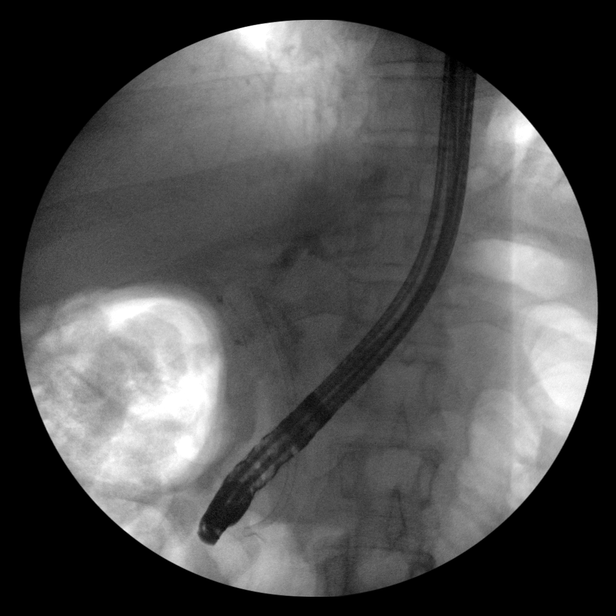

[13 of 13 positions shown; findings below may reference images not displayed]

FINDINGS: Multiple images submitted.

Surgical clips RIGHT upper quadrant from cholecystectomy.

Extravasated contrast material is seen at the gallbladder fossa
compatible with bile leak.

It is uncertain whether the extravasated contrast is arising from
the cystic duct stump or a duct of Luschka.

No definite filling defects identified within the opacified biliary
tree.

No biliary dilatation identified.

CBD stent placed.

Good drainage of contrast from the intrahepatic biliary tree
following stent placement.
IMPRESSION: Postoperative bile leak, for which a CBD stent was placed.

## 2016-02-04 IMAGING — NM NM HEPATOBILIARY IMAGE, INC GB
1 series · 1 of 1 positions shown · non-contrast
Comparison: CT scan abdomen/pelvis, same date.

CLINICAL DATA: Evaluate for bile leak. Recent laparoscopic
cholecystectomy.

EXAM:
NUCLEAR MEDICINE HEPATOBILIARY IMAGING
TECHNIQUE: Sequential images of the abdomen were obtained [DATE] minutes
following intravenous administration of radiopharmaceutical.
RADIOPHARMACEUTICALS:  5.1 mCi [34]  Choletec IV

[ant 70 min static · 1 of 1 slices shown]
[im 1/1]
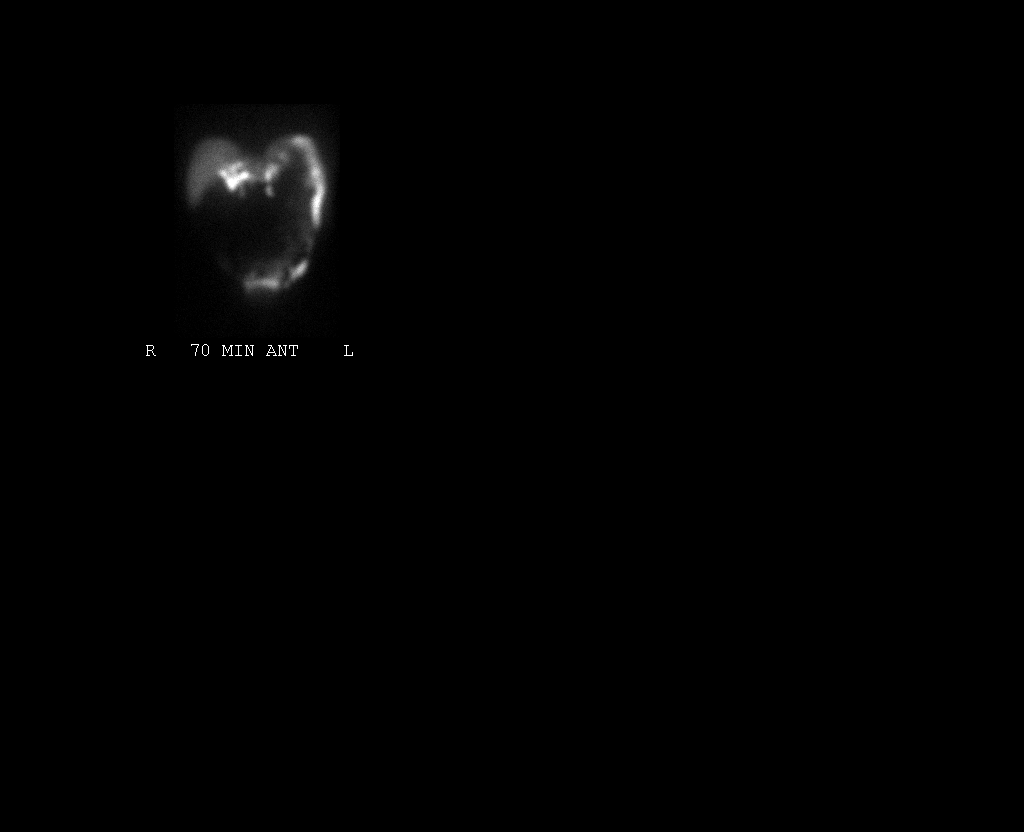

[1 of 1 positions shown; findings below may reference images not displayed]

FINDINGS: There is symmetric uptake in the liver and an immediate collection
of radiopharmaceutical in the right upper quadrant. This continues
across the midline under the left hemidiaphragm and down the left
paracolic gutter and into the pelvis. Findings are consistent with a
bile leak. This would also correlate with the CT scan which shows
fluid in the left paracolic gutter and moderate free fluid in the
pelvis.
IMPRESSION: Findings consistent with a bile leak.

## 2016-02-04 SURGERY — ERCP, WITH INTERVENTION IF INDICATED
Anesthesia: General

## 2016-02-04 SURGERY — Surgical Case
Anesthesia: *Unknown

## 2016-02-04 MED ORDER — FENTANYL CITRATE (PF) 250 MCG/5ML IJ SOLN
INTRAMUSCULAR | Status: AC
  Filled 2016-02-04: qty 5

## 2016-02-04 MED ORDER — SODIUM CHLORIDE 0.9 % IV SOLN
Freq: Once | INTRAVENOUS | Status: AC
  Administered 2016-02-04: 01:00:00 via INTRAVENOUS

## 2016-02-04 MED ORDER — MIDAZOLAM HCL 2 MG/2ML IJ SOLN
INTRAMUSCULAR | Status: DC | PRN
  Administered 2016-02-04: 2 mg via INTRAVENOUS

## 2016-02-04 MED ORDER — MIDAZOLAM HCL 2 MG/2ML IJ SOLN
INTRAMUSCULAR | Status: AC
  Filled 2016-02-04: qty 2

## 2016-02-04 MED ORDER — FENTANYL CITRATE (PF) 250 MCG/5ML IJ SOLN
INTRAMUSCULAR | Status: DC | PRN
  Administered 2016-02-04: 100 ug via INTRAVENOUS

## 2016-02-04 MED ORDER — IOPAMIDOL (ISOVUE-300) INJECTION 61%
100.0000 mL | Freq: Once | INTRAVENOUS | Status: AC | PRN
  Administered 2016-02-04: 100 mL via INTRAVENOUS

## 2016-02-04 MED ORDER — HYDROMORPHONE HCL 1 MG/ML IJ SOLN
1.0000 mg | Freq: Once | INTRAMUSCULAR | Status: AC
  Administered 2016-02-04: 1 mg via INTRAVENOUS
  Filled 2016-02-04: qty 1

## 2016-02-04 MED ORDER — LACTATED RINGERS IV SOLN: INTRAVENOUS | Status: DC

## 2016-02-04 MED ORDER — MEPERIDINE HCL 50 MG/ML IJ SOLN: 6.2500 mg | INTRAMUSCULAR | Status: DC | PRN

## 2016-02-04 MED ORDER — PIPERACILLIN-TAZOBACTAM 3.375 G IVPB
3.3750 g | Freq: Three times a day (TID) | INTRAVENOUS | Status: DC
  Administered 2016-02-04 – 2016-02-06 (×6): 3.375 g via INTRAVENOUS
  Filled 2016-02-04 (×7): qty 50

## 2016-02-04 MED ORDER — PROPOFOL 10 MG/ML IV BOLUS
INTRAVENOUS | Status: AC
  Filled 2016-02-04: qty 20

## 2016-02-04 MED ORDER — ONDANSETRON HCL 4 MG/2ML IJ SOLN
INTRAMUSCULAR | Status: AC
  Filled 2016-02-04: qty 2

## 2016-02-04 MED ORDER — ROCURONIUM BROMIDE 100 MG/10ML IV SOLN
INTRAVENOUS | Status: AC
  Filled 2016-02-04: qty 2

## 2016-02-04 MED ORDER — EPHEDRINE SULFATE 50 MG/ML IJ SOLN
INTRAMUSCULAR | Status: AC
  Filled 2016-02-04: qty 1

## 2016-02-04 MED ORDER — DIPHENHYDRAMINE HCL 25 MG PO CAPS: 25.0000 mg | ORAL_CAPSULE | Freq: Four times a day (QID) | ORAL | Status: DC | PRN

## 2016-02-04 MED ORDER — TECHNETIUM TC 99M MEBROFENIN IV KIT: 5.1000 | PACK | Freq: Once | INTRAVENOUS | Status: DC | PRN

## 2016-02-04 MED ORDER — KCL IN DEXTROSE-NACL 20-5-0.45 MEQ/L-%-% IV SOLN
INTRAVENOUS | Status: DC
  Administered 2016-02-04: 09:00:00 via INTRAVENOUS
  Administered 2016-02-04: 100 mL/h via INTRAVENOUS
  Administered 2016-02-05: 1000 mL via INTRAVENOUS
  Administered 2016-02-06: 07:00:00 via INTRAVENOUS
  Filled 2016-02-04 (×5): qty 1000

## 2016-02-04 MED ORDER — PHENYLEPHRINE 40 MCG/ML (10ML) SYRINGE FOR IV PUSH (FOR BLOOD PRESSURE SUPPORT)
PREFILLED_SYRINGE | INTRAVENOUS | Status: AC
  Filled 2016-02-04: qty 10

## 2016-02-04 MED ORDER — MORPHINE SULFATE (PF) 2 MG/ML IV SOLN
2.0000 mg | INTRAVENOUS | Status: DC | PRN
  Administered 2016-02-04: 4 mg via INTRAVENOUS
  Filled 2016-02-04: qty 2

## 2016-02-04 MED ORDER — GLUCAGON HCL RDNA (DIAGNOSTIC) 1 MG IJ SOLR
INTRAMUSCULAR | Status: AC
  Filled 2016-02-04: qty 1

## 2016-02-04 MED ORDER — SIMETHICONE 80 MG PO CHEW
80.0000 mg | CHEWABLE_TABLET | Freq: Four times a day (QID) | ORAL | Status: DC | PRN
  Administered 2016-02-05: 80 mg via ORAL
  Filled 2016-02-04: qty 1

## 2016-02-04 MED ORDER — LIDOCAINE HCL (CARDIAC) 20 MG/ML IV SOLN
INTRAVENOUS | Status: DC | PRN
  Administered 2016-02-04: 40 mg via INTRAVENOUS

## 2016-02-04 MED ORDER — IBUPROFEN 800 MG PO TABS
800.0000 mg | ORAL_TABLET | Freq: Four times a day (QID) | ORAL | Status: DC | PRN
  Administered 2016-02-04: 800 mg via ORAL
  Filled 2016-02-04: qty 1

## 2016-02-04 MED ORDER — PROPOFOL 10 MG/ML IV BOLUS
INTRAVENOUS | Status: DC | PRN
  Administered 2016-02-04: 200 mg via INTRAVENOUS

## 2016-02-04 MED ORDER — METOCLOPRAMIDE HCL 5 MG/ML IJ SOLN: 10.0000 mg | Freq: Once | INTRAMUSCULAR | Status: DC | PRN

## 2016-02-04 MED ORDER — LIDOCAINE HCL (CARDIAC) 20 MG/ML IV SOLN
INTRAVENOUS | Status: AC
  Filled 2016-02-04: qty 5

## 2016-02-04 MED ORDER — LACTATED RINGERS IV SOLN
INTRAVENOUS | Status: DC | PRN
  Administered 2016-02-04 (×2): via INTRAVENOUS

## 2016-02-04 MED ORDER — NEOSTIGMINE METHYLSULFATE 10 MG/10ML IV SOLN
INTRAVENOUS | Status: AC
  Filled 2016-02-04: qty 1

## 2016-02-04 MED ORDER — IOPAMIDOL (ISOVUE-370) INJECTION 76%
INTRAVENOUS | Status: DC | PRN
  Administered 2016-02-04: 100 mg via INTRAVENOUS

## 2016-02-04 MED ORDER — OXYCODONE-ACETAMINOPHEN 5-325 MG PO TABS
1.0000 | ORAL_TABLET | ORAL | Status: DC | PRN
  Administered 2016-02-05: 2 via ORAL
  Filled 2016-02-04: qty 2

## 2016-02-04 MED ORDER — DOCUSATE SODIUM 100 MG PO CAPS: 100.0000 mg | ORAL_CAPSULE | Freq: Two times a day (BID) | ORAL | Status: DC | PRN

## 2016-02-04 MED ORDER — FENTANYL CITRATE (PF) 100 MCG/2ML IJ SOLN: 25.0000 ug | INTRAMUSCULAR | Status: DC | PRN

## 2016-02-04 MED ORDER — GLYCOPYRROLATE 0.2 MG/ML IJ SOLN
INTRAMUSCULAR | Status: AC
  Filled 2016-02-04: qty 3

## 2016-02-04 MED ORDER — ONDANSETRON 4 MG PO TBDP: 4.0000 mg | ORAL_TABLET | Freq: Four times a day (QID) | ORAL | Status: DC | PRN

## 2016-02-04 MED ORDER — SUCCINYLCHOLINE CHLORIDE 20 MG/ML IJ SOLN
INTRAMUSCULAR | Status: DC | PRN
  Administered 2016-02-04: 100 mg via INTRAVENOUS

## 2016-02-04 MED ORDER — ONDANSETRON HCL 4 MG/2ML IJ SOLN: 4.0000 mg | Freq: Four times a day (QID) | INTRAMUSCULAR | Status: DC | PRN

## 2016-02-04 MED ORDER — BISACODYL 10 MG RE SUPP: 10.0000 mg | RECTAL | Status: DC | PRN

## 2016-02-04 MED ORDER — SODIUM CHLORIDE 0.9 % IJ SOLN
INTRAMUSCULAR | Status: AC
  Filled 2016-02-04: qty 10

## 2016-02-04 MED ORDER — GLUCAGON HCL RDNA (DIAGNOSTIC) 1 MG IJ SOLR
INTRAMUSCULAR | Status: DC | PRN
  Administered 2016-02-04: .5 mg via INTRAVENOUS

## 2016-02-04 MED ORDER — ENOXAPARIN SODIUM 40 MG/0.4ML ~~LOC~~ SOLN
40.0000 mg | SUBCUTANEOUS | Status: DC
  Administered 2016-02-04 – 2016-02-05 (×2): 40 mg via SUBCUTANEOUS
  Filled 2016-02-04 (×2): qty 0.4

## 2016-02-04 MED ORDER — DIPHENHYDRAMINE HCL 50 MG/ML IJ SOLN: 25.0000 mg | Freq: Four times a day (QID) | INTRAMUSCULAR | Status: DC | PRN

## 2016-02-04 MED ORDER — ONDANSETRON HCL 4 MG/2ML IJ SOLN
4.0000 mg | Freq: Once | INTRAMUSCULAR | Status: AC
  Administered 2016-02-04: 4 mg via INTRAVENOUS
  Filled 2016-02-04: qty 2

## 2016-02-04 MED ORDER — ONDANSETRON HCL 4 MG/2ML IJ SOLN
INTRAMUSCULAR | Status: DC | PRN
  Administered 2016-02-04: 4 mg via INTRAVENOUS

## 2016-02-04 NOTE — ED Triage Notes (Signed)
Pt had his gallbladder out Friday by Dr Rush Farmer  Pt went home that evening  Pt has had post op pain since but was controlled by pain medications  Pt had not had a bowel movement of pass gas since his surgery as of Sunday morning so they stopped his narcotic medication and started him on ibuprofen and gave him a suppository  Pt started passing gas Sunday afternoon and had a small mucousy stool that was blood tinged  Tonight about an hour ago pt had a sudden onset of pain just below the incision site  Pt states that area became very tight and felt warm inside  Pt states he has pain in his right shoulder as well  Pt took a percocet about 30 minutes after the pain started

## 2016-02-04 NOTE — Op Note (Signed)
Brief Op Note (full note to follow in AM, to be found in Procedures section of Epic)  Procedure: ERCP with plastic biliary stent placement  Indication: post-operative bile leak  Meds: GETA  Findings:  Bile leak from cystic duct stump.  No stones seen in CBD.  10 Fr x 7 cm plastic stent placed in bile duct.  Good position.  Excellent bile flow from stent.  Plan:    Ice chips for now, just in case he has to go to OR tomorrow for wash-out/drain.  Dr Havery Moros to follow for our group this week, and I am available if further ERCP assistance needed.

## 2016-02-04 NOTE — Anesthesia Postprocedure Evaluation (Signed)
Anesthesia Post Note  Patient: Raymond Kelly  Procedure(s) Performed: Procedure(s) (LRB): ENDOSCOPIC RETROGRADE CHOLANGIOPANCREATOGRAPHY (ERCP) (N/A)  Patient location during evaluation: PACU Anesthesia Type: General Level of consciousness: awake Pain management: pain level controlled Vital Signs Assessment: post-procedure vital signs reviewed and stable Respiratory status: spontaneous breathing Cardiovascular status: stable Anesthetic complications: no    Last Vitals:  Vitals:   02/04/16 1936 02/04/16 2015  BP: 140/85 127/83  Pulse: 76 61  Resp: 17   Temp: 36.9 C 36.7 C    Last Pain:  Vitals:   02/04/16 1354  TempSrc:   PainSc: 4                  EDWARDS,Job [place]tscl[place]

## 2016-02-04 NOTE — Progress Notes (Signed)
CTSP for white spot on uvula.  S/p lap chole last week, now back in the hospital for compilations of that surgery. Intubation was difficult with multiple attempts.This weekend his wife noted that the tip of his uvula looked white. No sore throat.  On exam today he appears ill. Abdominal discomfort. Denies any throat discomfort. 35mm distal uvula is white, otherwise uvula appearsnormal size and color.  A/P:  Ulcer of uvula, possible necrosis of tip of uvula.  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  No treatment necessary if discomfort or sore throat is not present.   [employer]

## 2016-02-04 NOTE — ED Notes (Signed)
Pt can go up at 07:45

## 2016-02-04 NOTE — Transfer of Care (Signed)
Immediate Anesthesia Transfer of Care Note  Patient: Raymond Kelly  Procedure(s) Performed: Procedure(s): ENDOSCOPIC RETROGRADE CHOLANGIOPANCREATOGRAPHY (ERCP) (N/A)  Patient Location: PACU  Anesthesia Type:General  Level of Consciousness: sedated  Airway & Oxygen Therapy: Patient Spontanous Breathing and Patient connected to face mask oxygen  Post-op Assessment: Report given to RN and Post -op Vital signs reviewed and stable  Post vital signs: Reviewed and stable  Last Vitals:  Vitals:   02/04/16 0730 02/04/16 0812  BP: 125/71 (!) 134/96  Pulse: 63 68  Resp:  20  Temp:  36.8 C    Last Pain:  Vitals:   02/04/16 1354  TempSrc:   PainSc: 4          Complications: No apparent anesthesia complications

## 2016-02-04 NOTE — Anesthesia Procedure Notes (Signed)
Procedure Name: Intubation Date/Time: 02/04/2016 6:30 PM Performed by: Cynda Familia Pre-anesthesia Checklist: Patient identified, Emergency Drugs available, Suction available, Patient being monitored and Timeout performed Patient Re-evaluated:Patient Re-evaluated prior to inductionOxygen Delivery Method: Circle system utilized Preoxygenation: Pre-oxygenation with 100% oxygen Intubation Type: IV induction and Rapid sequence Laryngoscope Size: Glidescope and 4 Grade View: Grade I Tube type: Oral Tube size: 7.0 mm Number of attempts: 1 Airway Equipment and Method: Stylet and Video-laryngoscopy Placement Confirmation: ETT inserted through vocal cords under direct vision,  breath sounds checked- equal and bilateral and CO2 detector Secured at: 21 cm Tube secured with: Tape Dental Injury: Teeth and Oropharynx as per pre-operative assessment  Comments: Smooth IV induction by Marcell Barlow--  Intubation AM CRNA atraumatic---   Teeth and mouth as preop--

## 2016-02-04 NOTE — Consult Note (Signed)
Consultation  Referring Provider:  CCSCreig Hines PA-C Primary Care Physician:  Eulas Post, MD Primary Gastroenterologist:  none  Reason for Consultation:  Bile leak  HPI: Raymond Kelly is a 45 y.o. male  who presented to the emergency room on 01/23/2016 with acute abdominal pain and workup with CT of the abdomen revealed multiple gallstones and a small amount of pericholecystic fluid and some fat stranding adjacent to the gallbladder. LFTs were normal but total bili was 1.3. He was referred to Dr. Ninfa [name], and scheduled for laparoscopic cholecystectomy on 02/01/2016. IOC was done and bile duct could be seen tapering but no contrast flowed into the duodenum, there did not appear to be a filling defect in the bile duct. Patient was discharged home the same day. He developed some pain in the right abdomen after the surgery, but he presented back to the emergency room early this morning after he developed worsening with severe right flank pain last night. CT of the abdomen and pelvis was repeated and showed postsurgical changes of cholecystectomy with diffuse stranding of the upper abdominal mesentery and fat in the cholecystectomy bed, there is no drainable fluid collection or abscess identified. There is a small amount of. Hepatic as well as pelvic free fluid, a biliary leak not entirely excluded. There are multiple scattered small pockets of intraperitoneal air likely post cholecystectomy. HIDA scan has been done and is positive for a bile leak WBC is 11.6 today, total bili 3.9, AST 157 and ALT 191. Patient has been admitted to the surgery service. He complains of 6/10 pain at this time.   Of note, he developed uvulitis following his surgery, likely due to intubation, but denies any significant pain.    Past Medical History:  Diagnosis Date  . Headache   . Kidney stones     Past Surgical History:  Procedure Laterality Date  . CHOLECYSTECTOMY    . CHOLECYSTECTOMY N/A 02/01/2016   Procedure: LAPAROSCOPIC CHOLECYSTECTOMY WITH INTRAOPERATIVE CHOLANGIOGRAM;  Surgeon: Coralie Keens, MD;  Location: [facility];  Service: General;  Laterality: N/A;  . cyst removed     between his front teeth and cyst removed from abdomal area  . TONSILLECTOMY    . ULNAR NERVE TRANSPOSITION      Prior to Admission medications   Medication Sig Start Date End Date Taking? Authorizing Provider  bisacodyl (BISAC-EVAC) 10 MG suppository Place 10 mg rectally as needed for moderate constipation.   Yes Historical Provider, MD  docusate sodium (COLACE) 100 MG capsule Take 100 mg by mouth 2 (two) times daily as needed for mild constipation.   Yes Historical Provider, MD  ibuprofen (ADVIL,MOTRIN) 200 MG tablet Take 800 mg by mouth every 6 (six) hours as needed (for pain.).   Yes Historical Provider, MD  ondansetron (ZOFRAN) 4 MG tablet Take 1 tablet (4 mg total) by mouth every 6 (six) hours. 01/23/16  Yes Virgel Manifold, MD  oxyCODONE-acetaminophen (PERCOCET/ROXICET) 5-325 MG tablet Take 1-2 tablets by mouth every 4 (four) hours as needed for moderate pain or severe pain. 02/01/16  Yes Coralie Keens, MD  simethicone (MYLICON) 80 MG chewable tablet Chew 80-160 mg by mouth every 6 (six) hours as needed for flatulence.   Yes Historical Provider, MD    Current Facility-Administered Medications  Medication Dose Route Frequency Provider Last Rate Last Dose  . bisacodyl (DULCOLAX) suppository 10 mg  10 mg Rectal PRN Leighton Ruff, MD      . dextrose 5 % and 0.45 % NaCl with  KCl 20 mEq/L infusion   Intravenous Continuous Leighton Ruff, MD [id number] mL/hr at 02/04/16 0919    . diphenhydrAMINE (BENADRYL) capsule 25 mg  25 mg Oral [id number] PRN Leighton Ruff, MD       Or  . diphenhydrAMINE (BENADRYL) injection 25 mg  25 mg Intravenous [id number] PRN Leighton Ruff, MD      . docusate sodium (COLACE) capsule 100 mg  100 mg Oral BID PRN Leighton Ruff, MD      . enoxaparin (LOVENOX) injection 40 mg  40 mg Subcutaneous [id number] Leighton Ruff,  MD      . ibuprofen (ADVIL,MOTRIN) tablet 800 mg  800 mg Oral [id number] PRN Leighton Ruff, MD   [id number] mg at 02/04/16 1254  . morphine 2 MG/ML injection 2-4 mg  2-4 mg Intravenous [id number] PRN Leighton Ruff, MD   4 mg at 02/04/16 1058  . ondansetron (ZOFRAN-ODT) disintegrating tablet 4 mg  4 mg Oral [id number] PRN Leighton Ruff, MD       Or  . ondansetron [facility]) injection 4 mg  4 mg Intravenous [id number] PRN Leighton Ruff, MD      . oxyCODONE-acetaminophen (PERCOCET/ROXICET) 5-325 MG per tablet 1-2 tablet  1-2 tablet Oral [id number] PRN Leighton Ruff, MD      . piperacillin-tazobactam (ZOSYN) IVPB 3.375 g  3.375 g Intravenous Q8H Jackolyn Confer, MD      . simethicone [facility]) chewable tablet 80-160 mg  80-160 mg Oral [id number] PRN Leighton Ruff, MD      . technetium TC 74M mebrofenin (CHOLETEC) injection 5 millicurie  5 millicurie Intravenous Once PRN [employer] MD        Allergies as of 02/04/2016 - Review Complete 02/04/2016  Allergen Reaction Noted  . No known allergies  01/31/2016    History reviewed. No pertinent family history.  Social History   Social History  . Marital status: Married    Spouse name: N/A  . Number of children: N/A  . Years of education: N/A   Occupational History  . Not on file.   Social History Main Topics  . Smoking status: Never Smoker  . Smokeless tobacco: Never Used  . Alcohol use No     Comment: social at times  . Drug use: No  . Sexual activity: Not on file   Other Topics Concern  . Not on file   Social History Narrative  . No narrative on file    Review of Systems: Pertinent positive and negative review of systems were noted in the above HPI section.  All other review of systems was otherwise negative.  Physical Exam: Vital signs in last 24 hours: Temp:  [97.7 F (36.5 C)-98.2 F (36.8 C)] 98.2 F (36.8 C) (07/31 0812) Pulse Rate:  [60-80] 68 (07/31 0812) Resp:  [16-20] 20 (07/31 0812) BP: (125-169)/(71-104) 134/96 (07/31 0812) SpO2:  [93 %-97 %] 97 %  (07/31 0812) Weight:  [184 lb (83.5 kg)] 184 lb (83.5 kg) (07/31 0020) Last BM Date: 01/31/16 General:   Alert,  Well-developed, well-nourished, pleasant and cooperative in NAD Head:  Normocephalic and atraumatic. Eyes:  Sclera clear, no icterus.   Conjunctiva pink. Ears:  Normal auditory acuity. Nose:  No deformity, discharge,  or lesions. Mouth:  Evidence of uvulitis with some edema / ulceration at tip of uvula Neck:  Supple; no masses or thyromegaly. Lungs:  Clear throughout to auscultation.   No wheezes, crackles, or rhonchi.  Heart:  Regular rate and rhythm; no murmurs, clicks, rubs,  or gallops. Abdomen:  Soft, right sided abdominal tenderness to palpation without rebound or guarding Rectal:  Deferred  Msk:  Symmetrical without gross deformities. . Pulses:  Normal pulses noted. Extremities:  Without clubbing or edema. Neurologic:  Alert and  oriented x4;  grossly normal neurologically. Skin:  Intact without significant lesions or rashes.. Psych:  Alert and cooperative. Normal mood and affect.  Intake/Output from previous day: No intake/output data recorded. Intake/Output this shift: No intake/output data recorded.  Lab Results:  Recent Labs  02/04/16 0110  WBC 11.6*  HGB 15.5  HCT 44.8  PLT 246   BMET  Recent Labs  02/04/16 0110  NA 137  K 4.4  CL 101  CO2 27  GLUCOSE 122*  BUN 13  CREATININE 0.83  CALCIUM 8.9   LFT  Recent Labs  02/04/16 0110  PROT 7.8  ALBUMIN 4.4  AST 157*  ALT 191*  ALKPHOS 102  BILITOT 3.9*   PT/INR No results for input(s): LABPROT, INR in the last 72 hours. Hepatitis Panel No results for input(s): HEPBSAG, HCVAB, HEPAIGM, HEPBIGM in the last 72 hours.    Studies/Results: HIDA and CT SCAN as above  IMPRESSION / PLAN: 45 y/o male who is s/p cholecystectomy, who developed pain in the RUQ post-operatively, found to have a bile leak noted on HIDA scan. LAEs as above. He also had an abnormal cholangiogram with possibility  for a retained gallstone.   Discussed findings with patient and his wife. ERCP is recommended to treat bile leak and potentially remove any retained gallstone. I discussed risks / benefits of ERCP with them, as well as anesthesia. Dr. Loletha Carrow is covering ERCP and graciously is available this evening for the procedure. He has Zosyn ordered for him in the interim. Further recommendations pending results of the exam. They agreed with the plan.   Irvington Cellar, MD [facility] Pager [phone]

## 2016-02-04 NOTE — H&P (Signed)
CALIEB LICHTMAN is an 45 y.o. male.   Chief Complaint: R flank pain HPI: 45 y.o. M s/p lap chole on 7/28 by Dr Ninfa [name].  Cholangiogram was performed, which showed no stones but contrast did not pass into duodenum.  He states that he has had vague "gas pains" since surgery and has been treating himself for constipation.  Last night he developed severe pain in his R flank that lasted for about 1 hr.  He came to the ED to be evaluated.    Past Medical History:  Diagnosis Date  . Headache   . Kidney stones     Past Surgical History:  Procedure Laterality Date  . cyst removed     between his front teeth and cyst removed from abdomal area  . TONSILLECTOMY    . ULNAR NERVE TRANSPOSITION      History reviewed. No pertinent family history. Social History:  reports that he has never smoked. He has never used smokeless tobacco. He reports that he does not drink alcohol or use drugs.  Allergies:  Allergies  Allergen Reactions  . No Known Allergies      (Not in a hospital admission)  Results for orders placed or performed during the hospital encounter of 02/04/16 (from the past 48 hour(s))  Lipase, blood     Status: None   Collection Time: 02/04/16  1:10 AM  Result Value Ref Range   Lipase 21 11 - 51 U/L  CBC with Differential/Platelet     Status: Abnormal   Collection Time: 02/04/16  1:10 AM  Result Value Ref Range   WBC 11.6 (H) 4.0 - 10.5 K/uL   RBC 4.87 4.22 - 5.81 MIL/uL   Hemoglobin 15.5 13.0 - 17.0 g/dL   HCT 44.8 39.0 - 52.0 %   MCV 92.0 78.0 - 100.0 fL   MCH 31.8 26.0 - 34.0 pg   MCHC 34.6 30.0 - 36.0 g/dL   RDW 12.5 11.5 - 15.5 %   Platelets 246 150 - 400 K/uL   Neutrophils Relative % 78 %   Neutro Abs 9.1 (H) 1.7 - 7.7 K/uL   Lymphocytes Relative 9 %   Lymphs Abs 1.0 0.7 - 4.0 K/uL   Monocytes Relative 12 %   Monocytes Absolute 1.4 (H) 0.1 - 1.0 K/uL   Eosinophils Relative 1 %   Eosinophils Absolute 0.1 0.0 - 0.7 K/uL   Basophils Relative 0 %   Basophils Absolute  0.0 0.0 - 0.1 K/uL  Comprehensive metabolic panel     Status: Abnormal   Collection Time: 02/04/16  1:10 AM  Result Value Ref Range   Sodium 137 135 - 145 mmol/L   Potassium 4.4 3.5 - 5.1 mmol/L   Chloride 101 101 - 111 mmol/L   CO2 27 22 - 32 mmol/L   Glucose, Bld 122 (H) 65 - 99 mg/dL   BUN 13 6 - 20 mg/dL   Creatinine, Ser 0.83 0.61 - 1.24 mg/dL   Calcium 8.9 8.9 - 10.3 mg/dL   Total Protein 7.8 6.5 - 8.1 g/dL   Albumin 4.4 3.5 - 5.0 g/dL   AST 157 (H) 15 - 41 U/L   ALT 191 (H) 17 - 63 U/L   Alkaline Phosphatase 102 38 - 126 U/L   Total Bilirubin 3.9 (H) 0.3 - 1.2 mg/dL   GFR calc non Af Amer >60 >60 mL/min   GFR calc Af Amer >60 >60 mL/min    Comment: (NOTE) The eGFR has been calculated using the  CKD EPI equation. This calculation has not been validated in all clinical situations. eGFR's persistently <60 mL/min signify possible Chronic Kidney Disease.    Anion gap 9 5 - 15   Ct Abdomen Pelvis W Contrast  Result Date: 02/04/2016 CLINICAL DATA:  45 year old male status post cholecystectomy on 02/01/2016 presenting with abdominal pain at the surgical site. EXAM: CT ABDOMEN AND PELVIS WITH CONTRAST TECHNIQUE: Multidetector CT imaging of the abdomen and pelvis was performed using the standard protocol following bolus administration of intravenous contrast. CONTRAST:  161m ISOVUE-300 IOPAMIDOL (ISOVUE-300) INJECTION 61% COMPARISON:  CT dated 01/23/2016 FINDINGS: There are subsegmental atelectatic changes of the lung bases. There is a small pneumoperitoneum, likely postoperative. Small free fluid is noted within the pelvis as well as a small perihepatic free fluid. Layering high attenuation within the pelvic fluid may represent serosanguineous or proteinaceous content. There is postsurgical changes of cholecystectomy with multiple surgical clips at the cholecystectomy bed. There is diffuse stranding of the fat at the operative bed as well as diffuse stranding of the upper mesentery,  likely postsurgical. No loculated or walled-off fluid collection identified. No retained stone noted in the CBD. There is no biliary ductal dilatation. The 1.5 cm left hepatic hypodense lesion is not characterized but likely represent a cyst. There is apparent mild fatty infiltration of the liver. The pancreas, spleen, adrenal glands appear unremarkable. There is no hydronephrosis on either side. Subcentimeter right renal hypodense lesion are too small to characterize but likely represent cysts. The visualized ureters, urinary bladder, prostate, and the seminal vesicles are grossly unremarkable. There is no evidence of bowel obstruction or active inflammation. Normal appendix. The abdominal aorta and IVC appear unremarkable. No portal venous gas identified. The SMV, splenic vein, and main portal vein are patent. There is no adenopathy. There is a small fat containing left inguinal hernia. There is a small fat containing umbilical hernia. There is haziness of the periumbilical subcutaneous fat with multiple small pockets of gas, likely related to laparoscopic port placement. No fluid collection or hematoma. There is a small intramuscular lipoma in the right transverse abdominis muscle. The osseous structures appear unremarkable. IMPRESSION: Postsurgical changes of cholecystectomy with diffuse stranding of the upper abdominal mesentery and fat in the cholecystectomy bed. No drainable fluid collection or abscess identified. Small perihepatic as well as pelvic free fluid, likely postoperative. A biliary leak is less likely but not entirely excluded. A hepatobiliary scintigraphy may provide better evaluation if there is clinical concern for biliary leak. Multiple scattered small pockets of intraperitoneal air, likely post cholecystectomy. Electronically Signed   By: AAnner CreteM.D.   On: 02/04/2016 03:25   ROS  Blood pressure 154/96, pulse 61, temperature 97.7 F (36.5 C), temperature source Oral, resp. rate  16, height _0  (1.727 m), weight 83.5 kg (184 lb), SpO2 94 %. Physical Exam   Assessment/Plan 45y.o. M with hyperbilirubinemia s/p lap chole 3 days ago.  CT scan shows no signs of obvious fluid collection but cannot completely exclude bile leak.  His bilirubin is elevated at 3.9.  Most likely the patient has a retained stone.  He could be developing a leak as well though.  Will discuss with oncoming MD regarding getting a HIDA vs MRCP.    TRosario Adie, MD 71/28/7867 6:04 AM

## 2016-02-04 NOTE — ED Notes (Addendum)
Per NM, Pt will not have study until after 1000.  Pt will need to be kept NPO and cannot receive opioids prior to study.

## 2016-02-04 NOTE — H&P (View-Only) (Signed)
Consultation  Referring Provider:  CCSCreig Hines PA-C Primary Care Physician:  Eulas Post, MD Primary Gastroenterologist:  none  Reason for Consultation:  Bile leak  HPI: Raymond Kelly is a 45 y.o. male  who presented to the emergency room on 01/23/2016 with acute abdominal pain and workup with CT of the abdomen revealed multiple gallstones and a small amount of pericholecystic fluid and some fat stranding adjacent to the gallbladder. LFTs were normal but total bili was 1.3. He was referred to Dr. Ninfa [name], and scheduled for laparoscopic cholecystectomy on 02/01/2016. IOC was done and bile duct could be seen tapering but no contrast flowed into the duodenum, there did not appear to be a filling defect in the bile duct. Patient was discharged home the same day. He developed some pain in the right abdomen after the surgery, but he presented back to the emergency room early this morning after he developed worsening with severe right flank pain last night. CT of the abdomen and pelvis was repeated and showed postsurgical changes of cholecystectomy with diffuse stranding of the upper abdominal mesentery and fat in the cholecystectomy bed, there is no drainable fluid collection or abscess identified. There is a small amount of. Hepatic as well as pelvic free fluid, a biliary leak not entirely excluded. There are multiple scattered small pockets of intraperitoneal air likely post cholecystectomy. HIDA scan has been done and is positive for a bile leak WBC is 11.6 today, total bili 3.9, AST 157 and ALT 191. Patient has been admitted to the surgery service. He complains of 6/10 pain at this time.   Of note, he developed uvulitis following his surgery, likely due to intubation, but denies any significant pain.    Past Medical History:  Diagnosis Date  . Headache   . Kidney stones     Past Surgical History:  Procedure Laterality Date  . CHOLECYSTECTOMY    . CHOLECYSTECTOMY N/A 02/01/2016   Procedure: LAPAROSCOPIC CHOLECYSTECTOMY WITH INTRAOPERATIVE CHOLANGIOGRAM;  Surgeon: Coralie Keens, MD;  Location: [facility];  Service: General;  Laterality: N/A;  . cyst removed     between his front teeth and cyst removed from abdomal area  . TONSILLECTOMY    . ULNAR NERVE TRANSPOSITION      Prior to Admission medications   Medication Sig Start Date End Date Taking? Authorizing Provider  bisacodyl (BISAC-EVAC) 10 MG suppository Place 10 mg rectally as needed for moderate constipation.   Yes Historical Provider, MD  docusate sodium (COLACE) 100 MG capsule Take 100 mg by mouth 2 (two) times daily as needed for mild constipation.   Yes Historical Provider, MD  ibuprofen (ADVIL,MOTRIN) 200 MG tablet Take 800 mg by mouth every 6 (six) hours as needed (for pain.).   Yes Historical Provider, MD  ondansetron (ZOFRAN) 4 MG tablet Take 1 tablet (4 mg total) by mouth every 6 (six) hours. 01/23/16  Yes Virgel Manifold, MD  oxyCODONE-acetaminophen (PERCOCET/ROXICET) 5-325 MG tablet Take 1-2 tablets by mouth every 4 (four) hours as needed for moderate pain or severe pain. 02/01/16  Yes Coralie Keens, MD  simethicone (MYLICON) 80 MG chewable tablet Chew 80-160 mg by mouth every 6 (six) hours as needed for flatulence.   Yes Historical Provider, MD    Current Facility-Administered Medications  Medication Dose Route Frequency Provider Last Rate Last Dose  . bisacodyl (DULCOLAX) suppository 10 mg  10 mg Rectal PRN Leighton Ruff, MD      . dextrose 5 % and 0.45 % NaCl with  KCl 20 mEq/L infusion   Intravenous Continuous Leighton Ruff, MD [id number] mL/hr at 02/04/16 0919    . diphenhydrAMINE (BENADRYL) capsule 25 mg  25 mg Oral [id number] PRN Leighton Ruff, MD       Or  . diphenhydrAMINE (BENADRYL) injection 25 mg  25 mg Intravenous [id number] PRN Leighton Ruff, MD      . docusate sodium (COLACE) capsule 100 mg  100 mg Oral BID PRN Leighton Ruff, MD      . enoxaparin (LOVENOX) injection 40 mg  40 mg Subcutaneous [id number] Leighton Ruff,  MD      . ibuprofen (ADVIL,MOTRIN) tablet 800 mg  800 mg Oral [id number] PRN Leighton Ruff, MD   [id number] mg at 02/04/16 1254  . morphine 2 MG/ML injection 2-4 mg  2-4 mg Intravenous [id number] PRN Leighton Ruff, MD   4 mg at 02/04/16 1058  . ondansetron (ZOFRAN-ODT) disintegrating tablet 4 mg  4 mg Oral [id number] PRN Leighton Ruff, MD       Or  . ondansetron [facility]) injection 4 mg  4 mg Intravenous [id number] PRN Leighton Ruff, MD      . oxyCODONE-acetaminophen (PERCOCET/ROXICET) 5-325 MG per tablet 1-2 tablet  1-2 tablet Oral [id number] PRN Leighton Ruff, MD      . piperacillin-tazobactam (ZOSYN) IVPB 3.375 g  3.375 g Intravenous Q8H Jackolyn Confer, MD      . simethicone [facility]) chewable tablet 80-160 mg  80-160 mg Oral [id number] PRN Leighton Ruff, MD      . technetium TC 15M mebrofenin (CHOLETEC) injection 5 millicurie  5 millicurie Intravenous Once PRN [employer] MD        Allergies as of 02/04/2016 - Review Complete 02/04/2016  Allergen Reaction Noted  . No known allergies  01/31/2016    History reviewed. No pertinent family history.  Social History   Social History  . Marital status: Married    Spouse name: N/A  . Number of children: N/A  . Years of education: N/A   Occupational History  . Not on file.   Social History Main Topics  . Smoking status: Never Smoker  . Smokeless tobacco: Never Used  . Alcohol use No     Comment: social at times  . Drug use: No  . Sexual activity: Not on file   Other Topics Concern  . Not on file   Social History Narrative  . No narrative on file    Review of Systems: Pertinent positive and negative review of systems were noted in the above HPI section.  All other review of systems was otherwise negative.  Physical Exam: Vital signs in last 24 hours: Temp:  [97.7 F (36.5 C)-98.2 F (36.8 C)] 98.2 F (36.8 C) (07/31 0812) Pulse Rate:  [60-80] 68 (07/31 0812) Resp:  [16-20] 20 (07/31 0812) BP: (125-169)/(71-104) 134/96 (07/31 0812) SpO2:  [93 %-97 %] 97 %  (07/31 0812) Weight:  [184 lb (83.5 kg)] 184 lb (83.5 kg) (07/31 0020) Last BM Date: 01/31/16 General:   Alert,  Well-developed, well-nourished, pleasant and cooperative in NAD Head:  Normocephalic and atraumatic. Eyes:  Sclera clear, no icterus.   Conjunctiva pink. Ears:  Normal auditory acuity. Nose:  No deformity, discharge,  or lesions. Mouth:  Evidence of uvulitis with some edema / ulceration at tip of uvula Neck:  Supple; no masses or thyromegaly. Lungs:  Clear throughout to auscultation.   No wheezes, crackles, or rhonchi.  Heart:  Regular rate and rhythm; no murmurs, clicks, rubs,  or gallops. Abdomen:  Soft, right sided abdominal tenderness to palpation without rebound or guarding Rectal:  Deferred  Msk:  Symmetrical without gross deformities. . Pulses:  Normal pulses noted. Extremities:  Without clubbing or edema. Neurologic:  Alert and  oriented x4;  grossly normal neurologically. Skin:  Intact without significant lesions or rashes.. Psych:  Alert and cooperative. Normal mood and affect.  Intake/Output from previous day: No intake/output data recorded. Intake/Output this shift: No intake/output data recorded.  Lab Results:  Recent Labs  02/04/16 0110  WBC 11.6*  HGB 15.5  HCT 44.8  PLT 246   BMET  Recent Labs  02/04/16 0110  NA 137  K 4.4  CL 101  CO2 27  GLUCOSE 122*  BUN 13  CREATININE 0.83  CALCIUM 8.9   LFT  Recent Labs  02/04/16 0110  PROT 7.8  ALBUMIN 4.4  AST 157*  ALT 191*  ALKPHOS 102  BILITOT 3.9*   PT/INR No results for input(s): LABPROT, INR in the last 72 hours. Hepatitis Panel No results for input(s): HEPBSAG, HCVAB, HEPAIGM, HEPBIGM in the last 72 hours.    Studies/Results: HIDA and CT SCAN as above  IMPRESSION / PLAN: 45 y/o male who is s/p cholecystectomy, who developed pain in the RUQ post-operatively, found to have a bile leak noted on HIDA scan. LAEs as above. He also had an abnormal cholangiogram with possibility  for a retained gallstone.   Discussed findings with patient and his wife. ERCP is recommended to treat bile leak and potentially remove any retained gallstone. I discussed risks / benefits of ERCP with them, as well as anesthesia. Dr. Loletha Carrow is covering ERCP and graciously is available this evening for the procedure. He has Zosyn ordered for him in the interim. Further recommendations pending results of the exam. They agreed with the plan.   Centre Cellar, MD [facility] Pager [phone]

## 2016-02-04 NOTE — ED Provider Notes (Signed)
[facility] DEPT Provider Note   CSN: [id number] Arrival date & time: 02/04/16  0015  First Provider Contact:  First MD Initiated Contact with Patient 02/04/16 0041     By signing my name below, I, Raymond Kelly, attest that this documentation has been prepared under the direction and in the presence of Raymond Rosser, MD . Electronically Signed: Higinio Kelly, Scribe. 02/04/16. 12:59 AM.  History   Chief Complaint Chief Complaint  Patient presents with  . Post-op Problem   HPI Comments: Raymond Kelly is a 45 y.o. male with PMHx of kidney stones s/p cholecystectomy by Dr. Unknown [name] 3 days ago. He presents to the Emergency Department complaining of 10/10, right-sided abdominal pain  that began ~1 hour PTA. He reports his pain is located just under One of his laparoscopy incision sites and feels as if it is "on fire" He notes associated muscle spasms and chills. He states his pain radiates to his left shoulder. Pt reports he began to pass gas and had a BM that was "mucousy and blood tinged" yesterday; he notes he was unable to pass gas or have a BM before then. Per wife, pt was taken off of narcotics this morning and has only had clear liquids all day. He took a Ambulance person prior to arrival with partial relief. He denies nausea, vomiting and fever. He has had chills.  HPI  Past Medical History:  Diagnosis Date  . Headache   . Kidney stones     Patient Active Problem List   Diagnosis Date Noted  . Hyperbilirubinemia 02/04/2016    Past Surgical History:  Procedure Laterality Date  . cyst removed     between his front teeth and cyst removed from abdomal area  . TONSILLECTOMY    . ULNAR NERVE TRANSPOSITION      Home Medications    Prior to Admission medications   Medication Sig Start Date End Date Taking? Authorizing Provider  bisacodyl (BISAC-EVAC) 10 MG suppository Place 10 mg rectally as needed for moderate constipation.   Yes Historical Provider, MD  docusate sodium (COLACE) 100  MG capsule Take 100 mg by mouth 2 (two) times daily as needed for mild constipation.   Yes Historical Provider, MD  ibuprofen (ADVIL,MOTRIN) 200 MG tablet Take 800 mg by mouth every 6 (six) hours as needed (for pain.).   Yes Historical Provider, MD  ondansetron (ZOFRAN) 4 MG tablet Take 1 tablet (4 mg total) by mouth every 6 (six) hours. 01/23/16  Yes Virgel Manifold, MD  oxyCODONE-acetaminophen (PERCOCET/ROXICET) 5-325 MG tablet Take 1-2 tablets by mouth every 4 (four) hours as needed for moderate pain or severe pain. 02/01/16  Yes Coralie Keens, MD  simethicone (MYLICON) 80 MG chewable tablet Chew 80-160 mg by mouth every 6 (six) hours as needed for flatulence.   Yes Historical Provider, MD    Family History History reviewed. No pertinent family history.  Social History Social History  Substance Use Topics  . Smoking status: Never Smoker  . Smokeless tobacco: Never Used  . Alcohol use No     Comment: social at times     Allergies   No known allergies   Review of Systems Review of Systems 10 systems reviewed and all are negative for acute change except as noted in the HPI.  Physical Exam Updated Vital Signs BP (!) 169/104 (BP Location: Right Arm)   Pulse 65   Temp 97.7 F (36.5 C) (Oral)   Resp 18   Ht 5\' 8"  (1.727 m)  Wt 184 lb (83.5 kg)   SpO2 97%   BMI 27.98 kg/m   Physical Exam General: Well-developed, well-nourished male in no acute distress; appearance consistent with age of record HENT: normocephalic; atraumatic Eyes: pupils equal, round and reactive to light; extraocular muscles intact Neck: supple Heart: regular rate and rhythm Lungs: clear to auscultation bilaterally Abdomen: soft; nondistended; RUQ tenderness, multiple Laparoscopy incisions with bruising but no signs of infection; no masses or hepatosplenomegaly; bowel sounds present Extremities: No deformity; full range of motion; pulses normal Neurologic: Awake, alert and oriented; motor function intact  in all extremities and symmetric; no facial droop Skin: Warm and dry Psychiatric: Normal mood and affect  ED Treatments / Results   Nursing notes and vitals signs, including pulse oximetry, reviewed.  Summary of this visit's results, reviewed by myself:  Labs:  Results for orders placed or performed during the hospital encounter of 02/04/16 (from the past 24 hour(s))  Lipase, blood     Status: None   Collection Time: 02/04/16  1:10 AM  Result Value Ref Range   Lipase 21 11 - 51 U/L  CBC with Differential/Platelet     Status: Abnormal   Collection Time: 02/04/16  1:10 AM  Result Value Ref Range   WBC 11.6 (H) 4.0 - 10.5 K/uL   RBC 4.87 4.22 - 5.81 MIL/uL   Hemoglobin 15.5 13.0 - 17.0 g/dL   HCT 44.8 39.0 - 52.0 %   MCV 92.0 78.0 - 100.0 fL   MCH 31.8 26.0 - 34.0 pg   MCHC 34.6 30.0 - 36.0 g/dL   RDW 12.5 11.5 - 15.5 %   Platelets 246 150 - 400 K/uL   Neutrophils Relative % 78 %   Neutro Abs 9.1 (H) 1.7 - 7.7 K/uL   Lymphocytes Relative 9 %   Lymphs Abs 1.0 0.7 - 4.0 K/uL   Monocytes Relative 12 %   Monocytes Absolute 1.4 (H) 0.1 - 1.0 K/uL   Eosinophils Relative 1 %   Eosinophils Absolute 0.1 0.0 - 0.7 K/uL   Basophils Relative 0 %   Basophils Absolute 0.0 0.0 - 0.1 K/uL  Comprehensive metabolic panel     Status: Abnormal   Collection Time: 02/04/16  1:10 AM  Result Value Ref Range   Sodium 137 135 - 145 mmol/L   Potassium 4.4 3.5 - 5.1 mmol/L   Chloride 101 101 - 111 mmol/L   CO2 27 22 - 32 mmol/L   Glucose, Bld 122 (H) 65 - 99 mg/dL   BUN 13 6 - 20 mg/dL   Creatinine, Ser 0.83 0.61 - 1.24 mg/dL   Calcium 8.9 8.9 - 10.3 mg/dL   Total Protein 7.8 6.5 - 8.1 g/dL   Albumin 4.4 3.5 - 5.0 g/dL   AST 157 (H) 15 - 41 U/L   ALT 191 (H) 17 - 63 U/L   Alkaline Phosphatase 102 38 - 126 U/L   Total Bilirubin 3.9 (H) 0.3 - 1.2 mg/dL   GFR calc non Af Amer >60 >60 mL/min   GFR calc Af Amer >60 >60 mL/min   Anion gap 9 5 - 15    Imaging Studies: Ct Abdomen Pelvis W  Contrast  Result Date: 02/04/2016 CLINICAL DATA:  45 year old male status post cholecystectomy on 02/01/2016 presenting with abdominal pain at the surgical site. EXAM: CT ABDOMEN AND PELVIS WITH CONTRAST TECHNIQUE: Multidetector CT imaging of the abdomen and pelvis was performed using the standard protocol following bolus administration of intravenous contrast. CONTRAST:  173mL ISOVUE-300  IOPAMIDOL (ISOVUE-300) INJECTION 61% COMPARISON:  CT dated 01/23/2016 FINDINGS: There are subsegmental atelectatic changes of the lung bases. There is a small pneumoperitoneum, likely postoperative. Small free fluid is noted within the pelvis as well as a small perihepatic free fluid. Layering high attenuation within the pelvic fluid may represent serosanguineous or proteinaceous content. There is postsurgical changes of cholecystectomy with multiple surgical clips at the cholecystectomy bed. There is diffuse stranding of the fat at the operative bed as well as diffuse stranding of the upper mesentery, likely postsurgical. No loculated or walled-off fluid collection identified. No retained stone noted in the CBD. There is no biliary ductal dilatation. The 1.5 cm left hepatic hypodense lesion is not characterized but likely represent a cyst. There is apparent mild fatty infiltration of the liver. The pancreas, spleen, adrenal glands appear unremarkable. There is no hydronephrosis on either side. Subcentimeter right renal hypodense lesion are too small to characterize but likely represent cysts. The visualized ureters, urinary bladder, prostate, and the seminal vesicles are grossly unremarkable. There is no evidence of bowel obstruction or active inflammation. Normal appendix. The abdominal aorta and IVC appear unremarkable. No portal venous gas identified. The SMV, splenic vein, and main portal vein are patent. There is no adenopathy. There is a small fat containing left inguinal hernia. There is a small fat containing umbilical  hernia. There is haziness of the periumbilical subcutaneous fat with multiple small pockets of gas, likely related to laparoscopic port placement. No fluid collection or hematoma. There is a small intramuscular lipoma in the right transverse abdominis muscle. The osseous structures appear unremarkable. IMPRESSION: Postsurgical changes of cholecystectomy with diffuse stranding of the upper abdominal mesentery and fat in the cholecystectomy bed. No drainable fluid collection or abscess identified. Small perihepatic as well as pelvic free fluid, likely postoperative. A biliary leak is less likely but not entirely excluded. A hepatobiliary scintigraphy may provide better evaluation if there is clinical concern for biliary leak. Multiple scattered small pockets of intraperitoneal air, likely post cholecystectomy. Electronically Signed   By: Anner Crete M.D.   On: 02/04/2016 03:25   Procedures    Final Clinical Impressions(s) / ED Diagnoses  Dr. Marcello Moores of General Surgery to admit.  Final diagnoses:  Postoperative pain  Elevated liver function tests   I personally performed the services described in this documentation, which was scribed in my presence. The recorded information has been reviewed and is accurate.    Raymond Rosser, MD 02/04/16 [phone]

## 2016-02-04 NOTE — Anesthesia Preprocedure Evaluation (Addendum)
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  Airway Mallampati: II  TM Distance: >3 FB     Dental  (+) Dental Advisory Given   Pulmonary    breath sounds clear to auscultation       Cardiovascular negative cardio ROS   Rhythm:Regular Rate:Normal     Neuro/Psych  Headaches,    GI/Hepatic negative GI ROS, Neg liver ROS,   Endo/Other  negative endocrine ROS  Renal/GU Renal disease  negative genitourinary   Musculoskeletal   Abdominal   Peds  Hematology   Anesthesia Other Findings   Reproductive/Obstetrics                            Anesthesia Physical  Anesthesia Plan  ASA: II  Anesthesia Plan: General   Post-op Pain Management:    Induction: Intravenous  Airway Management Planned: Oral ETT and Video Laryngoscope Planned  Additional Equipment:   Intra-op Plan:   Post-operative Plan: Extubation in OR  Informed Consent: I have reviewed the patients History and Physical, chart, labs and discussed the procedure including the risks, benefits and alternatives for the proposed anesthesia with the patient or authorized representative who has indicated his/her understanding and acceptance.   Dental advisory given  Plan Discussed with: CRNA and Anesthesiologist  Anesthesia Plan Comments:         Anesthesia Quick Evaluation

## 2016-02-04 NOTE — Interval H&P Note (Signed)
History and Physical Interval Note:  02/04/2016 5:56 PM  Raymond Kelly  has presented today for surgery, with the diagnosis of blockage  The various methods of treatment have been discussed with the patient and family. After consideration of risks, benefits and other options for treatment, the patient has consented to  Procedure(s): ENDOSCOPIC RETROGRADE CHOLANGIOPANCREATOGRAPHY (ERCP) (N/A) as a surgical intervention .  The patient's history has been reviewed, patient examined, no change in status, stable for surgery.  I have reviewed the patient's chart and labs.  Questions were answered to the patient's satisfaction.     [name]

## 2016-02-04 NOTE — Progress Notes (Signed)
HIDA positive for a leak.  Discussed with him and his family.  Will request GI consult for ERCP and stent placement.

## 2016-02-05 LAB — CBC
HEMATOCRIT: 42.1 % (ref 39.0–52.0)
Hemoglobin: 14.3 g/dL (ref 13.0–17.0)
MCH: 31.8 pg (ref 26.0–34.0)
MCHC: 34 g/dL (ref 30.0–36.0)
MCV: 93.6 fL (ref 78.0–100.0)
PLATELETS: 236 10*3/uL (ref 150–400)
RBC: 4.5 MIL/uL (ref 4.22–5.81)
RDW: 12.5 % (ref 11.5–15.5)
WBC: 8.3 10*3/uL (ref 4.0–10.5)

## 2016-02-05 LAB — COMPREHENSIVE METABOLIC PANEL
ALT: 175 U/L — AB (ref 17–63)
AST: 108 U/L — AB (ref 15–41)
Albumin: 3.5 g/dL (ref 3.5–5.0)
Alkaline Phosphatase: 123 U/L (ref 38–126)
Anion gap: 6 (ref 5–15)
BUN: 11 mg/dL (ref 6–20)
CHLORIDE: 105 mmol/L (ref 101–111)
CO2: 29 mmol/L (ref 22–32)
CREATININE: 1.02 mg/dL (ref 0.61–1.24)
Calcium: 8.6 mg/dL — ABNORMAL LOW (ref 8.9–10.3)
Glucose, Bld: 108 mg/dL — ABNORMAL HIGH (ref 65–99)
POTASSIUM: 4.4 mmol/L (ref 3.5–5.1)
SODIUM: 140 mmol/L (ref 135–145)
Total Bilirubin: 2.8 mg/dL — ABNORMAL HIGH (ref 0.3–1.2)
Total Protein: 6.5 g/dL (ref 6.5–8.1)

## 2016-02-05 NOTE — Op Note (Signed)
[facility] Patient Name: Raymond Kelly Procedure Date: 02/04/2016 MRN: [id number] Attending MD: Estill Cotta. Loletha Carrow , MD Date of Birth: 1970-07-09 CSN: [id number] Age: 45 Admit Type: Inpatient Procedure:                ERCP Indications:              Bile leak Providers:                Mallie Mussel L. Loletha Carrow, MD, Hilma Favors, RN, Ralene Bathe,                            Technician Referring MD:              Medicines:                General Anesthesia, Indomethacin 123XX123 mg PR Complications:            No immediate complications. Estimated Blood Loss:     Estimated blood loss: none. Procedure:                Pre-Anesthesia Assessment:                           - Prior to the procedure, a History and Physical                            was performed, and patient medications and                            allergies were reviewed. The patient's tolerance of                            previous anesthesia was also reviewed. The risks                            and benefits of the procedure and the sedation                            options and risks were discussed with the patient.                            All questions were answered, and informed consent                            was obtained. Prior Anticoagulants: The patient has                            taken no previous anticoagulant or antiplatelet                            agents. ASA Grade Assessment: II - A patient with                            mild systemic disease. After reviewing the risks  and benefits, the patient was deemed in                            satisfactory condition to undergo the procedure.                           After obtaining informed consent, the scope was                            passed under direct vision. Throughout the                            procedure, the patient's blood pressure, pulse, and                            oxygen saturations were monitored  continuously. The                            [id number]) scope was introduced through                            the mouth, and used to inject contrast into and                            used to inject contrast into the bile duct. The                            ERCP was accomplished without difficulty. The                            patient tolerated the procedure well. Scope In: Scope Out: Findings:      A scout film of the abdomen was obtained. Surgical clips, consistent       with a previous cholecystectomy, were seen in the area of the right       upper quadrant of the abdomen. The esophagus was successfully intubated       under direct vision. The scope was advanced to a normal major papilla in       the descending duodenum without detailed examination of the pharynx,       larynx and associated structures, and upper GI tract. The upper GI tract       was grossly normal. The bile duct was deeply cannulated with the       traction (standard) sphincterotome. Contrast was injected. I personally       interpreted the bile duct images. There was brisk flow of contrast       through the ducts. Image quality was adequate. Contrast extended to the       hepatic ducts. The in the biliary system was not dilated. There was       contrast extravasation from the cystic duct stump. A 0.035 inch x 260 cm       straight Hydra Jagwire passed successfully into the right intrahepatic       branches. One 10 Fr by 7 cm plastic stent with a single external flap       and a single internal flap was  placed 6 cm into the common bile duct       (proximal end at the bifurcation). Bile flowed through the end and side       port of the stent. The stent was in good position. Impression:               - Bile leak                           One plastic stent was placed into the common bile                            duct. Moderate Sedation:      GETA Recommendation:           - follow clinical course                            if bile leak resolves, remove biliary stent with                            EGD in about 4 weeks Procedure Code(s):        --- Professional ---                           [phone], Endoscopic retrograde                            cholangiopancreatography (ERCP); with placement of                            endoscopic stent into biliary or pancreatic duct,                            including pre- and post-dilation and guide wire                            passage, when performed, including sphincterotomy,                            when performed, each stent Diagnosis Code(s):        --- Professional ---                           K83.8, Other specified diseases of biliary tract CPT copyright 2016 [employer]. All rights reserved. The codes documented in this report are preliminary and upon coder review may  be revised to meet current compliance requirements. Henry L. Loletha Carrow, MD 02/05/2016 12:22:32 PM This report has been signed electronically. Number of Addenda: 0

## 2016-02-05 NOTE — Progress Notes (Signed)
1 Day Post-Op  Subjective: Pain is much better he has no tenderness this a.m. He also reports he is hungry.  Objective: Vital signs in last 24 hours: Temp:  [98.1 F (36.7 C)-99.2 F (37.3 C)] 98.8 F (37.1 C) (08/01 0525) Pulse Rate:  [58-76] 71 (08/01 0525) Resp:  [14-17] 16 (08/01 0525) BP: (121-144)/(64-85) 137/69 (08/01 0525) SpO2:  [96 %-100 %] 96 % (08/01 0525) Last BM Date: 03/02/16 Nothing by mouth 1050 urine output Afebrile vital signs are stable Labs show a normal white count CMP shows bilirubin, AST and ALT declining  Intake/Output from previous day: 07/31 0701 - 08/01 0700 In: 3113.3 [I.V.:3113.3] Out: 1050 [Urine:1050] Intake/Output this shift: Total I/O In: -  Out: 300 [Urine:300]  General appearance: alert, cooperative and no distress GI: soft, non-tender; bowel sounds normal; no masses,  no organomegaly  Lab Results:   Recent Labs  02/04/16 0110 02/05/16 0446  WBC 11.6* 8.3  HGB 15.5 14.3  HCT 44.8 42.1  PLT 246 236    BMET  Recent Labs  02/04/16 0110 02/05/16 0446  NA 137 140  K 4.4 4.4  CL 101 105  CO2 27 29  GLUCOSE 122* 108*  BUN 13 11  CREATININE 0.83 1.02  CALCIUM 8.9 8.6*   PT/INR No results for input(s): LABPROT, INR in the last 72 hours.   Recent Labs Lab 02/04/16 0110 02/05/16 0446  AST 157* 108*  ALT 191* 175*  ALKPHOS 102 123  BILITOT 3.9* 2.8*  PROT 7.8 6.5  ALBUMIN 4.4 3.5     Lipase     Component Value Date/Time   LIPASE 21 02/04/2016 0110     Studies/Results: Dg Abd 1 View  Result Date: 02/04/2016 CLINICAL DATA:  Bile leak, stent placement EXAM: ABDOMEN - 1 VIEW; DG C-ARM 1-60 MIN-NO REPORT COMPARISON:  CT abdomen 02/04/2016 FLUOROSCOPY TIME:  3 minutes 53 seconds Exposure:  147.24 mGy FINDINGS: Multiple images submitted. Surgical clips RIGHT upper quadrant from cholecystectomy. Extravasated contrast material is seen at the gallbladder fossa compatible with bile leak. It is uncertain whether the  extravasated contrast is arising from the cystic duct stump or a duct of Luschka. No definite filling defects identified within the opacified biliary tree. No biliary dilatation identified. CBD stent placed. Good drainage of contrast from the intrahepatic biliary tree following stent placement. IMPRESSION: Postoperative bile leak, for which a CBD stent was placed. Electronically Signed   By: Lavonia Dana M.D.   On: 02/04/2016 21:03   Nm Hepatobiliary Including Gb  Result Date: 02/04/2016 CLINICAL DATA:  Evaluate for bile leak. Recent laparoscopic cholecystectomy. EXAM: NUCLEAR MEDICINE HEPATOBILIARY IMAGING TECHNIQUE: Sequential images of the abdomen were obtained out to 60 minutes following intravenous administration of radiopharmaceutical. RADIOPHARMACEUTICALS:  5.1 mCi Tc-36m  Choletec IV COMPARISON:  CT scan abdomen/pelvis, same date. FINDINGS: There is symmetric uptake in the liver and an immediate collection of radiopharmaceutical in the right upper quadrant. This continues across the midline under the left hemidiaphragm and down the left paracolic gutter and into the pelvis. Findings are consistent with a bile leak. This would also correlate with the CT scan which shows fluid in the left paracolic gutter and moderate free fluid in the pelvis. IMPRESSION: Findings consistent with a bile leak. Electronically Signed   By: Marijo Sanes M.D.   On: 02/04/2016 13:00   Ct Abdomen Pelvis W Contrast  Result Date: 02/04/2016 CLINICAL DATA:  45 year old male status post cholecystectomy on 02/01/2016 presenting with abdominal pain at the surgical  site. EXAM: CT ABDOMEN AND PELVIS WITH CONTRAST TECHNIQUE: Multidetector CT imaging of the abdomen and pelvis was performed using the standard protocol following bolus administration of intravenous contrast. CONTRAST:  175mL ISOVUE-300 IOPAMIDOL (ISOVUE-300) INJECTION 61% COMPARISON:  CT dated 01/23/2016 FINDINGS: There are subsegmental atelectatic changes of the lung  bases. There is a small pneumoperitoneum, likely postoperative. Small free fluid is noted within the pelvis as well as a small perihepatic free fluid. Layering high attenuation within the pelvic fluid may represent serosanguineous or proteinaceous content. There is postsurgical changes of cholecystectomy with multiple surgical clips at the cholecystectomy bed. There is diffuse stranding of the fat at the operative bed as well as diffuse stranding of the upper mesentery, likely postsurgical. No loculated or walled-off fluid collection identified. No retained stone noted in the CBD. There is no biliary ductal dilatation. The 1.5 cm left hepatic hypodense lesion is not characterized but likely represent a cyst. There is apparent mild fatty infiltration of the liver. The pancreas, spleen, adrenal glands appear unremarkable. There is no hydronephrosis on either side. Subcentimeter right renal hypodense lesion are too small to characterize but likely represent cysts. The visualized ureters, urinary bladder, prostate, and the seminal vesicles are grossly unremarkable. There is no evidence of bowel obstruction or active inflammation. Normal appendix. The abdominal aorta and IVC appear unremarkable. No portal venous gas identified. The SMV, splenic vein, and main portal vein are patent. There is no adenopathy. There is a small fat containing left inguinal hernia. There is a small fat containing umbilical hernia. There is haziness of the periumbilical subcutaneous fat with multiple small pockets of gas, likely related to laparoscopic port placement. No fluid collection or hematoma. There is a small intramuscular lipoma in the right transverse abdominis muscle. The osseous structures appear unremarkable. IMPRESSION: Postsurgical changes of cholecystectomy with diffuse stranding of the upper abdominal mesentery and fat in the cholecystectomy bed. No drainable fluid collection or abscess identified. Small perihepatic as well as  pelvic free fluid, likely postoperative. A biliary leak is less likely but not entirely excluded. A hepatobiliary scintigraphy may provide better evaluation if there is clinical concern for biliary leak. Multiple scattered small pockets of intraperitoneal air, likely post cholecystectomy. Electronically Signed   By: Anner Crete M.D.   On: 02/04/2016 03:25  Dg C-arm 1-60 Min-no Report  Result Date: 02/04/2016 CLINICAL DATA: ERCP C-ARM 1-60 MINUTES Fluoroscopy was utilized by the requesting physician.  No radiographic interpretation.   Prior to Admission medications   Medication Sig Start Date End Date Taking? Authorizing Provider  bisacodyl (BISAC-EVAC) 10 MG suppository Place 10 mg rectally as needed for moderate constipation.   Yes Historical Provider, MD  docusate sodium (COLACE) 100 MG capsule Take 100 mg by mouth 2 (two) times daily as needed for mild constipation.   Yes Historical Provider, MD  ibuprofen (ADVIL,MOTRIN) 200 MG tablet Take 800 mg by mouth every 6 (six) hours as needed (for pain.).   Yes Historical Provider, MD  ondansetron (ZOFRAN) 4 MG tablet Take 1 tablet (4 mg total) by mouth every 6 (six) hours. 01/23/16  Yes Virgel Manifold, MD  oxyCODONE-acetaminophen (PERCOCET/ROXICET) 5-325 MG tablet Take 1-2 tablets by mouth every 4 (four) hours as needed for moderate pain or severe pain. 02/01/16  Yes Coralie Keens, MD  simethicone (MYLICON) 80 MG chewable tablet Chew 80-160 mg by mouth every 6 (six) hours as needed for flatulence.   Yes Historical Provider, MD    Medications: . enoxaparin (LOVENOX) injection  40 mg  Subcutaneous Q24H  . piperacillin-tazobactam (ZOSYN)  IV  3.375 g Intravenous Q8H    Assessment/Plan Symptomatic cholelithiasis status post laparoscopic cholecystectomy 02/01/16 Dr. Ninfa [name] POD 4 Postop bile leak Status post ERCP and plastic biliary stent placement 02/04/16 Dr. Wilfrid Lund POD 1 FEN:  Nothing by mouth - start him on full liquids now, if no issues  advance diet later today ID: Day 2 Zosyn DVT:  Lovenox/SCD    Plan: Start full liquids, recheck his labs the a.m., monitor for pain. Begin ambulation in the halls. Discuss antibiotic duration.    LOS: 1 day    Denaja Verhoeven 02/05/2016 [phone]

## 2016-02-05 NOTE — Progress Notes (Signed)
Patient ID: CYRILLE SHAMS, male   DOB: 03-31-1971, 45 y.o.   MRN: [id number]    Progress Note   Subjective   Feels a lot better- can move around comfortably, shoulder pain gone, diet being advanced   Objective   Vital signs in last 24 hours: Temp:  [98.1 F (36.7 C)-99.2 F (37.3 C)] 98.8 F (37.1 C) (08/01 0525) Pulse Rate:  [58-76] 71 (08/01 0525) Resp:  [14-17] 16 (08/01 0525) BP: (121-144)/(64-85) 137/69 (08/01 0525) SpO2:  [96 %-100 %] 96 % (08/01 0525) Last BM Date: 03/02/16 General:    White male in NAD Heart:  Regular rate and rhythm; no murmurs Lungs: Respirations even and unlabored, lungs CTA bilaterally Abdomen:  Soft, tender and nondistended. Normal bowel sounds. Extremities:  Without edema. Neurologic:  Alert and oriented,  grossly normal neurologically. Psych:  Cooperative. Normal mood and affect.  Intake/Output from previous day: 07/31 0701 - 08/01 0700 In: 3113.3 [I.V.:3113.3] Out: 1050 [Urine:1050] Intake/Output this shift: Total I/O In: -  Out: 300 [Urine:300]  Lab Results:  Recent Labs  02/04/16 0110 02/05/16 0446  WBC 11.6* 8.3  HGB 15.5 14.3  HCT 44.8 42.1  PLT 246 236   BMET  Recent Labs  02/04/16 0110 02/05/16 0446  NA 137 140  K 4.4 4.4  CL 101 105  CO2 27 29  GLUCOSE 122* 108*  BUN 13 11  CREATININE 0.83 1.02  CALCIUM 8.9 8.6*   LFT  Recent Labs  02/05/16 0446  PROT 6.5  ALBUMIN 3.5  AST 108*  ALT 175*  ALKPHOS 123  BILITOT 2.8*   PT/INR No results for input(s): LABPROT, INR in the last 72 hours.  Studies/Results: Dg Abd 1 View  Result Date: 02/04/2016 CLINICAL DATA:  Bile leak, stent placement EXAM: ABDOMEN - 1 VIEW; DG C-ARM 1-60 MIN-NO REPORT COMPARISON:  CT abdomen 02/04/2016 FLUOROSCOPY TIME:  3 minutes 53 seconds Exposure:  147.24 mGy FINDINGS: Multiple images submitted. Surgical clips RIGHT upper quadrant from cholecystectomy. Extravasated contrast material is seen at the gallbladder fossa compatible with  bile leak. It is uncertain whether the extravasated contrast is arising from the cystic duct stump or a duct of Luschka. No definite filling defects identified within the opacified biliary tree. No biliary dilatation identified. CBD stent placed. Good drainage of contrast from the intrahepatic biliary tree following stent placement. IMPRESSION: Postoperative bile leak, for which a CBD stent was placed. Electronically Signed   By: Lavonia Dana M.D.   On: 02/04/2016 21:03   Nm Hepatobiliary Including Gb  Result Date: 02/04/2016 CLINICAL DATA:  Evaluate for bile leak. Recent laparoscopic cholecystectomy. EXAM: NUCLEAR MEDICINE HEPATOBILIARY IMAGING TECHNIQUE: Sequential images of the abdomen were obtained out to 60 minutes following intravenous administration of radiopharmaceutical. RADIOPHARMACEUTICALS:  5.1 mCi Tc-62m  Choletec IV COMPARISON:  CT scan abdomen/pelvis, same date. FINDINGS: There is symmetric uptake in the liver and an immediate collection of radiopharmaceutical in the right upper quadrant. This continues across the midline under the left hemidiaphragm and down the left paracolic gutter and into the pelvis. Findings are consistent with a bile leak. This would also correlate with the CT scan which shows fluid in the left paracolic gutter and moderate free fluid in the pelvis. IMPRESSION: Findings consistent with a bile leak. Electronically Signed   By: Marijo Sanes M.D.   On: 02/04/2016 13:00   Ct Abdomen Pelvis W Contrast  Result Date: 02/04/2016 CLINICAL DATA:  45 year old male status post cholecystectomy on 02/01/2016 presenting with abdominal pain  at the surgical site. EXAM: CT ABDOMEN AND PELVIS WITH CONTRAST TECHNIQUE: Multidetector CT imaging of the abdomen and pelvis was performed using the standard protocol following bolus administration of intravenous contrast. CONTRAST:  179mL ISOVUE-300 IOPAMIDOL (ISOVUE-300) INJECTION 61% COMPARISON:  CT dated 01/23/2016 FINDINGS: There are  subsegmental atelectatic changes of the lung bases. There is a small pneumoperitoneum, likely postoperative. Small free fluid is noted within the pelvis as well as a small perihepatic free fluid. Layering high attenuation within the pelvic fluid may represent serosanguineous or proteinaceous content. There is postsurgical changes of cholecystectomy with multiple surgical clips at the cholecystectomy bed. There is diffuse stranding of the fat at the operative bed as well as diffuse stranding of the upper mesentery, likely postsurgical. No loculated or walled-off fluid collection identified. No retained stone noted in the CBD. There is no biliary ductal dilatation. The 1.5 cm left hepatic hypodense lesion is not characterized but likely represent a cyst. There is apparent mild fatty infiltration of the liver. The pancreas, spleen, adrenal glands appear unremarkable. There is no hydronephrosis on either side. Subcentimeter right renal hypodense lesion are too small to characterize but likely represent cysts. The visualized ureters, urinary bladder, prostate, and the seminal vesicles are grossly unremarkable. There is no evidence of bowel obstruction or active inflammation. Normal appendix. The abdominal aorta and IVC appear unremarkable. No portal venous gas identified. The SMV, splenic vein, and main portal vein are patent. There is no adenopathy. There is a small fat containing left inguinal hernia. There is a small fat containing umbilical hernia. There is haziness of the periumbilical subcutaneous fat with multiple small pockets of gas, likely related to laparoscopic port placement. No fluid collection or hematoma. There is a small intramuscular lipoma in the right transverse abdominis muscle. The osseous structures appear unremarkable. IMPRESSION: Postsurgical changes of cholecystectomy with diffuse stranding of the upper abdominal mesentery and fat in the cholecystectomy bed. No drainable fluid collection or  abscess identified. Small perihepatic as well as pelvic free fluid, likely postoperative. A biliary leak is less likely but not entirely excluded. A hepatobiliary scintigraphy may provide better evaluation if there is clinical concern for biliary leak. Multiple scattered small pockets of intraperitoneal air, likely post cholecystectomy. Electronically Signed   By: Anner Crete M.D.   On: 02/04/2016 03:25  Dg C-arm 1-60 Min-no Report  Result Date: 02/04/2016 CLINICAL DATA: ERCP C-ARM 1-60 MINUTES Fluoroscopy was utilized by the requesting physician.  No radiographic interpretation.       Assessment / Plan:    #1 45 yo male s/p ERCP and Stent placement yesterday for bile leak Stable, feels much better #2 s/p lap chole 7/19 /2017  Gi will sign off, please call for questions  Appt  Made for office follow up with Dr Loletha Carrow  On 8/31 /2017 at 11 :30 am, anticipate stent removal in about 6 weeks  Active Problems:   Hyperbilirubinemia   Bile leak, postoperative     LOS: 1 day   Trequan Marsolek  02/05/2016, 11:11 AM

## 2016-02-06 ENCOUNTER — Encounter: Payer: Self-pay | Admitting: General Surgery

## 2016-02-06 MED ORDER — AMOXICILLIN-POT CLAVULANATE 875-125 MG PO TABS
1.0000 | ORAL_TABLET | Freq: Two times a day (BID) | ORAL | Status: DC
  Administered 2016-02-06: 1 via ORAL
  Filled 2016-02-06: qty 1

## 2016-02-06 MED ORDER — OXYCODONE-ACETAMINOPHEN 5-325 MG PO TABS: 1.0000 | ORAL_TABLET | ORAL | 0 refills | Status: DC | PRN

## 2016-02-06 MED ORDER — ACETAMINOPHEN 325 MG PO TABS
ORAL_TABLET | ORAL | Status: DC
Start: 2016-02-06 — End: 2019-09-07

## 2016-02-06 MED ORDER — AMOXICILLIN-POT CLAVULANATE 875-125 MG PO TABS: 1.0000 | ORAL_TABLET | Freq: Two times a day (BID) | ORAL | 0 refills | Status: DC

## 2016-02-06 MED ORDER — IBUPROFEN 200 MG PO TABS: ORAL_TABLET | ORAL | 0 refills | Status: DC

## 2016-02-06 NOTE — Discharge Instructions (Signed)
Endoscopic Retrograde Cholangiopancreatography (ERCP), Care After Refer to this sheet in the next few weeks. These instructions provide you with information on caring for yourself after your procedure. Your health care provider may also give you more specific instructions. Your treatment has been planned according to current medical practices, but problems sometimes occur. Call your health care provider if you have any problems or questions after your procedure.  WHAT TO EXPECT AFTER THE PROCEDURE  After your procedure, it is typical to feel:   Soreness in your throat.   Sick to your stomach (nauseous).   Bloated.  Dizzy.   Fatigued. HOME CARE INSTRUCTIONS  Have a friend or family member stay with you for the first 24 hours after your procedure.  Start taking your usual medicines and eating normally as soon as you feel well enough to do so or as directed by your health care provider. SEEK MEDICAL CARE IF:  You have abdominal pain.   You develop signs of infection, such as:   Chills.   Feeling unwell.  SEEK IMMEDIATE MEDICAL CARE IF:  You have difficulty swallowing.  You have worsening throat, chest, or abdominal pain.  You vomit.  You have bloody or very black stools.  You have a fever.   This information is not intended to replace advice given to you by your health care provider. Make sure you discuss any questions you have with your health care provider.   Document Released: 04/13/2013 Document Reviewed: 04/13/2013 Elsevier Interactive Patient Education 2016 [place] ______[facility] [facility] LAPAROSCOPIC SURGERY: POST OP INSTRUCTIONS Always review your discharge instruction sheet given to you by the facility where your surgery was performed. IF YOU HAVE DISABILITY OR FAMILY LEAVE FORMS, YOU MUST BRING THEM TO THE OFFICE FOR PROCESSING.   DO NOT GIVE THEM TO YOUR DOCTOR.  1. A prescription for pain medication may be given to you upon  discharge.  Take your pain medication as prescribed, if needed.  If narcotic pain medicine is not needed, then you may take acetaminophen (Tylenol) or ibuprofen (Advil) as needed. 2. Take your usually prescribed medications unless otherwise directed. 3. If you need a refill on your pain medication, please contact your pharmacy.  They will contact our office to request authorization. Prescriptions will not be filled after 5pm or on week-ends. 4. You should follow a light diet the first few days after arrival home, such as soup and crackers, etc.  Be sure to include lots of fluids daily. 5. Most patients will experience some swelling and bruising in the area of the incisions.  Ice packs will help.  Swelling and bruising can take several days to resolve.  6. It is common to experience some constipation if taking pain medication after surgery.  Increasing fluid intake and taking a stool softener (such as Colace) will usually help or prevent this problem from occurring.  A mild laxative (Milk of Magnesia or Miralax) should be taken according to package instructions if there are no bowel movements after 48 hours. 7. Unless discharge instructions indicate otherwise, you may remove your bandages 24-48 hours after surgery, and you may shower at that time.  You may have steri-strips (small skin tapes) in place directly over the incision.  These strips should be left on the skin for 7-10 days.  If your surgeon used skin glue on the incision, you may shower in 24 hours.  The glue will flake off over the next 2-3 weeks.  Any sutures or staples will  be removed at the office during your follow-up visit. 8. ACTIVITIES:  You may resume regular (light) daily activities beginning the next day--such as daily self-care, walking, climbing stairs--gradually increasing activities as tolerated.  You may have sexual intercourse when it is comfortable.  Refrain from any heavy lifting or straining until approved by your doctor. a. You  may drive when you are no longer taking prescription pain medication, you can comfortably wear a seatbelt, and you can safely maneuver your car and apply brakes. b. RETURN TO WORK:  __________________________________________________________ 9. You should see your doctor in the office for a follow-up appointment approximately 2-3 weeks after your surgery.  Make sure that you call for this appointment within a day or two after you arrive home to insure a convenient appointment time. 10. OTHER INSTRUCTIONS: __________________________________________________________________________________________________________________________ __________________________________________________________________________________________________________________________ WHEN TO CALL YOUR DOCTOR: 1. Fever over 101.0 2. Inability to urinate 3. Continued bleeding from incision. 4. Increased pain, redness, or drainage from the incision. 5. Increasing abdominal pain  The clinic staff is available to answer your questions during regular business hours.  Please dont hesitate to call and ask to speak to one of the nurses for clinical concerns.  If you have a medical emergency, go to the nearest emergency room or call 911.  A surgeon from [facility] is always on call at the hospital. [address] [place], [place], [place]  [address] ? [address], [place], [place]   [id number] [phone] ? [phone] ? FAX [phone] Web site: [email or web address]

## 2016-02-06 NOTE — Progress Notes (Signed)
2 Days Post-Op  Subjective: He looks great this a.m. feels much better. He points to one area top trocar site that is uncomfortable mostly after he eats.  It hurts for short time and then gets better. He is tolerating full liquids well. He would like to go home now.   Objective: Vital signs in last 24 hours: Temp:  [98.4 F (36.9 C)-99.2 F (37.3 C)] 98.4 F (36.9 C) (08/02 0529) Pulse Rate:  [55-65] 55 (08/02 0529) Resp:  [15-16] 16 (08/02 0529) BP: (113-138)/(77-85) 113/77 (08/02 0529) SpO2:  [96 %-98 %] 98 % (08/02 0529) Last BM Date: 01/31/16 635 by mouth 2443 IV Urine 300 recorded Afebrile vital signs are stable. No labs Intake/Output from previous day: 08/01 0701 - 08/02 0700 In: 3128.3 [P.O.:635; I.V.:2443.3; IV Piggyback:50] Out: 300 [Urine:300] Intake/Output this shift: No intake/output data recorded.  General appearance: alert, cooperative and no distress Resp: clear to auscultation bilaterally GI: Soft, pain is markedly improved. Minimal discomfort. Tolerating full liquids, trocar sites all look good.  Lab Results:   Recent Labs  02/04/16 0110 02/05/16 0446  WBC 11.6* 8.3  HGB 15.5 14.3  HCT 44.8 42.1  PLT 246 236    BMET  Recent Labs  02/04/16 0110 02/05/16 0446  NA 137 140  K 4.4 4.4  CL 101 105  CO2 27 29  GLUCOSE 122* 108*  BUN 13 11  CREATININE 0.83 1.02  CALCIUM 8.9 8.6*   PT/INR No results for input(s): LABPROT, INR in the last 72 hours.   Recent Labs Lab 02/04/16 0110 02/05/16 0446  AST 157* 108*  ALT 191* 175*  ALKPHOS 102 123  BILITOT 3.9* 2.8*  PROT 7.8 6.5  ALBUMIN 4.4 3.5     Lipase     Component Value Date/Time   LIPASE 21 02/04/2016 0110     Studies/Results: Dg Abd 1 View  Result Date: 02/04/2016 CLINICAL DATA:  Bile leak, stent placement EXAM: ABDOMEN - 1 VIEW; DG C-ARM 1-60 MIN-NO REPORT COMPARISON:  CT abdomen 02/04/2016 FLUOROSCOPY TIME:  3 minutes 53 seconds Exposure:  147.24 mGy FINDINGS: Multiple  images submitted. Surgical clips RIGHT upper quadrant from cholecystectomy. Extravasated contrast material is seen at the gallbladder fossa compatible with bile leak. It is uncertain whether the extravasated contrast is arising from the cystic duct stump or a duct of Luschka. No definite filling defects identified within the opacified biliary tree. No biliary dilatation identified. CBD stent placed. Good drainage of contrast from the intrahepatic biliary tree following stent placement. IMPRESSION: Postoperative bile leak, for which a CBD stent was placed. Electronically Signed   By: Lavonia Dana M.D.   On: 02/04/2016 21:03   Nm Hepatobiliary Including Gb  Result Date: 02/04/2016 CLINICAL DATA:  Evaluate for bile leak. Recent laparoscopic cholecystectomy. EXAM: NUCLEAR MEDICINE HEPATOBILIARY IMAGING TECHNIQUE: Sequential images of the abdomen were obtained out to 60 minutes following intravenous administration of radiopharmaceutical. RADIOPHARMACEUTICALS:  5.1 mCi Tc-45m  Choletec IV COMPARISON:  CT scan abdomen/pelvis, same date. FINDINGS: There is symmetric uptake in the liver and an immediate collection of radiopharmaceutical in the right upper quadrant. This continues across the midline under the left hemidiaphragm and down the left paracolic gutter and into the pelvis. Findings are consistent with a bile leak. This would also correlate with the CT scan which shows fluid in the left paracolic gutter and moderate free fluid in the pelvis. IMPRESSION: Findings consistent with a bile leak. Electronically Signed   By: Marijo Sanes M.D.   On:  02/04/2016 13:00   Dg C-arm 1-60 Min-no Report  Result Date: 02/04/2016 CLINICAL DATA:  Bile leak, stent placement EXAM: ABDOMEN - 1 VIEW; DG C-ARM 1-60 MIN-NO REPORT COMPARISON:  CT abdomen 02/04/2016 FLUOROSCOPY TIME:  3 minutes 53 seconds Exposure:  147.24 mGy FINDINGS: Multiple images submitted. Surgical clips RIGHT upper quadrant from cholecystectomy. Extravasated  contrast material is seen at the gallbladder fossa compatible with bile leak. It is uncertain whether the extravasated contrast is arising from the cystic duct stump or a duct of Luschka. No definite filling defects identified within the opacified biliary tree. No biliary dilatation identified. CBD stent placed. Good drainage of contrast from the intrahepatic biliary tree following stent placement. IMPRESSION: Postoperative bile leak, for which a CBD stent was placed. Electronically Signed   By: Lavonia Dana M.D.   On: 02/04/2016 21:03    Medications: . enoxaparin (LOVENOX) injection  40 mg Subcutaneous Q24H  . piperacillin-tazobactam (ZOSYN)  IV  3.375 g Intravenous Q8H    Assessment/Plan Symptomatic cholelithiasis status post laparoscopic cholecystectomy 02/01/16 Dr. Ninfa [name] POD 4 Postop bile leak Status post ERCP and plastic biliary stent placement 02/04/16 Dr. Wilfrid Lund POD 1 FEN: starting Soft diet ID: Day 2 Zosyn convert to PO Augmentin DVT:  Lovenox/SCD   Plan: We'll convert him to oral Augmentin and a soft diet. Continues to do well anticipated discharge later today with a total of 7 days of antibiotics.    LOS: 2 days    Aireana Ryland 02/06/2016 [phone]

## 2016-02-06 NOTE — Anesthesia Postprocedure Evaluation (Signed)
Anesthesia Post Note  Patient: Raymond Kelly  Procedure(s) Performed: Procedure(s) (LRB): LAPAROSCOPIC CHOLECYSTECTOMY WITH INTRAOPERATIVE CHOLANGIOGRAM (N/A)  Patient location during evaluation: PACU Anesthesia Type: General Level of consciousness: awake and alert Pain management: pain level controlled Vital Signs Assessment: post-procedure vital signs reviewed and stable Respiratory status: spontaneous breathing, nonlabored ventilation, respiratory function stable and patient connected to nasal cannula oxygen Cardiovascular status: blood pressure returned to baseline and stable Postop Assessment: no signs of nausea or vomiting Anesthetic complications: no    Last Vitals:  Vitals:   02/01/16 1545 02/01/16 1600  BP: (!) 148/96   Pulse: (!) 43 (!) 51  Resp: (!) 23 (!) 22  Temp:  36.9 C    Last Pain:  Vitals:   02/01/16 1530  PainSc: Asleep                 Raymond Kelly,Raymond Kelly

## 2016-02-11 NOTE — Discharge Summary (Signed)
Physician Discharge Summary  Patient ID: Raymond Kelly MRN: [id number] DOB/AGE: 08/29/1970 45 y.o.  Admit date: 02/04/2016 Discharge date: 02/06/2016  Admission Diagnoses:  Symptomatic cholelithiasis status post laparoscopic cholecystectomy 02/01/16 Dr. Ninfa [name] Postop bile leak  Discharge Diagnoses:  Same   Active Problems:   Hyperbilirubinemia   Bile leak, postoperative   PROCEDURES: Status post ERCP and plastic biliary stent placement 02/04/16 Dr. Leslye Peer Course:  45 y.o. M s/p lap chole on 7/28 by Dr Ninfa [name].  Cholangiogram was performed, which showed no stones but contrast did not pass into duodenum.  He states that he has had vague "gas pains" since surgery and has been treating himself for constipation.  Last night he developed severe pain in his R flank that lasted for about 1 hr.  He came to the ED to be evaluated.  Pt as seen in the ED and admitted by Dr. Marcello Moores.  HIDA scan was ordered and was positive for a leak.  GI was contacted and and he was sent for ERCP,  a stent was placed at that time.  Post procedure his pain was much better.  We restarted his diet.  Follow up for stent removal was arranged, and by 02/06/16 he was ready for discharge.  We sent him home on 5 more days of Augmentin -total 7 days-  and a soft diet.  Condition on d/c:  Improved  CBC Latest Ref Rng & Units 02/05/2016 02/04/2016 01/23/2016  WBC 4.0 - 10.5 K/uL 8.3 11.6(H) 8.9  Hemoglobin 13.0 - 17.0 g/dL 14.3 15.5 15.3  Hematocrit 39.0 - 52.0 % 42.1 44.8 43.8  Platelets 150 - 400 K/uL 236 246 227      CMP Latest Ref Rng & Units 02/05/2016 02/04/2016 01/23/2016  Glucose 65 - 99 mg/dL 108(H) 122(H) 149(H)  BUN 6 - 20 mg/dL 11 13 18   Creatinine 0.61 - 1.24 mg/dL 1.02 0.83 1.52(H)  Sodium 135 - 145 mmol/L 140 137 140  Potassium 3.5 - 5.1 mmol/L 4.4 4.4 4.0  Chloride 101 - 111 mmol/L 105 101 107  CO2 22 - 32 mmol/L 29 27 26   Calcium 8.9 - 10.3 mg/dL 8.6(L) 8.9 8.9  Total Protein 6.5 - 8.1 g/dL 6.5  7.8 7.5  Total Bilirubin 0.3 - 1.2 mg/dL 2.8(H) 3.9(H) 1.3(H)  Alkaline Phos 38 - 126 U/L 123 102 81  AST 15 - 41 U/L 108(H) 157(H) 35  ALT 17 - 63 U/L 175(H) 191(H) 52       Disposition: 01-Home or Self Care     Medication List    STOP taking these medications   BISAC-EVAC 10 MG suppository Generic drug:  bisacodyl     TAKE these medications   acetaminophen 325 MG tablet Commonly known as:  TYLENOL You can take 2 tablets every 4 hours as needed for pain. This is in your prescribed pain medicine. Do not take more than 4000 mg of Tylenol/acetaminophen per day.   amoxicillin-clavulanate 875-125 MG tablet Commonly known as:  AUGMENTIN Take 1 tablet by mouth every 12 (twelve) hours.   docusate sodium 100 MG capsule Commonly known as:  COLACE Take 100 mg by mouth 2 (two) times daily as needed for mild constipation.   ibuprofen 200 MG tablet Commonly known as:  ADVIL,MOTRIN You can take 2-3 tablets every 6 hours as needed for pain. What changed:  how much to take  how to take this  when to take this  reasons to take this  additional instructions  ondansetron 4 MG tablet Commonly known as:  ZOFRAN Take 1 tablet (4 mg total) by mouth every 6 (six) hours.   oxyCODONE-acetaminophen 5-325 MG tablet Commonly known as:  PERCOCET/ROXICET Take 1-2 tablets by mouth every 4 (four) hours as needed for moderate pain or severe pain.   simethicone 80 MG chewable tablet Commonly known as:  MYLICON Chew 99991111 mg by mouth every 6 (six) hours as needed for flatulence.      Follow-up Information    Eulas Post, MD .   Specialty:  Family Medicine Why:  call and follow up for Medical issues. Contact information: [place] [address] [phone]        Harl Bowie, MD Follow up on 02/21/2016.   Specialty:  General Surgery Why:  Your appointment is at 3:40 8 PM. The the office 30 minutes early for check-in. Call if you have any  issues. Contact information: [address] [place] [address] [phone]        [name], MD Follow up on 03/06/2016.   Specialty:  Gastroenterology Why:  You have an appointment with Dr. Loletha Carrow for follow-up of your stent. Please call and confirm appointment time when he get home. Contact information: [address] [place] [address] [phone]           Signed: Earnstine Regal 02/11/2016, 1:49 PM

## 2016-02-18 ENCOUNTER — Telehealth: Payer: Self-pay

## 2016-02-18 ENCOUNTER — Other Ambulatory Visit: Payer: Self-pay

## 2016-02-18 DIAGNOSIS — K838 Other specified diseases of biliary tract: Secondary | ICD-10-CM

## 2016-02-18 NOTE — Telephone Encounter (Signed)
Doran Stabler, MD  Elias Else, CMA        Vivien Rota,   We will figure out a date for me to do EGD 4-5 weeks out from the ERCP in order to remove the stent.    Pt has been scheduled for EGD with stent removal on 03-06-2016 at [facility]. Pt is aware. He will come to the office to sign the waiver and pick up his instructions.

## 2016-02-21 ENCOUNTER — Other Ambulatory Visit: Payer: Self-pay | Admitting: Surgery

## 2016-02-26 ENCOUNTER — Encounter (HOSPITAL_COMMUNITY): Payer: Self-pay | Admitting: *Deleted

## 2016-03-05 ENCOUNTER — Other Ambulatory Visit: Payer: Self-pay

## 2016-03-05 DIAGNOSIS — Z4689 Encounter for fitting and adjustment of other specified devices: Secondary | ICD-10-CM

## 2016-03-06 ENCOUNTER — Ambulatory Visit (HOSPITAL_COMMUNITY): Payer: 59 | Admitting: Anesthesiology

## 2016-03-06 ENCOUNTER — Encounter (HOSPITAL_COMMUNITY): Payer: Self-pay

## 2016-03-06 ENCOUNTER — Encounter (HOSPITAL_COMMUNITY)
Admission: RE | Disposition: A | Discharge status: Home / Self Care | Payer: Self-pay | Source: Ambulatory Visit | Attending: Gastroenterology

## 2016-03-06 ENCOUNTER — Ambulatory Visit: Payer: Worker's Compensation | Admitting: Gastroenterology

## 2016-03-06 ENCOUNTER — Ambulatory Visit (HOSPITAL_COMMUNITY)
Admission: RE | Admit: 2016-03-06 | Discharge: 2016-03-06 | Disposition: A | Discharge status: Home / Self Care | Payer: 59 | Source: Ambulatory Visit | Attending: Gastroenterology | Admitting: Gastroenterology

## 2016-03-06 DIAGNOSIS — Z4659 Encounter for fitting and adjustment of other gastrointestinal appliance and device: Secondary | ICD-10-CM | POA: Insufficient documentation

## 2016-03-06 DIAGNOSIS — Z9049 Acquired absence of other specified parts of digestive tract: Secondary | ICD-10-CM | POA: Diagnosis not present

## 2016-03-06 DIAGNOSIS — K838 Other specified diseases of biliary tract: Secondary | ICD-10-CM

## 2016-03-06 HISTORY — PX: GASTROINTESTINAL STENT REMOVAL: SHX6384

## 2016-03-06 HISTORY — PX: ESOPHAGOGASTRODUODENOSCOPY (EGD) WITH PROPOFOL: SHX5813

## 2016-03-06 HISTORY — DX: Personal history of urinary calculi: Z87.442

## 2016-03-06 HISTORY — DX: Family history of other specified conditions: Z84.89

## 2016-03-06 SURGERY — ESOPHAGOGASTRODUODENOSCOPY (EGD) WITH PROPOFOL
Anesthesia: Monitor Anesthesia Care

## 2016-03-06 MED ORDER — ONDANSETRON HCL 4 MG/2ML IJ SOLN
INTRAMUSCULAR | Status: AC
  Filled 2016-03-06: qty 2

## 2016-03-06 MED ORDER — ONDANSETRON HCL 4 MG/2ML IJ SOLN
INTRAMUSCULAR | Status: DC | PRN
  Administered 2016-03-06: 4 mg via INTRAVENOUS

## 2016-03-06 MED ORDER — PROPOFOL 10 MG/ML IV BOLUS
INTRAVENOUS | Status: DC | PRN
  Administered 2016-03-06: 50 mg via INTRAVENOUS
  Administered 2016-03-06 (×2): 20 mg via INTRAVENOUS

## 2016-03-06 MED ORDER — LACTATED RINGERS IV SOLN
INTRAVENOUS | Status: DC
Start: 2016-03-06 — End: 2016-03-06
  Administered 2016-03-06: 1000 mL via INTRAVENOUS
  Administered 2016-03-06: 07:00:00 via INTRAVENOUS

## 2016-03-06 MED ORDER — PROPOFOL 10 MG/ML IV BOLUS
INTRAVENOUS | Status: AC
  Filled 2016-03-06: qty 40

## 2016-03-06 MED ORDER — SODIUM CHLORIDE 0.9 % IV SOLN: INTRAVENOUS | Status: DC

## 2016-03-06 MED ORDER — PROPOFOL 500 MG/50ML IV EMUL
INTRAVENOUS | Status: DC | PRN
  Administered 2016-03-06: 100 ug/kg/min via INTRAVENOUS

## 2016-03-06 SURGICAL SUPPLY — 14 items

## 2016-03-06 NOTE — Discharge Instructions (Signed)
YOU HAD AN ENDOSCOPIC PROCEDURE TODAY: Refer to the procedure report and other information in the discharge instructions given to you for any specific questions about what was found during the examination. If this information does not answer your questions, please call [place] office at [phone] to clarify.  ° °YOU SHOULD EXPECT: Some feelings of bloating in the abdomen. Passage of more gas than usual. Walking can help get rid of the air that was put into your GI tract during the procedure and reduce the bloating. If you had a lower endoscopy (such as a colonoscopy or flexible sigmoidoscopy) you may notice spotting of blood in your stool or on the toilet paper. Some abdominal soreness may be present for a day or two, also. ° °DIET: Your first meal following the procedure should be a light meal and then it is ok to progress to your normal diet. A half-sandwich or bowl of soup is an example of a good first meal. Heavy or fried foods are harder to digest and may make you feel nauseous or bloated. Drink plenty of fluids but you should avoid alcoholic beverages for 24 hours. If you had a esophageal dilation, please see attached instructions for diet.   ° °ACTIVITY: Your care partner should take you home directly after the procedure. You should plan to take it easy, moving slowly for the rest of the day. You can resume normal activity the day after the procedure however YOU SHOULD NOT DRIVE, use power tools, machinery or perform tasks that involve climbing or major physical exertion for 24 hours (because of the sedation medicines used during the test).  ° °SYMPTOMS TO REPORT IMMEDIATELY: °A gastroenterologist can be reached at any hour. Please call [phone]  for any of the following symptoms:  °Following lower endoscopy (colonoscopy, flexible sigmoidoscopy) °Excessive amounts of blood in the stool  °Significant tenderness, worsening of abdominal pains  °Swelling of the abdomen that is new, acute  °Fever of 100° or  higher  °Following upper endoscopy (EGD, EUS, ERCP, esophageal dilation) °Vomiting of blood or coffee ground material  °New, significant abdominal pain  °New, significant chest pain or pain under the shoulder blades  °Painful or persistently difficult swallowing  °New shortness of breath  °Black, tarry-looking or red, bloody stools ° °FOLLOW UP:  °If any biopsies were taken you will be contacted by phone or by letter within the next 1-3 weeks. Call [phone]  if you have not heard about the biopsies in 3 weeks.  °Please also call with any specific questions about appointments or follow up tests. ° °

## 2016-03-06 NOTE — Anesthesia Preprocedure Evaluation (Signed)
Anesthesia Evaluation  Patient identified by MRN, date of birth, ID band Patient awake    Reviewed: Allergy & Precautions, NPO status , Patient's Chart, lab work & pertinent test results  History of Anesthesia Complications (+) Family history of anesthesia reaction  Airway Mallampati: II  TM Distance: >3 FB Neck ROM: Full    Dental no notable dental hx.    Pulmonary neg pulmonary ROS,    Pulmonary exam normal breath sounds clear to auscultation       Cardiovascular negative cardio ROS Normal cardiovascular exam Rhythm:Regular Rate:Normal     Neuro/Psych negative neurological ROS  negative psych ROS   GI/Hepatic negative GI ROS, Neg liver ROS,   Endo/Other  negative endocrine ROS  Renal/GU negative Renal ROS  negative genitourinary   Musculoskeletal negative musculoskeletal ROS (+)   Abdominal   Peds negative pediatric ROS (+)  Hematology negative hematology ROS (+)   Anesthesia Other Findings   Reproductive/Obstetrics negative OB ROS                             Anesthesia Physical Anesthesia Plan  ASA: II  Anesthesia Plan: MAC   Post-op Pain Management:    Induction: Intravenous  Airway Management Planned: Natural Airway  Additional Equipment:   Intra-op Plan:   Post-operative Plan:   Informed Consent: I have reviewed the patients History and Physical, chart, labs and discussed the procedure including the risks, benefits and alternatives for the proposed anesthesia with the patient or authorized representative who has indicated his/her understanding and acceptance.   Dental advisory given  Plan Discussed with: CRNA  Anesthesia Plan Comments:         Anesthesia Quick Evaluation

## 2016-03-06 NOTE — Op Note (Signed)
[facility] Patient Name: Raymond Kelly Procedure Date: 03/06/2016 MRN: [id number] Attending MD: Raymond Kelly , MD Date of Birth: [birth year] CSN: [id number] Age: 45 Admit Type: Outpatient Procedure:                Upper GI endoscopy Indications:              Biliary stent removal (previous bile leak) Providers:                Raymond Cotta. Loletha Carrow, MD, Raymond Igo, RN, Raymond Kelly, Technician, Raymond Bali, CRNA Referring MD:             Raymond Keens, MD Medicines:                Monitored Anesthesia Care Complications:            No immediate complications. Estimated Blood Loss:     Estimated blood loss: none. Procedure:                Pre-Anesthesia Assessment:                           - Prior to the procedure, a History and Physical                            was performed, and patient medications and                            allergies were reviewed. The patient's tolerance of                            previous anesthesia was also reviewed. The risks                            and benefits of the procedure and the sedation                            options and risks were discussed with the patient.                            All questions were answered, and informed consent                            was obtained. Prior Anticoagulants: The patient has                            taken no previous anticoagulant or antiplatelet                            agents. ASA Grade Assessment: II - A patient with                            mild systemic disease. After reviewing the risks  and benefits, the patient was deemed in                            satisfactory condition to undergo the procedure.                           After obtaining informed consent, the endoscope was                            passed under direct vision. Throughout the                            procedure, the patient's blood pressure,  pulse, and                            oxygen saturations were monitored continuously. The                            [id number]) scope was introduced through the                            mouth, and advanced to the second part of duodenum.                            The upper GI endoscopy was accomplished without                            difficulty. The patient tolerated the procedure                            well. Scope In: Scope Out: Findings:      The esophagus was normal.      The stomach was normal.      A previously placed plastic stent was seen in the ampulla. It was       draining bile from the side port. Stent removal was accomplished with a       snare. The stent was removed intact. Impression:               - Normal esophagus.                           - Normal stomach.                           - Plastic stent in the duodenum. Removed. Moderate Sedation:      MAC sedation used Recommendation:           - Patient has a contact number available for                            emergencies. The signs and symptoms of potential                            delayed complications were discussed with the  patient. Return to normal activities tomorrow.                            Written discharge instructions were provided to the                            patient.                           - Resume previous diet.                           - Continue present medications. Procedure Code(s):        --- Professional ---                           [phone], Esophagogastroduodenoscopy, flexible,                            transoral; with removal of foreign body(s) Diagnosis Code(s):        --- Professional ---                           Z46.59, Encounter for fitting and adjustment of                            other gastrointestinal appliance and device CPT copyright 2016 American Medical Association. All rights reserved. The codes documented in this  report are preliminary and upon coder review may  be revised to meet current compliance requirements. Raymond Hilger L. Loletha Carrow, MD 03/06/2016 7:48:31 AM This report has been signed electronically. Number of Addenda: 0

## 2016-03-06 NOTE — H&P (View-Only) (Signed)
Patient ID: Raymond Kelly, male   DOB: [birth year], 45 y.o.   MRN: [id number]    Progress Note   Subjective   Feels a lot better- can move around comfortably, shoulder pain gone, diet being advanced   Objective   Vital signs in last 24 hours: Temp:  [98.1 F (36.7 C)-99.2 F (37.3 C)] 98.8 F (37.1 C) (08/01 0525) Pulse Rate:  [58-76] 71 (08/01 0525) Resp:  [14-17] 16 (08/01 0525) BP: (121-144)/(64-85) 137/69 (08/01 0525) SpO2:  [96 %-100 %] 96 % (08/01 0525) Last BM Date: 03/02/16 General:    White male in NAD Heart:  Regular rate and rhythm; no murmurs Lungs: Respirations even and unlabored, lungs CTA bilaterally Abdomen:  Soft, tender and nondistended. Normal bowel sounds. Extremities:  Without edema. Neurologic:  Alert and oriented,  grossly normal neurologically. Psych:  Cooperative. Normal mood and affect.  Intake/Output from previous day: 07/31 0701 - 08/01 0700 In: 3113.3 [I.V.:3113.3] Out: 1050 [Urine:1050] Intake/Output this shift: Total I/O In: -  Out: 300 [Urine:300]  Lab Results:  Recent Labs  02/04/16 0110 02/05/16 0446  WBC 11.6* 8.3  HGB 15.5 14.3  HCT 44.8 42.1  PLT 246 236   BMET  Recent Labs  02/04/16 0110 02/05/16 0446  NA 137 140  K 4.4 4.4  CL 101 105  CO2 27 29  GLUCOSE 122* 108*  BUN 13 11  CREATININE 0.83 1.02  CALCIUM 8.9 8.6*   LFT  Recent Labs  02/05/16 0446  PROT 6.5  ALBUMIN 3.5  AST 108*  ALT 175*  ALKPHOS 123  BILITOT 2.8*   PT/INR No results for input(s): LABPROT, INR in the last 72 hours.  Studies/Results: Dg Abd 1 View  Result Date: 02/04/2016 CLINICAL DATA:  Bile leak, stent placement EXAM: ABDOMEN - 1 VIEW; DG C-ARM 1-60 MIN-NO REPORT COMPARISON:  CT abdomen 02/04/2016 FLUOROSCOPY TIME:  3 minutes 53 seconds Exposure:  147.24 mGy FINDINGS: Multiple images submitted. Surgical clips RIGHT upper quadrant from cholecystectomy. Extravasated contrast material is seen at the gallbladder fossa compatible with  bile leak. It is uncertain whether the extravasated contrast is arising from the cystic duct stump or a duct of Luschka. No definite filling defects identified within the opacified biliary tree. No biliary dilatation identified. CBD stent placed. Good drainage of contrast from the intrahepatic biliary tree following stent placement. IMPRESSION: Postoperative bile leak, for which a CBD stent was placed. Electronically Signed   By: Lavonia Dana M.D.   On: 02/04/2016 21:03   Nm Hepatobiliary Including Gb  Result Date: 02/04/2016 CLINICAL DATA:  Evaluate for bile leak. Recent laparoscopic cholecystectomy. EXAM: NUCLEAR MEDICINE HEPATOBILIARY IMAGING TECHNIQUE: Sequential images of the abdomen were obtained out to 60 minutes following intravenous administration of radiopharmaceutical. RADIOPHARMACEUTICALS:  5.1 mCi Tc-69m  Choletec IV COMPARISON:  CT scan abdomen/pelvis, same date. FINDINGS: There is symmetric uptake in the liver and an immediate collection of radiopharmaceutical in the right upper quadrant. This continues across the midline under the left hemidiaphragm and down the left paracolic gutter and into the pelvis. Findings are consistent with a bile leak. This would also correlate with the CT scan which shows fluid in the left paracolic gutter and moderate free fluid in the pelvis. IMPRESSION: Findings consistent with a bile leak. Electronically Signed   By: Marijo Sanes M.D.   On: 02/04/2016 13:00   Ct Abdomen Pelvis W Contrast  Result Date: 02/04/2016 CLINICAL DATA:  45 year old male status post cholecystectomy on 02/01/2016 presenting with abdominal pain  at the surgical site. EXAM: CT ABDOMEN AND PELVIS WITH CONTRAST TECHNIQUE: Multidetector CT imaging of the abdomen and pelvis was performed using the standard protocol following bolus administration of intravenous contrast. CONTRAST:  171mL ISOVUE-300 IOPAMIDOL (ISOVUE-300) INJECTION 61% COMPARISON:  CT dated 01/23/2016 FINDINGS: There are  subsegmental atelectatic changes of the lung bases. There is a small pneumoperitoneum, likely postoperative. Small free fluid is noted within the pelvis as well as a small perihepatic free fluid. Layering high attenuation within the pelvic fluid may represent serosanguineous or proteinaceous content. There is postsurgical changes of cholecystectomy with multiple surgical clips at the cholecystectomy bed. There is diffuse stranding of the fat at the operative bed as well as diffuse stranding of the upper mesentery, likely postsurgical. No loculated or walled-off fluid collection identified. No retained stone noted in the CBD. There is no biliary ductal dilatation. The 1.5 cm left hepatic hypodense lesion is not characterized but likely represent a cyst. There is apparent mild fatty infiltration of the liver. The pancreas, spleen, adrenal glands appear unremarkable. There is no hydronephrosis on either side. Subcentimeter right renal hypodense lesion are too small to characterize but likely represent cysts. The visualized ureters, urinary bladder, prostate, and the seminal vesicles are grossly unremarkable. There is no evidence of bowel obstruction or active inflammation. Normal appendix. The abdominal aorta and IVC appear unremarkable. No portal venous gas identified. The SMV, splenic vein, and main portal vein are patent. There is no adenopathy. There is a small fat containing left inguinal hernia. There is a small fat containing umbilical hernia. There is haziness of the periumbilical subcutaneous fat with multiple small pockets of gas, likely related to laparoscopic port placement. No fluid collection or hematoma. There is a small intramuscular lipoma in the right transverse abdominis muscle. The osseous structures appear unremarkable. IMPRESSION: Postsurgical changes of cholecystectomy with diffuse stranding of the upper abdominal mesentery and fat in the cholecystectomy bed. No drainable fluid collection or  abscess identified. Small perihepatic as well as pelvic free fluid, likely postoperative. A biliary leak is less likely but not entirely excluded. A hepatobiliary scintigraphy may provide better evaluation if there is clinical concern for biliary leak. Multiple scattered small pockets of intraperitoneal air, likely post cholecystectomy. Electronically Signed   By: Anner Crete M.D.   On: 02/04/2016 03:25  Dg C-arm 1-60 Min-no Report  Result Date: 02/04/2016 CLINICAL DATA: ERCP C-ARM 1-60 MINUTES Fluoroscopy was utilized by the requesting physician.  No radiographic interpretation.       Assessment / Plan:    #1 45 yo male s/p ERCP and Stent placement yesterday for bile leak Stable, feels much better #2 s/p lap chole 7/19 /2017  Gi will sign off, please call for questions  Appt  Made for office follow up with Dr Loletha Carrow  On 8/31 /2017 at 11 :30 am, anticipate stent removal in about 6 weeks  Active Problems:   Hyperbilirubinemia   Bile leak, postoperative     LOS: 1 day   Avriana Joo  02/05/2016, 11:11 AM

## 2016-03-06 NOTE — Transfer of Care (Signed)
Immediate Anesthesia Transfer of Care Note  Patient: JADARRIUS DUHART  Procedure(s) Performed: Procedure(s) with comments: ESOPHAGOGASTRODUODENOSCOPY (EGD) WITH PROPOFOL (N/A) - with stent removal. GASTROINTESTINAL STENT REMOVAL (N/A)  Patient Location: PACU  Anesthesia Type:MAC  Level of Consciousness:  sedated, patient cooperative and responds to stimulation  Airway & Oxygen Therapy:Patient Spontanous Breathing and Patient connected to face mask oxgen  Post-op Assessment:  Report given to PACU RN and Post -op Vital signs reviewed and stable  Post vital signs:  Reviewed and stable  Last Vitals:  Vitals:   03/06/16 0626  BP: 115/74  Pulse: (!) 54  Resp: 12  Temp: 123XX123 C    Complications: No apparent anesthesia complications

## 2016-03-06 NOTE — Anesthesia Postprocedure Evaluation (Signed)
Anesthesia Post Note  Patient: Raymond Kelly  Procedure(s) Performed: Procedure(s) (LRB): ESOPHAGOGASTRODUODENOSCOPY (EGD) WITH PROPOFOL (N/A) GASTROINTESTINAL STENT REMOVAL (N/A)  Patient location during evaluation: PACU Anesthesia Type: MAC Level of consciousness: awake and alert Pain management: pain level controlled Vital Signs Assessment: post-procedure vital signs reviewed and stable Respiratory status: spontaneous breathing, nonlabored ventilation, respiratory function stable and patient connected to nasal cannula oxygen Cardiovascular status: stable and blood pressure returned to baseline Anesthetic complications: no    Last Vitals:  Vitals:   03/06/16 0748 03/06/16 0750  BP:  108/60  Pulse:  (!) 52  Resp:  19  Temp: 36.5 C     Last Pain:  Vitals:   03/06/16 0748  TempSrc: Oral                 Luccia Reinheimer J

## 2016-03-06 NOTE — Interval H&P Note (Signed)
History and Physical Interval Note:  03/06/2016 7:27 AM  Raymond Kelly  has presented today for surgery, with the diagnosis of Stent Removal  The various methods of treatment have been discussed with the patient and family. After consideration of risks, benefits and other options for treatment, the patient has consented to  Procedure(s) with comments: ESOPHAGOGASTRODUODENOSCOPY (EGD) WITH PROPOFOL (N/A) - with stent removal. GASTROINTESTINAL STENT REMOVAL (N/A) as a surgical intervention .  The patient's history has been reviewed, patient examined, no change in status, stable for surgery.  I have reviewed the patient's chart and labs.  Questions were answered to the patient's satisfaction.    Patient recovered quickly after biliary stent placed for bile leak.  [name]

## 2016-03-07 ENCOUNTER — Encounter (HOSPITAL_COMMUNITY): Payer: Self-pay | Admitting: Gastroenterology

## 2016-05-23 ENCOUNTER — Ambulatory Visit: Admit: 2016-05-23 | Discharge: 2016-05-23 | Payer: BLUE CROSS/BLUE SHIELD | Attending: Family | Primary: Family

## 2016-05-23 DIAGNOSIS — Z0000 Encounter for general adult medical examination without abnormal findings: Secondary | ICD-10-CM

## 2016-05-23 LAB — AMB POC URINALYSIS DIP STICK MANUAL W/ MICRO
Amorphous Crystals: 0
Bilirubin (UA POC): NEGATIVE
Blood (UA POC): NEGATIVE
Casts: 0 [HPF] (ref 0–1)
Crystals (UA POC): NEGATIVE
Epithelial Cells: 0 [HPF] (ref 0–?)
Glucose (UA POC): NEGATIVE
Ketones (UA POC): NEGATIVE
Leukocyte esterase (UA POC): NEGATIVE
Nitrites (UA POC): NEGATIVE
Other:: 0
Protein (UA POC): NEGATIVE
RBC: 0 [HPF] (ref 0–3)
Specific gravity (UA POC): 1.005 (ref 1.003–1.035)
Urobilinogen (UA POC): 0.2 (ref 0.2–1)
WBC: 0 [HPF] (ref 0–3)
pH (UA POC): 6 (ref 4.6–8.0)

## 2016-05-23 NOTE — Progress Notes (Signed)
Chief Complaint   Patient presents with   ??? Complete Physical     fasting        Alexander Howell is a 45 y.o. male   who is here for a physical.  States diet has been balanced including fruits, vegetables and proteins. Patient is  exercising regularly.  Reports occasional alcohol use and does not use tobacco products.  Does see the dentist regularly and has kept up with opthalmologic exams as appropriate.  When patient is out in sun for extended periods of time sun block is  used. Reports stable mood.  ROS below for purpose of exam today.   Flu- needs today.   Has worn a cpap since 28.  Has had this machine x 7 years. He gets tubing and mask, and replaces them about every 6 months.       Past Medical History:   Diagnosis Date   ??? Allergic rhinitis, cause unspecified    ??? Essential hypertension, benign    ??? Mixed hyperlipidemia    ??? Sleep apnea        Current Outpatient Prescriptions   Medication Sig Dispense Refill   ??? meloxicam (MOBIC) 15 mg tablet TAKE ONE TABLET BY MOUTH ONCE DAILY 30 Tab 0   ??? cyclobenzaprine (FLEXERIL) 10 mg tablet Take 1 Tab by mouth three (3) times daily as needed for Muscle Spasm(s). 90 Tab 1   ??? cpap machine kit Product Unit Price Quantity Total   AirFit??? N20 Nasal CPAP Mask with Headgear - Medium $99.00 1 $99.00   Return Insurance For AirFit??? N20 Nasal CPAP Mask with Headgear $0.00 1 $0.00   White 6 Foot Performance 63m Tubing with 256mEasy Grip Cuffs $12.95 1 $12.95 1 Kit 0   ??? atorvastatin (LIPITOR) 40 mg tablet Take 1 Tab by mouth daily. 90 Tab 3   ??? lisinopril (PRINIVIL, ZESTRIL) 5 mg tablet Take 1 Tab by mouth daily. 90 Tab 3   ??? fluticasone (FLONASE) 50 mcg/actuation nasal spray 2 Sprays by Both Nostrils route daily. 3 Bottle 3   ??? cpap machine kit Needs new mask, hose, head gear, and filter for cpap therapy. 1 Kit 0       History reviewed. No pertinent surgical history.    Family History   Problem Relation Age of Onset   ??? Hypertension Mother    ??? High Cholesterol Mother     ??? Hypertension Father    ??? High Cholesterol Father    ??? Diabetes Maternal Aunt    ??? Heart Disease Paternal Uncle    ??? Heart Disease Paternal Grandfather        No Known Allergies    Social History     Social History   ??? Marital status: MARRIED     Spouse name: N/A   ??? Number of children: N/A   ??? Years of education: N/A     Occupational History   ??? Not on file.     Social History Main Topics   ??? Smoking status: Never Smoker   ??? Smokeless tobacco: Never Used   ??? Alcohol use 0.0 oz/week     0 Standard drinks or equivalent per week      Comment: monthly or less; 1-2 drinks   ??? Drug use: Not on file   ??? Sexual activity: Not on file     Other Topics Concern   ??? Not on file     Social History Narrative  Review of Systems - General ROS: negative for - chills, fatigue, fever, malaise, night sweats, weight gain or weight loss  Ophthalmic ROS: negative for - decreased vision, eye pain or loss of vision  ENT ROS: negative for - epistaxis, headaches, hearing change, nasal congestion, sinus pain, sore throat or tinnitus  Allergy and Immunology ROS: negative for - hives, itchy/watery eyes, nasal congestion or seasonal allergies  Hematological and Lymphatic ROS: negative for - bleeding problems, bruising, night sweats, swollen lymph nodes or weight loss  Endocrine ROS: negative for - breast changes, galactorrhea, hot flashes, mood swings, polydipsia/polyuria or unexpected weight changes  Breast ROS: negative for - changes  Respiratory ROS: negative for - cough, orthopnea, shortness of breath, tachypnea or wheezing  Cardiovascular ROS: negative for - chest pain, dyspnea on exertion, edema, irregular heartbeat, loss of consciousness, orthopnea, palpitations, paroxysmal nocturnal dyspnea, rapid heart rate or shortness of breath  Gastrointestinal ROS: negative for - abdominal pain, appetite loss, blood in stools, change in bowel habits, change in stools, constipation, diarrhea or heartburn   Genito-Urinary ROS: negative for - urinary hesitancy, urinary frequency, weak urinary stream, testicle lumps or pain, penile discharge  Musculoskeletal ROS: negative for - gait disturbance, joint pain, joint stiffness, joint swelling or muscular weakness  Neurological ROS: negative for - gait disturbance, headaches, impaired coordination/balance, memory loss, numbness/tingling, seizures, tremors or weakness  Dermatological ROS: negative for - dry skin, eczema, hair changes, mole changes, pruritus or rash          Vitals:    05/23/16 0816   BP: 139/90   Pulse: 66   Temp: 96.7 ??F (35.9 ??C)   TempSrc: Tympanic   Weight: 200 lb (90.7 kg)   Height: 5' 8"  (1.727 m)         Physical Examination: General appearance - alert, well appearing, and in no distress, oriented to person, place, and time, normal appearing weight and acyanotic, in no respiratory distress  Mental status - alert, oriented to person, place, and time, normal mood, behavior, speech, dress, motor activity, and thought processes  Eyes - pupils equal and reactive, extraocular eye movements intact, sclera anicteric  Ears - bilateral TM's and external ear canals normal  Nose - normal and patent, no erythema, discharge or polyps  Mouth - mucous membranes moist, pharynx normal without lesions, tonsils normal, dental hygiene good and tongue normal  Neck - supple, no significant adenopathy, carotids upstroke normal bilaterally, no bruits,   Thyroid -  thyroid is normal in size without nodules or tenderness  Lymphatics - no palpable lymphadenopathy, no hepatosplenomegaly  Chest - clear to auscultation, no wheezes, rales or rhonchi, symmetric air entry  Heart - normal rate, regular rhythm, normal S1, S2, no murmurs, rubs, clicks or gallops  Abdomen - soft, nontender, nondistended, no masses or organomegaly  GU - Normal genitalia, no testicular masses or hernias, Prostate Normal  Back exam - full range of motion, no tenderness, palpable spasm or pain on motion   Neurological - alert, oriented, normal speech, no focal findings or movement disorder noted  Musculoskeletal - no joint tenderness, deformity or swelling  Extremities - peripheral pulses normal, no pedal edema, no clubbing or cyanosis  Skin - normal coloration and turgor, no rashes, no suspicious skin lesions noted      Assessment/plan  Wellness visit- Overall appears to be in good physical condition.  Routine screening labs obtained and will be reviewed upon return. Encouraged to start or maintain healthy diet and or routine exercise. Reviewed  and discussed appropriate vaccines and screenings and administered needed vaccines or referrals for screening.  Reviewed safety issues common for age bracket. Fecal occult blood cards were  provided to patient for completion with explanation for their use.    1. Routine general medical examination at a health care facility    - Urinalysis w/ Micro (81000)  - Lipid Panel (80061)  - TSH (40102)  - CBC With Differential/Platelet (72536)  - Comp. Metabolic Panel (14) (64403)  - Prostate-Specific Ag, Serum (47425)    2. Essential hypertension, benign  B/p controlled. Patient has lost weight, doing well.   - Urinalysis w/ Micro (81000)  - Lipid Panel (80061)  - TSH (95638)  - CBC With Differential/Platelet (75643)  - Comp. Metabolic Panel (14) (32951)    3. Mixed hyperlipidemia  Recheck today.   - Urinalysis w/ Micro (81000)  - Lipid Panel (80061)  - TSH (88416)  - CBC With Differential/Platelet (60630)  - Comp. Metabolic Panel (14) (16010)    4. Prostate cancer screening    - Prostate-Specific Ag, Serum (93235)      Orders Placed This Encounter   ??? Lipid Panel (57322)   ??? TSH [phone])   ??? CBC With Differential/Platelet [phone])   ??? Comp. Metabolic Panel (14) (76283)   ??? Prostate-Specific Ag, Serum (15176)   ??? Urinalysis w/ Micro (81000)       Loretha Brasil, NP

## 2016-05-23 NOTE — Progress Notes (Signed)
Normal urine

## 2016-05-24 LAB — METABOLIC PANEL, COMPREHENSIVE
A-G Ratio: 2.4 — ABNORMAL HIGH (ref 1.2–2.2)
ALT (SGPT): 36 IU/L (ref 0–44)
AST (SGOT): 28 IU/L (ref 0–40)
Albumin: 4.8 g/dL (ref 3.5–5.5)
Alk. phosphatase: 52 IU/L (ref 39–117)
BUN/Creatinine ratio: 13 (ref 9–20)
BUN: 14 mg/dL (ref 6–24)
Bilirubin, total: 1.3 mg/dL — ABNORMAL HIGH (ref 0.0–1.2)
CO2: 22 mmol/L (ref 18–29)
Calcium: 9.3 mg/dL (ref 8.7–10.2)
Chloride: 98 mmol/L (ref 96–106)
Creatinine: 1.11 mg/dL (ref 0.76–1.27)
GFR est AA: 91 mL/min/{1.73_m2} (ref >59)
GFR est non-AA: 79 mL/min/{1.73_m2} (ref >59)
GLOBULIN, TOTAL: 2 g/dL (ref 1.5–4.5)
Glucose: 90 mg/dL (ref 65–99)
Potassium: 4.5 mmol/L (ref 3.5–5.2)
Protein, total: 6.8 g/dL (ref 6.0–8.5)
Sodium: 138 mmol/L (ref 134–144)

## 2016-05-24 LAB — CBC WITH AUTOMATED DIFF
ABS. BASOPHILS: 0 10*3/uL (ref 0.0–0.2)
ABS. EOSINOPHILS: 0.2 10*3/uL (ref 0.0–0.4)
ABS. IMM. GRANS.: 0 10*3/uL (ref 0.0–0.1)
ABS. MONOCYTES: 0.6 10*3/uL (ref 0.1–0.9)
ABS. NEUTROPHILS: 3.3 10*3/uL (ref 1.4–7.0)
Abs Lymphocytes: 2 10*3/uL (ref 0.7–3.1)
BASOPHILS: 1 %
EOSINOPHILS: 3 %
HCT: 44.8 % (ref 37.5–51.0)
HGB: 15.1 g/dL (ref 12.6–17.7)
IMMATURE GRANULOCYTES: 0 %
Lymphocytes: 33 %
MCH: 30.6 pg (ref 26.6–33.0)
MCHC: 33.7 g/dL (ref 31.5–35.7)
MCV: 91 fL (ref 79–97)
MONOCYTES: 9 %
NEUTROPHILS: 54 %
PLATELET: 227 10*3/uL (ref 150–379)
RBC: 4.94 x10E6/uL (ref 4.14–5.80)
RDW: 13.6 % (ref 12.3–15.4)
WBC: 6.1 10*3/uL (ref 3.4–10.8)

## 2016-05-24 LAB — TESTOSTERONE, TOTAL, ADULT MALE: Testosterone: 257 ng/dL — ABNORMAL LOW (ref 264–916)

## 2016-05-24 LAB — TSH 3RD GENERATION: TSH: 1.92 u[IU]/mL (ref 0.450–4.500)

## 2016-05-24 LAB — PSA, DIAGNOSTIC (PROSTATE SPECIFIC AG): Prostate Specific Ag: 0.2 ng/mL (ref 0.0–4.0)

## 2016-05-24 LAB — LIPID PANEL
Cholesterol, total: 139 mg/dL (ref 100–199)
HDL Cholesterol: 46 mg/dL (ref >39)
LDL, calculated: 59 mg/dL (ref 0–99)
Triglyceride: 169 mg/dL — ABNORMAL HIGH (ref 0–149)
VLDL, calculated: 34 mg/dL (ref 5–40)

## 2016-05-26 ENCOUNTER — Encounter

## 2016-05-26 MED ORDER — ATORVASTATIN 40 MG TAB
40 mg | ORAL_TABLET | Freq: Every day | ORAL | 3 refills | Status: DC
Start: 2016-05-26 — End: 2016-11-20

## 2016-05-26 MED ORDER — LISINOPRIL 5 MG TAB
5 mg | ORAL_TABLET | Freq: Every day | ORAL | 3 refills | Status: DC
Start: 2016-05-26 — End: 2017-06-13

## 2016-05-26 MED ORDER — ATORVASTATIN 40 MG TAB
40 mg | ORAL_TABLET | ORAL | 3 refills | Status: DC
Start: 2016-05-26 — End: 2017-06-13

## 2016-05-26 NOTE — Progress Notes (Signed)
choleseterol is good.   Thyroid good.   Blood counts good.   Glucose, kidney, liver, electrolytes are normal.   Prostate normal.   Testosterone is a little low at 257.  This isn't extremely low, but is low enough to cause some fatigue, type symptoms.  My suggestion is we would want to make sure his sleep apnea is controlled properly first, because if it isn't can cause testosterone to drop.  If it is controlled, and having fatigue symptoms, could f/u with me to discuss treatment options. Let me know if he needs referral to sleep center for a titration study.

## 2016-05-26 NOTE — Progress Notes (Signed)
lvm for a call back

## 2016-05-26 NOTE — Telephone Encounter (Signed)
From: Bethanne GingerJames D Sturdivant  To: Earney HamburgMatthew T Keaton, NP  Sent: 05/26/2016 8:20 AM EST  Subject:  Medication Renewal Request    Original  authorizing provider: Earney HamburgMatthew T Keaton, NP    Alexander Howell would like a refill of the following medications:  atorvastatin  (LIPITOR) 40 mg tablet [Matthew T Keaton, NP]  lisinopril  (PRINIVIL, ZESTRIL) 5 mg tablet Earney Hamburg[Matthew T Keaton, NP]    Preferred  pharmacy: [facility] - [place], [place] - [address]    Comment:  I???m  out of Lipitor

## 2016-05-26 NOTE — Telephone Encounter (Signed)
erxd

## 2016-05-26 NOTE — Telephone Encounter (Signed)
From: Bethanne GingerJames D Gaydos  To: Earney HamburgMatthew T Eldena Dede, NP  Sent: 05/26/2016 8:24 AM EST  Subject:  Referral Request    Sleep  center referral please.     Thanks

## 2016-05-27 NOTE — Progress Notes (Signed)
Pt saw results on line

## 2016-06-02 NOTE — Addendum Note (Signed)
Addended by: Sophronia SimasKEATON, Blaine Hari T on: 06/02/2016 04:53 PM      Modules accepted: Orders

## 2016-06-26 DIAGNOSIS — G4733 Obstructive sleep apnea (adult) (pediatric): Secondary | ICD-10-CM

## 2016-06-27 ENCOUNTER — Inpatient Hospital Stay: Admit: 2016-06-27 | Payer: BLUE CROSS/BLUE SHIELD | Primary: Family

## 2016-07-10 NOTE — Progress Notes (Signed)
Negative sleep study AHI 2.5

## 2016-07-11 MED ORDER — MELOXICAM 15 MG TAB
15 mg | ORAL_TABLET | ORAL | 0 refills | Status: DC
Start: 2016-07-11 — End: 2017-12-03

## 2016-07-11 NOTE — Telephone Encounter (Signed)
Rx sent  Alexander Landgren A Dorna-Pesquera, MD

## 2016-07-13 ENCOUNTER — Encounter

## 2016-07-14 MED ORDER — CPAP MACHINE
PACK | 0 refills | Status: AC
Start: 2016-07-14 — End: ?

## 2016-07-14 NOTE — Telephone Encounter (Signed)
Printed, have him f/u to discuss results. There a few things to talk about and it would be better suited in an office visit.

## 2016-07-14 NOTE — Telephone Encounter (Signed)
From: Thomas Hoff  To: Loretha Brasil, NP  Sent: 07/13/2016 10:39 PM EST  Subject:  Medication Renewal Request    Original  authorizing provider: Loretha Brasil, NP    Alexander Howell. Alexander Howell would like a refill of the following medications:  cpap  machine kit [Alexander T Milana Obey, NP]  cpap  machine kit Loretha Brasil, NP]    Preferred  pharmacy: [facility]    Comment:  What  were the results of my sleep study?

## 2016-07-16 NOTE — Progress Notes (Signed)
I spoke with the patient regarding his sleep study. He states that he has been on CPAP for 20+ years. His referring did not have him stop CPAP 3 days before having his study. The patient believes that his study was not correct. Kenderic Hooks re- scored his sleep study. Auto CPAP is now recommended. Kenderic called and spoke with the patient and he states that he understands.

## 2016-08-08 ENCOUNTER — Ambulatory Visit: Admit: 2016-08-08 | Discharge: 2016-08-08 | Payer: BLUE CROSS/BLUE SHIELD | Primary: Family

## 2016-08-08 DIAGNOSIS — G4733 Obstructive sleep apnea (adult) (pediatric): Secondary | ICD-10-CM

## 2016-08-08 NOTE — Progress Notes (Signed)
[facility]  [address] [place] [address]  [phone]    Patient Name:  Alexander Howell  Date of Birth:  1970/11/14      Office Visit 08/08/2016    CHIEF COMPLAINT:    Chief Complaint   Patient presents with   ??? New Patient       HISTORY OF PRESENT ILLNESS:      The patient present in outpatient consultation at the request of Dr. Katherina Right for management of obstructive sleep apnea.  Patient has a history of hypertension, high cholesterol, obstructive sleep apnea and allergic rhinitis.  He had a sleep study 19 years ago and [place].  He has been compliant with CPAP therapy  for the past 19 years.  His current machine is 66 or 48 years old.  It is not working properly and needs to be replaced.  Patient did not bring his machine with him today so we are unable to verify his settings.  For the past 3-4 years, he has had increased daytime sleepiness.  He states that he gets very sleepy in the afternoons and has to drink an additional cup of coffee each day.  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  He does get sleepy occasionally when he tries and has to pull over at a rest area.  He reports symptoms including snoring, witnessed apneas, nocturnal gasping, dry mouth, wake up and cannot go back to sleep, sad or depressed, thoughts racing, wake up earlier than would like, trouble concentrating, trouble remembering, sleep attacks, vivid dreamlike scenes upon awakening, body jerking and kicking at night when he does not use his CPAP.  Epworth score is 19/24 on his current CPAP settings.  He reports that he takes naps on average once weekly.  He goes to sleep around 10 or 11 at night and wakes around 6 or 7 in the morning.  He reports 7 hours of actual sleep and denies any nocturnal awakenings.  He reports that it takes him seconds to fall asleep.  He has never had any nasal or throat surgeries.  He drinks  approximately less than 1 alcoholic beverage per month.  He reports 2-3 caffeinated beverages per day.  He recently quit smokeless tobacco in January and is currently on the nicotine patch today.    Patient had a repeat PSG on June 26, 2016.  Patient's sleep efficiency was 97.6%.  He had 9 obstructive apneas and 25 obstructive hypopneas with an average AHI 5.1 events per hour.  RDI was 6.6 including 10 RERA's.  His supine AHI was elevated to 7.2 events per hour and REM AHI was 12 events per hour.  His lowest O2 sat was 83% for a total of 11.8 minutes of the test.  He did have moderate periodic limb movements with PLM arousal index of 9.4.  Patient states that he was never told to discontinue CPAP therapy prior to his sleep study and he used his CPAP the night before his sleep study.  His only medications include atorvastatin and lisinopril.  He does have as needed Flexeril for a herniated disc in his back but takes this on average once every 6 months.  He denies any other pain medication use.     Patient was asking about the oral appliance.  He is interested in the past but due to insurance coverage, he would like to continue to stay on the CPAP machine.  He denies  having any problems tolerating the machine.  He is currently using a nasal mask but  is asking if there is anything else for him to use.            Sleepiness Scale:     Sitting and reading 3    Watching TV 3    Sitting inactive in a public place 3    As a passenger in a car for an hour without a break 3    Lying down to rest in the afternoon when circumstances Permits 3    Sitting and talking to someone 1    Sitting quietly after lunch without alcohol 2    In a car, while stopped for a few minutes 1    Total :  19/24    Past Medical History:   Diagnosis Date   ??? Allergic rhinitis, cause unspecified    ??? Essential hypertension, benign    ??? Mixed hyperlipidemia    ??? Sleep apnea          Problem List  Date Reviewed: 05/25/2016           Codes Class Noted    OSA (obstructive sleep apnea) ICD-10-CM: G47.33  ICD-9-CM: 327.23  08/08/2016        Hypersomnia ICD-10-CM: G47.10  ICD-9-CM: 780.54  08/08/2016        Hypoxia ICD-10-CM: R09.02  ICD-9-CM: 799.02  08/08/2016        Essential hypertension, benign ICD-10-CM: I10  ICD-9-CM: 401.1  01/09/2014        Mixed hyperlipidemia ICD-10-CM: R44.3  ICD-9-CM: 272.2  01/09/2014                History reviewed. No pertinent surgical history.    No flowsheet data found.      Social History     Social History   ??? Marital status: MARRIED     Spouse name: N/A   ??? Number of children: N/A   ??? Years of education: N/A     Occupational History   ??? Not on file.     Social History Main Topics   ??? Smoking status: Never Smoker   ??? Smokeless tobacco: Never Used   ??? Alcohol use 0.0 oz/week     0 Standard drinks or equivalent per week      Comment: monthly or less; 1-2 drinks   ??? Drug use: Not on file   ??? Sexual activity: Not on file     Other Topics Concern   ??? Not on file     Social History Narrative         Family History   Problem Relation Age of Onset   ??? Hypertension Mother    ??? High Cholesterol Mother    ??? Hypertension Father    ??? High Cholesterol Father    ??? Diabetes Maternal Aunt    ??? Heart Disease Paternal Uncle    ??? Heart Disease Paternal Grandfather          No Known Allergies      Current Outpatient Prescriptions   Medication Sig   ??? cpap machine kit Needs new mask, hose, head gear, and filter for cpap therapy.   ??? cpap machine kit Product Unit Price Quantity Total   AirFit??? N20 Nasal CPAP Mask with Headgear - Medium $99.00 1 $99.00   Return Insurance For AirFit??? N20 Nasal CPAP Mask with Headgear $0.00 1 $0.00   White 6 Foot Performance 35m Tubing with 279mEasy Grip Cuffs $  12.95 1 $12.95   ??? meloxicam (MOBIC) 15 mg tablet TAKE ONE TABLET BY MOUTH ONCE DAILY   ??? atorvastatin (LIPITOR) 40 mg tablet TAKE ONE TABLET BY MOUTH ONCE DAILY   ??? atorvastatin (LIPITOR) 40 mg tablet Take 1 Tab by mouth daily.    ??? lisinopril (PRINIVIL, ZESTRIL) 5 mg tablet Take 1 Tab by mouth daily.   ??? cyclobenzaprine (FLEXERIL) 10 mg tablet Take 1 Tab by mouth three (3) times daily as needed for Muscle Spasm(s).   ??? fluticasone (FLONASE) 50 mcg/actuation nasal spray 2 Sprays by Both Nostrils route daily.     No current facility-administered medications for this visit.            REVIEW OF SYSTEMS:   CONSTITUTIONAL:   There is no history of fever, chills, night sweats, weight loss, weight gain, persistent fatigue, or lethargy/hypersomnolence.   EYES:   Denies problems with eye pain, erythema, blurred vision, or visual field loss.   ENTM:   Denies history of tinnitus, epistaxis, sore throat, hoarseness, or dysphonia.   LYMPH:   Denies swollen glands.   CARDIAC:   No chest pain, pressure, discomfort, palpitations, orthopnea, murmurs, or edema.   GI:   No dysphagia, heartburn reflux, nausea/vomiting, diarrhea, abdominal pain, or bleeding.   GU:   Denies history of dysuria, hematuria, polyuria, or decreased urine output.   MS:   No history of myalgias, arthralgias, bone pain, or muscle cramps.   SKIN:   No history of rashes, jaundice, cyanosis, nodules, or ulcers.   ENDO:   Negative for heat or cold intolerance.  No history of DM.   PSYCH:   Negative for anxiety, depression, insomnia, hallucinations.   NEURO:   There is no history of AMS, persistent headache, decreased level of consciousness, seizures, or motor or sensory deficits.      PHYSICAL EXAM:    Visit Vitals   ??? BP 118/72   ??? Pulse 82   ??? Resp 16   ??? Ht 5' 8"  (1.727 m)   ??? Wt 209 lb (94.8 kg)   ??? SpO2 98%   ??? BMI 31.78 kg/m2        GENERAL APPEARANCE:   The patient is obese and in no respiratory distress.   HEENT:   PERRL.  Conjunctivae unremarkable.   Nasal mucosa is without epistaxis, exudate, or polyps.  Gums and dentition are unremarkable.  There is moderate oropharyngeal narrowing.  TMs are clear.   NECK/LYMPHATIC:    Symmetrical with no elevation of jugular venous pulsation.  Trachea midline. No thyroid enlargement.  No cervical adenopathy.   LUNGS:   Normal respiratory effort with symmetrical lung expansion.   Breath sounds clear bilaterally.   HEART:   There is a regular rate and rhythm.  No murmur, rub, or gallop.  There is no edema in the lower extremities.   ABDOMEN:   Soft and non-tender.  No hepatosplenomegaly.  Bowel sounds are normal.     SKIN:   There are no rashes, cyanosis, jaundice, or ecchymosis present.   EXTREMITIES:   The extremities are unremarkable without clubbing, cyanosis, joint inflammation, degenerative, or ischemic change.   MUSCULOSKELETAL:   There is no abnormal tone, muscle atrophy, or abnormal movement present.   NEURO:   The patient is alert and oriented to person, place, and time.  Memory appears intact and mood is normal.  No gross sensorimotor deficits are present.      DIAGNOSTIC TESTS:   See sleep study above  ASSESSMENT:  (Medical Decision Making)         ICD-10-CM ICD-9-CM    1. OSA (obstructive sleep apnea)-- mild per recent sleep study but this may not be completely accurate since patient used PAP therapy the night prior to the sleep study. APAP 5-15cm will be ordered for the patient. 2 week download will be checked and patient will be notified.    G47.33 327.23 AMB SUPPLY ORDER   2. Hypoxia-- moderate R09.02 799.02 AMB SUPPLY ORDER   3. Hypersomnia-- severe. Patient advised to avoid driving with EDS. This will need to be re-evaluated at next visit.  G47.10 780.54 AMB SUPPLY ORDER          PLAN:  Start APAP 5-15 cm h20 with heated humidity. He will go to [place] to pick up his machine and supplies today.   Sleep hygiene and positional therapy were recommended.  Patient was encouraged to avoid driving with excessive daytime sleepiness.   2 week download will be checked.     Follow up in 3 months or sooner if needed         Orders Placed This Encounter   ??? AMB SUPPLY ORDER      Supervising Physician: Dr. Anastasio Champion     Over 50% of today's office visit was spent in face to face time reviewing test results, prognosis, importance of compliance, education about disease process, benefits of medications, instructions for management of acute flare-ups, and follow up plans.  Total face to face time spent with patient was 45 minutes.    Angelena Form, NP  Electronically signed

## 2016-08-08 NOTE — Patient Instructions (Signed)
Learning About Sleeping Well  What does sleeping well mean?  Sleeping well means getting enough sleep. How much sleep is enough varies among people.  The number of hours you sleep is not as important as how you feel when you wake up. If you do not feel refreshed, you probably need more sleep. Another sign of not getting enough sleep is feeling tired during the day.  The average total nightly sleep time is 7?? to 8 hours. Healthy adults may need a little more or a little less than this.  Why is getting enough sleep important?  Getting enough quality sleep is a basic part of good health. When your sleep suffers, your mood and your thoughts can suffer too. You may find yourself feeling more grumpy or stressed. Not getting enough sleep also can lead to serious problems, including injury, accidents, anxiety, and depression.  What might cause poor sleeping?  Many things can cause sleep problems, including:  ?? Stress. Stress can be caused by fear about a single event, such as giving a speech. Or you may have ongoing stress, such as worry about work or school.  ?? Depression, anxiety, and other mental or emotional conditions.  ?? Changes in your sleep habits or surroundings. This includes changes that happen where you sleep, such as noise, light, or sleeping in a different bed. It also includes changes in your sleep pattern, such as having jet lag or working a late shift.  ?? Health problems, such as pain, breathing problems, and restless legs syndrome.  ?? Lack of regular exercise.  How can you help yourself?  Here are some tips that may help you sleep more soundly and wake up feeling more refreshed.  Your sleeping area   ?? Use your bedroom only for sleeping and sex. A bit of light reading may help you fall asleep. But if it doesn't, do your reading elsewhere in the house. Don't watch TV in bed.  ?? Be sure your bed is big enough to stretch out comfortably, especially if you have a sleep partner.   ?? Keep your bedroom quiet, dark, and cool. Use curtains, blinds, or a sleep mask to block out light. To block out noise, use earplugs, soothing music, or a "white noise" machine.  Your evening and bedtime routine   ?? Get regular exercise, but not within 3 to 4 hours before your bedtime.  ?? Create a relaxing bedtime routine. You might want to take a warm shower or bath, listen to soothing music, or drink a cup of noncaffeinated tea.  ?? Go to bed at the same time every night. And get up at the same time every morning, even if you feel tired.  What to avoid   ?? Limit caffeine (coffee, tea, caffeinated sodas) during the day, and don't have any for at least 4 to 6 hours before bedtime.  ?? Don't drink alcohol before bedtime. Alcohol can cause you to wake up more often during the night.  ?? Don't smoke or use tobacco, especially in the evening. Nicotine can keep you awake.  ?? Don't take naps during the day, especially close to bedtime.  ?? Don't lie in bed awake for too long. If you can't fall asleep, or if you wake up in the middle of the night and can't get back to sleep within 15 minutes or so, get out of bed and go to another room until you feel sleepy.  ?? Don't take medicine right before bed that may keep you   awake or make you feel hyper or energized. Your doctor can tell you if your medicine may do this and if you can take it earlier in the day.  If you can't sleep   ?? Imagine yourself in a peaceful, pleasant scene. Focus on the details and feelings of being in a place that is relaxing.  ?? Get up and do a quiet or boring activity until you feel sleepy.  ?? Don't drink any liquids after 6 p.m. if you wake up often because you have to go to the bathroom.   Where can you learn more?   Go to http://www.healthwise.net/[facility]  Enter J942 in the search box to learn more about "Learning About Sleeping Well."   ?? 2006-2014 Healthwise, Incorporated. Care instructions adapted under  license by [place] (which disclaims liability or warranty for this information). This care instruction is for use with your licensed healthcare professional. If you have questions about a medical condition or this instruction, always ask your healthcare professional. Healthwise, Incorporated disclaims any warranty or liability for your use of this information.  Content Version: 10.1.311062; Current as of: September 15, 2012              Sleep Apnea: After Your Visit  Your Care Instructions  Sleep apnea means that you frequently stop breathing for 10 seconds or longer during sleep. It can be mild to severe, based on the number of times an hour that you stop breathing or have slowed breathing.  Blocked or narrowed airways in your nose, mouth, or throat can cause sleep apnea. Your airway can become blocked when your throat muscles and tongue relax during sleep.  You can treat sleep apnea at home by making lifestyle changes. You also can use a CPAP breathing machine that keeps tissues in the throat from blocking your airway. Or your doctor may suggest that you use a breathing device while you sleep. It helps keep your airway open. This could be a device that you put in your mouth. Other examples include strips or disks that you use on your nose. In some cases, surgery may be needed to remove enlarged tissues in the throat.  Follow-up care is a key part of your treatment and safety. Be sure to make and go to all appointments, and call your doctor if you are having problems. It's also a good idea to know your test results and keep a list of the medicines you take.  How can you care for yourself at home?  ?? Lose weight, if needed. It may reduce the number of times you stop breathing or have slowed breathing.  ?? Sleep on your side. It may stop mild apnea. If you tend to roll onto your back, sew a pocket in the back of your pajama top. Put a tennis ball  into the pocket, and stitch the pocket shut. This will help keep you from sleeping on your back.  ?? Avoid alcohol and medicines such as sleeping pills and sedatives before bed.  ?? Do not smoke. Smoking can make sleep apnea worse. If you need help quitting, talk to your doctor about stop-smoking programs and medicines. These can increase your chances of quitting for good.  ?? Prop up the head of your bed 4 to 6 inches by putting bricks under the legs of the bed.  ?? Treat breathing problems, such as a stuffy nose, caused by a cold or allergies.  ?? Try a continuous positive airway pressure (CPAP) breathing machine if your   doctor recommends it. The machine keeps your airway open when you sleep.  ?? If CPAP does not work for you, ask your doctor if you can try other breathing machines. A bilevel positive airway pressure machine uses one type of air pressure for breathing in and another type for breathing out. Another device raises or lowers air pressure as needed while you breathe.  ?? Talk to your doctor if:  ?? Your nose feels dry or bleeds when you use one of these machines. You may need to increase moisture in the air. A humidifier may help.  ?? Your nose is runny or stuffy from using a breathing machine. Decongestants or a corticosteroid nasal spray may help.  ?? You are sleepy during the day and it gets in the way of the normal things you do. Do not drive when you are drowsy.  When should you call for help?  Watch closely for changes in your health, and be sure to contact your doctor if:  ?? You still have sleep apnea even though you have made lifestyle changes.  ?? You are thinking of trying a device such as CPAP.  ?? You are having problems using a CPAP or similar machine.   Where can you learn more?   Go to http://www.healthwise.net/[facility]  Enter J936 in the search box to learn more about "Sleep Apnea: After Your Visit."   ?? 2006-2014 Healthwise, Incorporated. Care instructions adapted under  license by [place] (which disclaims liability or warranty for this information). This care instruction is for use with your licensed healthcare professional. If you have questions about a medical condition or this instruction, always ask your healthcare professional. Healthwise, Incorporated disclaims any warranty or liability for your use of this information.  Content Version: 10.1.311062; Current as of: July 20, 2012                        Eating Healthy Foods: After Your Visit  Your Care Instructions  Eating healthy foods can help lower your risk for disease. Healthy food gives you energy and keeps your heart strong, your brain active, your muscles working, and your bones strong.  A healthy diet includes a variety of foods from the basic food groups: grains, vegetables, fruits, milk and milk products, and meat and beans. Some people may eat more of their favorite foods from only one food group and, as a result, miss getting the nutrients they need. So, it is important to pay attention not only to what you eat but also to what you are missing from your diet. You can eat a healthy, balanced diet by making a few small changes.  Follow-up care is a key part of your treatment and safety. Be sure to make and go to all appointments, and call your doctor if you are having problems. It???s also a good idea to know your test results and keep a list of the medicines you take.  How can you care for yourself at home?  Look at what you eat  ?? Keep a food diary for a week or two and record everything you eat or drink. Track the number of servings you eat from each food group.  ?? For a balanced diet every day, eat a variety of:  ?? 6 or more ounce-equivalents of grains, such as cereals, breads, crackers, rice, or pasta, every day. An ounce-equivalent is 1 slice of bread, 1 cup of ready-to-eat cereal, or ?? cup of cooked rice, cooked   pasta, or cooked cereal.  ?? 2?? cups of vegetables, especially:   ?? Dark-green vegetables such as broccoli and spinach.  ?? Orange vegetables such as carrots and sweet potatoes.  ?? Dry beans (such as pinto and kidney beans) and peas (such as lentils).  ?? 2 cups of fresh, frozen, or canned fruit. A small apple or 1 banana or orange equals 1 cup.  ?? 3 cups of nonfat or low-fat milk, yogurt, or other milk products.  ?? 5?? ounces of meat and beans, such as chicken, fish, lean meat, beans, nuts, and seeds. One egg, 1 tablespoon of peanut butter, ?? ounce nuts or seeds, or ?? cup of cooked beans equals 1 ounce of meat.  ?? Learn how to read food labels for serving sizes and ingredients. Fast-food and convenience-food meals often contain few or no fruits or vegetables. Make sure you eat some fruits and vegetables to make the meal more nutritious.  ?? Look at your food diary. For each food group, add up what you have eaten and then divide the total by the number of days. This will give you an idea of how much you are eating from each food group. See if you can find some ways to change your diet to make it more healthy.  Start small  ?? Do not try to make dramatic changes to your diet all at once. You might feel that you are missing out on your favorite foods and then be more likely to fail.  ?? Start slowly, and gradually change your habits. Try some of the following:  ?? Use whole wheat bread instead of white bread.  ?? Use nonfat or low-fat milk instead of whole milk.  ?? Eat brown rice instead of white rice, and eat whole wheat pasta instead of white-flour pasta.  ?? Try low-fat cheeses and low-fat yogurt.  ?? Add more fruits and vegetables to meals and have them for snacks.  ?? Add lettuce, tomato, cucumber, and onion to sandwiches.  ?? Add fruit to yogurt and cereal.  Enjoy food  ?? You can still eat your favorite foods. You just may need to eat less of them. If your favorite foods are high in fat, salt, and sugar, limit how often you eat them, but do not cut them out entirely.   ?? Eat a wide variety of foods.  Make healthy choices when eating out  ?? The type of restaurant you choose can help you make healthy choices. Even fast-food chains are now offering more low-fat or healthier choices on the menu.  ?? Choose smaller portions, or take half of your meal home.  ?? When eating out, try:  ?? A veggie pizza with a whole wheat crust or grilled chicken (instead of sausage or pepperoni).  ?? Pasta with roasted vegetables, grilled chicken, or marinara sauce instead of cream sauce.  ?? A vegetable wrap or grilled chicken wrap.  ?? Broiled or poached food instead of fried or breaded items.  Make healthy choices easy  ?? Buy packaged, prewashed, ready-to-eat fresh vegetables and fruits, such as baby carrots, salad mixes, and chopped or shredded broccoli and cauliflower.  ?? Buy packaged, presliced fruits, such as melon or pineapple.  ?? Choose 100% fruit or vegetable juice instead of soda. Limit juice intake to 4 to 6 oz (?? to ?? cup) a day.  ?? Blend low-fat yogurt, fruit juice, and canned or frozen fruit to make a smoothie for breakfast or a snack.   Where can   you learn more?   Go to http://www.healthwise.net/[facility]  Enter T756 in the search box to learn more about "Eating Healthy Foods: After Your Visit."   ?? 2006-2014 Healthwise, Incorporated. Care instructions adapted under license by [place] (which disclaims liability or warranty for this information). This care instruction is for use with your licensed healthcare professional. If you have questions about a medical condition or this instruction, always ask your healthcare professional. Healthwise, Incorporated disclaims any warranty or liability for your use of this information.  Content Version: 10.1.311062; Current as of: Nov 21, 2011                                    Learning About Physical Activity  What is physical activity?  Physical activity is any kind of activity that gets your body moving.  Being active means allowing your body to "practice" breathing, stretching, and lifting. The more practice your body gets, the better it works.  The types of physical activity that can help you get fit and stay healthy include:  ?? Aerobic or "cardio" activities that make your heart beat faster and make you breathe harder, such as brisk walking, riding a bike, or running. Aerobic activities strengthen your heart and lungs and build up your endurance.  ?? Strength training activities that make your muscles work against, or "resist," something, such as lifting weights or doing push-ups. These activities help tone and strengthen your muscles.  ?? Stretches that allow you to move your joints and muscles through their full range of motion. Stretching helps you be more flexible and avoid injury.  What are the benefits of physical activity?  Being active is one of the best things you can do to get fit and stay healthy. It helps you to:  ?? Feel stronger and have more energy to do all the things you like to do.  ?? Focus better at school or work and perform better in sports.  ?? Feel, think, and sleep better.  ?? Reach and stay at a healthy weight.  ?? Lose fat and build lean muscle.  ?? Lower your risk for serious health problems.  ?? Keep your bones, muscles, and joints strong.  Being fit lets you do more physical activity. And it lets you work out harder without as much effort.  How can you make physical activity part of your life?  Get at least 30 minutes of exercise on most days of the week. Walking is a good choice. You also may want to do other activities, such as running, swimming, cycling, or playing tennis or team sports.  Pick activities that you like???ones that make your heart beat faster, your muscles stronger, and your muscles and joints more flexible. If you find more than one thing you like doing, do them all. You don't have to do the same thing every day.   Get your heart pumping every day. Any activity that makes your heart beat faster and keeps it at that rate for a while counts.  Here are some great ways to get your heart beating faster:  ?? Go for a brisk walk, run, or bike ride.  ?? Go for a hike or swim.  ?? Go in-line skating.  ?? Play a game of touch football, basketball, or soccer.  ?? Ride a bike.  ?? Play tennis or racquetball.  ?? Climb stairs.  Even some household chores can be aerobic???just do   them at a faster pace. Vacuuming, raking or mowing the lawn, sweeping the garage, and washing and waxing the car all can help get your heart rate up.  Strengthen your muscles during the week. You don't have to lift heavy weights or grow big, bulky muscles to get stronger. Doing a few simple activities that make your muscles work against, or "resist," something can help you get stronger.  For example, you can:  ?? Do push-ups or sit-ups, which use your own body weight as resistance.  ?? Lift weights or dumbbells or use stretch bands at home or in a gym or community center.  Stretch your muscles often. Stretching will help you as you become more active. It can help you stay flexible, loosen tight muscles, and avoid injury. It can also help improve your balance and posture and can be a great way to relax.  Be sure to stretch the muscles you'll be using when you work out. It???s best to warm your muscles slightly before you stretch them. Walk or do some other light aerobic activity for a few minutes, and then start stretching.  When you stretch your muscles:  ?? Do it slowly. Stretching is not about going fast or making sudden movements.  ?? Don't push or bounce during a stretch.  ?? Hold each stretch for at least 15 to 30 seconds, if you can. You should feel a stretch in the muscle, but not pain.  ?? Breathe out as you do the stretch. Then breathe in as you hold the stretch. Don't hold your breath.  If you're worried about how more activity might affect your health, have a  checkup before you start. Follow any special advice your doctor gives you for getting a smart start.   Where can you learn more?   Go to http://www.healthwise.net/[facility]  Enter W332 in the search box to learn more about "Learning About Physical Activity."   ?? 2006-2014 Healthwise, Incorporated. Care instructions adapted under license by [place] (which disclaims liability or warranty for this information). This care instruction is for use with your licensed healthcare professional. If you have questions about a medical condition or this instruction, always ask your healthcare professional. Healthwise, Incorporated disclaims any warranty or liability for your use of this information.  Content Version: 10.1.311062; Current as of: May 21, 2012                                          Learning About CPAP for Sleep Apnea  What is CPAP?     CPAP is a small machine that you use at home every night while you sleep. It increases air pressure in your throat to keep your airway open. When you have sleep apnea, this can help you sleep better so you feel much better. CPAP stands for "continuous positive airway pressure."  The CPAP machine will have one of the following:  ?? A mask that covers your nose and mouth  ?? Prongs that fit into your nose  ?? A mask that covers your nose only, the most common type. This type is called NCPAP. The N stands for "nasal."  Why is it done?  CPAP is usually the best treatment for obstructive sleep apnea. It is the first treatment choice and the most widely used. Your doctor may suggest CPAP if you have:  ?? Moderate to severe sleep apnea.  ?? Sleep   apnea and coronary artery disease (CAD) or heart failure.  How does it help?  ?? CPAP can help you have more normal sleep, so you feel less sleepy and more alert during the daytime.  ?? CPAP may help keep heart failure or other heart problems from getting worse.  ?? CPAP may help lower your blood pressure.   ?? If you use CPAP, your bed partner may also sleep better because you are not snoring or restless.  What are the side effects?  Some people who use CPAP have:  ?? A dry or stuffy nose and a sore throat.  ?? Irritated skin on the face.  ?? Sore eyes.  ?? Bloating.  If you have any of these problems, work with your doctor to fix them. Here are some things you can try:  ?? Be sure the mask or nasal prongs fit well.  ?? See if your doctor can adjust the pressure of your CPAP.  ?? If your nose is dry, try a humidifier.  ?? If your nose is runny or stuffy, try decongestant medicine or a steroid nasal spray. Be safe with medicines. Read and follow all instructions on the label. Do not use the medicine longer than the label says.  If these things do not help, you might try a different type of machine. Some machines have air pressure that adjusts on its own. Others have air pressures that are different when you breathe in than when you breathe out. This may reduce discomfort caused by too much pressure in your nose.   Where can you learn more?   Go to http://www.healthwise.net/[facility]  Enter X266 in the search box to learn more about "Learning About CPAP for Sleep Apnea."   ?? 2006-2014 Healthwise, Incorporated. Care instructions adapted under license by [place] (which disclaims liability or warranty for this information). This care instruction is for use with your licensed healthcare professional. If you have questions about a medical condition or this instruction, always ask your healthcare professional. Healthwise, Incorporated disclaims any warranty or liability for your use of this information.  Content Version: 10.1.311062; Current as of: July 30, 2012

## 2016-08-28 DIAGNOSIS — L639 Alopecia areata, unspecified: Secondary | ICD-10-CM | POA: Insufficient documentation

## 2016-09-08 ENCOUNTER — Ambulatory Visit: Admit: 2016-09-08 | Discharge: 2016-09-08 | Payer: BLUE CROSS/BLUE SHIELD | Primary: Family

## 2016-09-08 DIAGNOSIS — G4733 Obstructive sleep apnea (adult) (pediatric): Secondary | ICD-10-CM

## 2016-09-08 NOTE — Progress Notes (Signed)
[facility]  [address] [place] [address]  [phone]    Patient Name: Alexander Howell  Date of Birth: 11-08-1970    Cpap Clinic Visit:  The patient is being seen today in the Cpap Clinic after replacing PAP therapy.  He is prescribed autopap therapy with a humidifier set at 5-15cm with a nasal mask. The AHI is 1.0, leak 12 and the hourly usage is 7.2 nightly. The overall use is 175 hours with days greater than four hours at 25/28. The patient is compliant with and benefiting from the Pap therapy.    Physical Exam:    Visit Vitals   ??? BP 128/74   ??? Pulse 67   ??? Resp 14   ??? Ht 5' 8"  (1.727 m)   ??? Wt 209 lb (94.8 kg)   ??? SpO2 97%   ??? BMI 31.78 kg/m2       Encounter Diagnosis   Name Primary?   ??? OSA (obstructive sleep apnea) Yes         Patient Allergies:  Review of patient's allergies indicates no known allergies.     Current Outpatient Prescriptions   Medication Sig Dispense Refill   ??? cpap machine kit Needs new mask, hose, head gear, and filter for cpap therapy. 1 Kit 0   ??? cpap machine kit Product Unit Price Quantity Total   AirFit??? N20 Nasal CPAP Mask with Headgear - Medium $99.00 1 $99.00   Return Insurance For AirFit??? N20 Nasal CPAP Mask with Headgear $0.00 1 $0.00   White 6 Foot Performance 45m Tubing with 230mEasy Grip Cuffs $12.95 1 $12.95 1 Kit 0   ??? meloxicam (MOBIC) 15 mg tablet TAKE ONE TABLET BY MOUTH ONCE DAILY 30 Tab 0   ??? atorvastatin (LIPITOR) 40 mg tablet TAKE ONE TABLET BY MOUTH ONCE DAILY 90 Tab 3   ??? atorvastatin (LIPITOR) 40 mg tablet Take 1 Tab by mouth daily. 90 Tab 3   ??? lisinopril (PRINIVIL, ZESTRIL) 5 mg tablet Take 1 Tab by mouth daily. 90 Tab 3   ??? cyclobenzaprine (FLEXERIL) 10 mg tablet Take 1 Tab by mouth three (3) times daily as needed for Muscle Spasm(s). 90 Tab 1   ??? fluticasone (FLONASE) 50 mcg/actuation nasal spray 2 Sprays by Both Nostrils route daily. 3 Bottle 3        Plan:   The patient will continue to follow the prescribed therapy of 5-15cm and follow up in the sleep center in three months. The patient will follow up with their DME company as needed for supplies.    Supervising Physician:Dr. KuDespina Pole

## 2016-11-20 ENCOUNTER — Ambulatory Visit: Admit: 2016-11-20 | Discharge: 2016-11-20 | Payer: BLUE CROSS/BLUE SHIELD | Attending: Family | Primary: Family

## 2016-11-20 DIAGNOSIS — F419 Anxiety disorder, unspecified: Secondary | ICD-10-CM

## 2016-11-20 MED ORDER — ESCITALOPRAM 20 MG TAB
20 mg | ORAL_TABLET | Freq: Every day | ORAL | 1 refills | Status: DC
Start: 2016-11-20 — End: 2017-01-01

## 2016-11-20 NOTE — Progress Notes (Signed)
Chief Complaint   Patient presents with   ??? Anxiety     pt states anxiety has been pretty consistent but in the last 3 weeks has gotten worst.        Alexander Howell is a 46 y.o. male        HPI  Patient states that having a lot of stress.   States that having lots of anxiety, that has lead to a depression funk.  States that has been pretty intense of the last 2 weeks.  Just feels like can't shake it.  States that normally can manage, but right now it is to the 9th degree. Seeing a therapist and trying to work things out. somedays are just unbearable and not wanting to do anything.   Past Medical History:   Diagnosis Date   ??? Allergic rhinitis, cause unspecified    ??? Essential hypertension, benign    ??? Mixed hyperlipidemia    ??? Sleep apnea        Social History     Social History   ??? Marital status: MARRIED     Spouse name: N/A   ??? Number of children: N/A   ??? Years of education: N/A     Occupational History   ??? Not on file.     Social History Main Topics   ??? Smoking status: Never Smoker   ??? Smokeless tobacco: Never Used   ??? Alcohol use 0.0 oz/week     0 Standard drinks or equivalent per week      Comment: monthly or less; 1-2 drinks   ??? Drug use: Not on file   ??? Sexual activity: Not on file     Other Topics Concern   ??? Not on file     Social History Narrative       Family History   Problem Relation Age of Onset   ??? Hypertension Mother    ??? High Cholesterol Mother    ??? Hypertension Father    ??? High Cholesterol Father    ??? Diabetes Maternal Aunt    ??? Heart Disease Paternal Uncle    ??? Heart Disease Paternal Grandfather        Current Outpatient Prescriptions   Medication Sig Dispense Refill   ??? cpap machine kit Needs new mask, hose, head gear, and filter for cpap therapy. 1 Kit 0   ??? cpap machine kit Product Unit Price Quantity Total   AirFit??? N20 Nasal CPAP Mask with Headgear - Medium $99.00 1 $99.00   Return Insurance For AirFit??? N20 Nasal CPAP Mask with Headgear $0.00 1 $0.00    White 6 Foot Performance 10m Tubing with 225mEasy Grip Cuffs $12.95 1 $12.95 1 Kit 0   ??? meloxicam (MOBIC) 15 mg tablet TAKE ONE TABLET BY MOUTH ONCE DAILY 30 Tab 0   ??? atorvastatin (LIPITOR) 40 mg tablet TAKE ONE TABLET BY MOUTH ONCE DAILY 90 Tab 3   ??? lisinopril (PRINIVIL, ZESTRIL) 5 mg tablet Take 1 Tab by mouth daily. 90 Tab 3   ??? cyclobenzaprine (FLEXERIL) 10 mg tablet Take 1 Tab by mouth three (3) times daily as needed for Muscle Spasm(s). 90 Tab 1   ??? fluticasone (FLONASE) 50 mcg/actuation nasal spray 2 Sprays by Both Nostrils route daily. 3 Bottle 3         Review of Systems - History obtained from the patient  [facility] hpi      Visit Vitals   ??? BP 116/73 (BP 1 Location:  Left arm, BP Patient Position: Sitting)   ??? Pulse 72   ??? Resp 16   ??? Ht 5' 8"  (1.727 m)   ??? Wt 208 lb 3.2 oz (94.4 kg)   ??? BMI 31.66 kg/m2           Physical Examination: General appearance - alert, well appearing, and in no distress and oriented to person, place, and time  Mental status - alert, oriented to person, place, and time, normal mood, behavior, speech, dress, motor activity, and thought processes  Eyes - pupils equal and reactive, extraocular eye movements intact  Ears - bilateral TM's and external ear canals normal  Nose - normal and patent, no erythema, discharge or polyps  Mouth - mucous membranes moist, pharynx normal without lesions  Neck - supple, no significant adenopathy  Lymphatics - no palpable lymphadenopathy, no hepatosplenomegaly  Chest - clear to auscultation, no wheezes, rales or rhonchi, symmetric air entry  Heart - normal rate, regular rhythm, normal S1, S2, no murmurs, rubs, clicks or gallops  Abdomen - soft, nontender, nondistended, no masses or organomegaly  Neurological - alert, oriented, normal speech, no focal findings or movement disorder noted  Musculoskeletal - no joint tenderness, deformity or swelling  Skin - normal coloration and turgor, no rashes, no suspicious skin lesions noted     PHQ over the last two weeks 11/20/2016   Little interest or pleasure in doing things More than half the days   Feeling down, depressed or hopeless More than half the days   Total Score PHQ 2 4   Trouble falling or staying asleep, or sleeping too much Several days   Feeling tired or having little energy More than half the days   Poor appetite or overeating Not at all   Feeling bad about yourself - or that you are a failure or have let yourself or your family down Several days   Trouble concentrating on things such as school, work, reading or watching TV More than half the days   Moving or speaking so slowly that other people could have noticed; or the opposite being so fidgety that others notice Not at all   Thoughts of being better off dead, or hurting yourself in some way Several days   PHQ 9 Score 11   How difficult have these problems made it for you to do your work, take care of your home and get along with others Somewhat difficult         .  Assessment and plan:    1. Anxiety  Start lexapro 60m.  Use 174mfirst 4 weeks, then increase if needed. Keep with therapist. F/u in 6 weeks.     2. Depression, unspecified depression type

## 2016-11-26 ENCOUNTER — Encounter: Primary: Family

## 2017-01-01 ENCOUNTER — Ambulatory Visit: Admit: 2017-01-01 | Discharge: 2017-01-01 | Payer: BLUE CROSS/BLUE SHIELD | Attending: Family | Primary: Family

## 2017-01-01 DIAGNOSIS — F419 Anxiety disorder, unspecified: Secondary | ICD-10-CM

## 2017-01-01 MED ORDER — ESCITALOPRAM 20 MG TAB
20 mg | ORAL_TABLET | Freq: Every day | ORAL | 3 refills | Status: DC
Start: 2017-01-01 — End: 2017-06-12

## 2017-01-01 NOTE — Progress Notes (Signed)
Chief Complaint   Patient presents with   ??? Follow-up     6 week f/u on Alexander Howell is a 46 y.o. male        HPI  Here for 6 week f/u on medication.  States that last 3 weeks have been much better. Mainly stomach related from the lexapro in the first 2-3 months.  States that stomach had pain first 2 weeks, then went away.    Then started on full pill 2 weeks, and stomach pain came back, and then headache right at end of day.  States that around 6pm would get a headache.  Stomach  Has some tension.    States that anxiety is a lot better, medication seems to have helped to him a lot.   Past Medical History:   Diagnosis Date   ??? Allergic rhinitis, cause unspecified    ??? Essential hypertension, benign    ??? Mixed hyperlipidemia    ??? Sleep apnea        Social History     Social History   ??? Marital status: MARRIED     Spouse name: N/A   ??? Number of children: N/A   ??? Years of education: N/A     Occupational History   ??? Not on file.     Social History Main Topics   ??? Smoking status: Never Smoker   ??? Smokeless tobacco: Never Used   ??? Alcohol use 0.0 oz/week     0 Standard drinks or equivalent per week      Comment: monthly or less; 1-2 drinks   ??? Drug use: Not on file   ??? Sexual activity: Not on file     Other Topics Concern   ??? Not on file     Social History Narrative       Family History   Problem Relation Age of Onset   ??? Hypertension Mother    ??? High Cholesterol Mother    ??? Hypertension Father    ??? High Cholesterol Father    ??? Diabetes Maternal Aunt    ??? Heart Disease Paternal Uncle    ??? Heart Disease Paternal Grandfather        Current Outpatient Prescriptions   Medication Sig Dispense Refill   ??? escitalopram oxalate (LEXAPRO) 20 mg tablet Take 1 Tab by mouth daily. 90 Tab 1   ??? cpap machine kit Needs new mask, hose, head gear, and filter for cpap therapy. 1 Kit 0   ??? cpap machine kit Product Unit Price Quantity Total   AirFit??? N20 Nasal CPAP Mask with Headgear - Medium $99.00 1 $99.00    Return Insurance For AirFit??? N20 Nasal CPAP Mask with Headgear $0.00 1 $0.00   White 6 Foot Performance 40m Tubing with 262mEasy Grip Cuffs $12.95 1 $12.95 1 Kit 0   ??? meloxicam (MOBIC) 15 mg tablet TAKE ONE TABLET BY MOUTH ONCE DAILY 30 Tab 0   ??? atorvastatin (LIPITOR) 40 mg tablet TAKE ONE TABLET BY MOUTH ONCE DAILY 90 Tab 3   ??? lisinopril (PRINIVIL, ZESTRIL) 5 mg tablet Take 1 Tab by mouth daily. 90 Tab 3   ??? cyclobenzaprine (FLEXERIL) 10 mg tablet Take 1 Tab by mouth three (3) times daily as needed for Muscle Spasm(s). 90 Tab 1   ??? fluticasone (FLONASE) 50 mcg/actuation nasal spray 2 Sprays by Both Nostrils route daily. 3 Bottle 3         Review  of Systems - History obtained from the patient  Ros- see hpi    Visit Vitals   ??? BP 137/83 (BP 1 Location: Right arm, BP Patient Position: At rest)   ??? Pulse 60   ??? Ht 5' 8"  (1.727 m)   ??? Wt 208 lb 9.6 oz (94.6 kg)   ??? SpO2 98%   ??? BMI 31.72 kg/m2           Physical Examination: General appearance - alert, well appearing, and in no distress and oriented to person, place, and time  Mental status - alert, oriented to person, place, and time, normal mood, behavior, speech, dress, motor activity, and thought processes  Eyes - pupils equal and reactive, extraocular eye movements intact  Ears - bilateral TM's and external ear canals normal  Nose - normal and patent, no erythema, discharge or polyps  Mouth - mucous membranes moist, pharynx normal without lesions  Neck - supple, no significant adenopathy  Lymphatics - no palpable lymphadenopathy, no hepatosplenomegaly  Chest - clear to auscultation, no wheezes, rales or rhonchi, symmetric air entry  Heart - normal rate, regular rhythm, normal S1, S2, no murmurs, rubs, clicks or gallops  Abdomen - soft, nontender, nondistended, no masses or organomegaly  Neurological - alert, oriented, normal speech, no focal findings or movement disorder noted  Musculoskeletal - no joint tenderness, deformity or swelling   Skin - normal coloration and turgor, no rashes, no suspicious skin lesions noted        .  Assessment and plan:      1. Anxiety  Will keep using lexapro, can cut back to 74m if side effects persit, but doing better.

## 2017-02-19 ENCOUNTER — Encounter

## 2017-02-19 NOTE — Telephone Encounter (Signed)
From: Eustace PenJames Douglas Strauch  To: Earney HamburgMatthew T Keaton, NP  Sent: 02/19/2017 3:06 PM EDT  Subject:  Referral Request    [facility] requires a referral from your office to get the process going.

## 2017-02-25 NOTE — Addendum Note (Signed)
Addended by: Sophronia Simas T on: 02/25/2017 10:01 AM      Modules accepted: Orders

## 2017-05-14 ENCOUNTER — Ambulatory Visit: Admit: 2017-05-14 | Discharge: 2017-05-14 | Payer: BLUE CROSS/BLUE SHIELD | Attending: Family | Primary: Family

## 2017-05-14 DIAGNOSIS — F419 Anxiety disorder, unspecified: Secondary | ICD-10-CM

## 2017-05-14 MED ORDER — CLONAZEPAM 1 MG TAB
1 mg | ORAL_TABLET | Freq: Two times a day (BID) | ORAL | 0 refills | Status: DC | PRN
Start: 2017-05-14 — End: 2017-12-03

## 2017-05-14 NOTE — Progress Notes (Signed)
Chief Complaint   Patient presents with   ??? Follow-up     pt states he is struggling with depression behind what lexapro has helped with. Daughter is in rehab and just got confirmation today that his wife is having an affair.        Alexander Howell is a 46 y.o. male        HPI  States that currently anxiety and depression.  Currently his daughter is going through 2nd rehab program.    States that his wife is having an affair, and he is keeping it bottled up.  States that he feels like has to keep it hidden to protect his income.  States that its pilling up, and bottling that all back up.  He is currently seeing a therapist.   States that has thought about suicide in the past 4 weeks.  States that seeing therapist about twice a week. Feels the intensity has gotten into a funk.   States that still goes to the gym.       Past Medical History:   Diagnosis Date   ??? Allergic rhinitis, cause unspecified    ??? Essential hypertension, benign    ??? Mixed hyperlipidemia    ??? Sleep apnea        Social History     Socioeconomic History   ??? Marital status: MARRIED     Spouse name: Not on file   ??? Number of children: Not on file   ??? Years of education: Not on file   ??? Highest education level: Not on file   Social Needs   ??? Financial resource strain: Not on file   ??? Food insecurity - worry: Not on file   ??? Food insecurity - inability: Not on file   ??? Transportation needs - medical: Not on file   ??? Transportation needs - non-medical: Not on file   Occupational History   ??? Not on file   Tobacco Use   ??? Smoking status: Never Smoker   ??? Smokeless tobacco: Never Used   Substance and Sexual Activity   ??? Alcohol use: Yes     Alcohol/week: 0.0 oz     Comment: monthly or less; 1-2 drinks   ??? Drug use: Not on file   ??? Sexual activity: Not on file   Other Topics Concern   ??? Not on file   Social History Narrative   ??? Not on file       Family History   Problem Relation Age of Onset   ??? Hypertension Mother    ??? High Cholesterol Mother     ??? Hypertension Father    ??? High Cholesterol Father    ??? Diabetes Maternal Aunt    ??? Heart Disease Paternal Uncle    ??? Heart Disease Paternal Grandfather        Current Outpatient Medications   Medication Sig Dispense Refill   ??? escitalopram oxalate (LEXAPRO) 20 mg tablet Take 1 Tab by mouth daily. 90 Tab 3   ??? cpap machine kit Needs new mask, hose, head gear, and filter for cpap therapy. 1 Kit 0   ??? cpap machine kit Product Unit Price Quantity Total   AirFit??? N20 Nasal CPAP Mask with Headgear - Medium $99.00 1 $99.00   Return Insurance For AirFit??? N20 Nasal CPAP Mask with Headgear $0.00 1 $0.00   White 6 Foot Performance 72m Tubing with 217mEasy Grip Cuffs $12.95 1 $12.95 1 Kit 0   ??? meloxicam (  MOBIC) 15 mg tablet TAKE ONE TABLET BY MOUTH ONCE DAILY 30 Tab 0   ??? atorvastatin (LIPITOR) 40 mg tablet TAKE ONE TABLET BY MOUTH ONCE DAILY 90 Tab 3   ??? lisinopril (PRINIVIL, ZESTRIL) 5 mg tablet Take 1 Tab by mouth daily. 90 Tab 3   ??? cyclobenzaprine (FLEXERIL) 10 mg tablet Take 1 Tab by mouth three (3) times daily as needed for Muscle Spasm(s). 90 Tab 1   ??? fluticasone (FLONASE) 50 mcg/actuation nasal spray 2 Sprays by Both Nostrils route daily. 3 Bottle 3     Ros- see hpi      Visit Vitals  BP (!) 144/92   Pulse 88   Temp 98.5 ??F (36.9 ??C) (Tympanic)   Ht 5' 8"  (1.727 m)   Wt 212 lb (96.2 kg)   BMI 32.23 kg/m??           Physical Examination: General appearance - alert, well appearing, and in no distress and oriented to person, place, and time  Mental status - alert, oriented to person, place, and time, appears anxious.    behavior, speech, dress, motor activity, and thought processes  Eyes - pupils equal and reactive, extraocular eye movements intact  Ears - bilateral TM's and external ear canals normal  Nose - normal and patent, no erythema, discharge or polyps  Mouth - mucous membranes moist, pharynx normal without lesions  Neck - supple, no significant adenopathy   Lymphatics - no palpable lymphadenopathy, no hepatosplenomegaly  Chest - clear to auscultation, no wheezes, rales or rhonchi, symmetric air entry  Heart - normal rate, regular rhythm, normal S1, S2, no murmurs, rubs, clicks or gallops  Abdomen - soft, nontender, nondistended, no masses or organomegaly  Neurological - alert, oriented, normal speech, no focal findings or movement disorder noted  Musculoskeletal - no joint tenderness, deformity or swelling  Skin - normal coloration and turgor, no rashes, no suspicious skin lesions noted        .  Assessment and plan:    1. Anxiety  Keep on kexapro.   Patient is seeing therapist twice per week, told to let me know if any suicidal thoughts, and told of emergent numbers If needed.   We are going to add klonopin 6m told to use 1/2 at start, and another 1/2 20 minute later. He states understanding.       2. Depression, unspecified depression type

## 2017-06-05 ENCOUNTER — Ambulatory Visit: Admit: 2017-06-05 | Discharge: 2017-06-05 | Payer: BLUE CROSS/BLUE SHIELD | Attending: Family | Primary: Family

## 2017-06-05 DIAGNOSIS — Z0000 Encounter for general adult medical examination without abnormal findings: Secondary | ICD-10-CM

## 2017-06-05 LAB — AMB POC URINALYSIS DIP STICK MANUAL W/ MICRO
Amorphous Crystals: 0
Bilirubin (UA POC): NEGATIVE
Blood (UA POC): NEGATIVE
Casts: 0 [HPF] (ref 0–1)
Crystals (UA POC): NEGATIVE
Glucose (UA POC): NEGATIVE
Ketones (UA POC): NEGATIVE
Leukocyte esterase (UA POC): NEGATIVE
Nitrites (UA POC): NEGATIVE
Other:: 0
Protein (UA POC): NEGATIVE
RBC: 0 [HPF] (ref 0–3)
Specific gravity (UA POC): 1.03 (ref 1.003–1.035)
Urobilinogen (UA POC): 0.2 (ref 0.2–1)
pH (UA POC): 5.5 (ref 4.6–8.0)

## 2017-06-05 MED ORDER — BUSPIRONE 15 MG TAB
15 mg | ORAL_TABLET | Freq: Two times a day (BID) | ORAL | 3 refills | Status: DC
Start: 2017-06-05 — End: 2017-10-09

## 2017-06-05 NOTE — Progress Notes (Signed)
Cholesterol is great.   Thyroid normal  Glucose, kidney, electrolytes are good. Liver is ok.   Blood counts normal  Prostate normal

## 2017-06-05 NOTE — Progress Notes (Signed)
Chief Complaint   Patient presents with   ??? Complete Physical     fasting        Alexander Howell is a 46 y.o. male   who is here for a physical.  States diet has been balanced including fruits, vegetables and proteins. Patient is  exercising regularly.  Reports occasional alcohol use and does not  use tobacco products.  Does see the dentist regularly and has kept up with opthalmologic exams as appropriate.  When patient is out in sun for extended periods of time sun block is not used. Reports stable mood.  ROS below for purpose of exam today .     Past Medical History:   Diagnosis Date   ??? Allergic rhinitis, cause unspecified    ??? Essential hypertension, benign    ??? Mixed hyperlipidemia    ??? Sleep apnea        Current Outpatient Medications   Medication Sig Dispense Refill   ??? clonazePAM (KLONOPIN) 1 mg tablet Take 1 Tab by mouth two (2) times daily as needed. Max Daily Amount: 2 mg. 30 Tab 0   ??? escitalopram oxalate (LEXAPRO) 20 mg tablet Take 1 Tab by mouth daily. 90 Tab 3   ??? cpap machine kit Needs new mask, hose, head gear, and filter for cpap therapy. 1 Kit 0   ??? cpap machine kit Product Unit Price Quantity Total   AirFit??? N20 Nasal CPAP Mask with Headgear - Medium $99.00 1 $99.00   Return Insurance For AirFit??? N20 Nasal CPAP Mask with Headgear $0.00 1 $0.00   White 6 Foot Performance 3m Tubing with 256mEasy Grip Cuffs $12.95 1 $12.95 1 Kit 0   ??? meloxicam (MOBIC) 15 mg tablet TAKE ONE TABLET BY MOUTH ONCE DAILY 30 Tab 0   ??? atorvastatin (LIPITOR) 40 mg tablet TAKE ONE TABLET BY MOUTH ONCE DAILY 90 Tab 3   ??? lisinopril (PRINIVIL, ZESTRIL) 5 mg tablet Take 1 Tab by mouth daily. 90 Tab 3   ??? cyclobenzaprine (FLEXERIL) 10 mg tablet Take 1 Tab by mouth three (3) times daily as needed for Muscle Spasm(s). 90 Tab 1   ??? fluticasone (FLONASE) 50 mcg/actuation nasal spray 2 Sprays by Both Nostrils route daily. 3 Bottle 3       Past Surgical History:   Procedure Laterality Date   ??? HX OTHER SURGICAL  09/2016     dental implant       Family History   Problem Relation Age of Onset   ??? Hypertension Mother    ??? High Cholesterol Mother    ??? Hypertension Father    ??? High Cholesterol Father    ??? Diabetes Maternal Aunt    ??? Heart Disease Paternal Uncle    ??? Heart Disease Paternal Grandfather        No Known Allergies    Social History     Socioeconomic History   ??? Marital status: MARRIED     Spouse name: Not on file   ??? Number of children: Not on file   ??? Years of education: Not on file   ??? Highest education level: Not on file   Social Needs   ??? Financial resource strain: Not on file   ??? Food insecurity - worry: Not on file   ??? Food insecurity - inability: Not on file   ??? Transportation needs - medical: Not on file   ??? Transportation needs - non-medical: Not on file   Occupational History   ???  Not on file   Tobacco Use   ??? Smoking status: Never Smoker   ??? Smokeless tobacco: Never Used   Substance and Sexual Activity   ??? Alcohol use: Yes     Alcohol/week: 0.0 oz     Comment: monthly or less; 1-2 drinks   ??? Drug use: Not on file   ??? Sexual activity: Not on file   Other Topics Concern   ??? Not on file   Social History Narrative   ??? Not on file       Review of Systems - General ROS: negative for - chills, fatigue, fever, malaise, night sweats, weight gain or weight loss  Ophthalmic ROS: negative for - decreased vision, eye pain or loss of vision  ENT ROS: negative for - epistaxis, headaches, hearing change, nasal congestion, sinus pain, sore throat or tinnitus  Allergy and Immunology ROS: negative for - hives, itchy/watery eyes, nasal congestion or seasonal allergies  Hematological and Lymphatic ROS: negative for - bleeding problems, bruising, night sweats, swollen lymph nodes or weight loss  Endocrine ROS: negative for - breast changes, galactorrhea, hot flashes, mood swings, polydipsia/polyuria or unexpected weight changes  Breast ROS: negative for - changes  Respiratory ROS: negative for - cough, orthopnea, shortness of breath,  tachypnea or wheezing  Cardiovascular ROS: negative for - chest pain, dyspnea on exertion, edema, irregular heartbeat, loss of consciousness, orthopnea, palpitations, paroxysmal nocturnal dyspnea, rapid heart rate or shortness of breath  Gastrointestinal ROS: negative for - abdominal pain, appetite loss, blood in stools, change in bowel habits, change in stools, constipation, diarrhea or heartburn  Genito-Urinary ROS: negative for - urinary hesitancy, urinary frequency, weak urinary stream, testicle lumps or pain, penile discharge  Musculoskeletal ROS: negative for - gait disturbance, joint pain, joint stiffness, joint swelling or muscular weakness  Neurological ROS: negative for - gait disturbance, headaches, impaired coordination/balance, memory loss, numbness/tingling, seizures, tremors or weakness  Dermatological ROS: negative for - dry skin, eczema, hair changes, mole changes, pruritus or rash        Vitals:    06/05/17 0819   BP: 122/72   Pulse: 63   Temp: 97 ??F (36.1 ??C)   TempSrc: Tympanic   Weight: 212 lb 3.2 oz (96.3 kg)   Height: 5' 8" (1.727 m)         Physical Examination: General appearance - alert, well appearing, and in no distress, oriented to person, place, and time, normal appearing weight and acyanotic, in no respiratory distress  Mental status - alert, oriented to person, place, and time, normal mood, behavior, speech, dress, motor activity, and thought processes  Eyes - pupils equal and reactive, extraocular eye movements intact, sclera anicteric  Ears - bilateral TM's and external ear canals normal  Nose - normal and patent, no erythema, discharge or polyps  Mouth - mucous membranes moist, pharynx normal without lesions, tonsils normal, dental hygiene good and tongue normal  Neck - supple, no significant adenopathy, carotids upstroke normal bilaterally, no bruits,   Thyroid -  thyroid is normal in size without nodules or tenderness   Lymphatics - no palpable lymphadenopathy, no hepatosplenomegaly  Chest - clear to auscultation, no wheezes, rales or rhonchi, symmetric air entry  Heart - normal rate, regular rhythm, normal S1, S2, no murmurs, rubs, clicks or gallops  Abdomen - soft, nontender, nondistended, no masses or organomegaly  GU - Normal genitalia, no testicular masses or hernias, Prostate Normal  Back exam - full range of motion, no tenderness, palpable spasm  or pain on motion  Neurological - alert, oriented, normal speech, no focal findings or movement disorder noted  Musculoskeletal - no joint tenderness, deformity or swelling  Extremities - peripheral pulses normal, no pedal edema, no clubbing or cyanosis  Skin - there is 1 skin tag on the left groin.       Assessment/plan  Wellness visit- Overall appears to be in good physical condition.  Routine screening labs obtained and will be reviewed upon return. Encouraged to start or maintain healthy diet and or routine exercise. Reviewed and discussed appropriate vaccines and screenings and administered needed vaccines or referrals for screening.  Reviewed safety issues common for age bracket. Fecal occult blood cards were  provided to patient for completion with explanation for their use.    1. Routine general medical examination at a health care facility    - AMB POC URINALYSIS DIP STICK MANUAL W/ MICRO  - LIPID PANEL  - TSH 3RD GENERATION  - METABOLIC PANEL, COMPREHENSIVE  - CBC WITH AUTOMATED DIFF  - PSA, DIAGNOSTIC (PROSTATE SPECIFIC AG)    2. Prostate cancer screening    - PSA, DIAGNOSTIC (PROSTATE SPECIFIC AG)    3. Skin tag  Using ethers, we removed 1 skin tag from the left groin.  Using scissors and tweezers to remove. Cleaned well with alcohol, patient tolerated well.         4. Anxiety  Will keep on lexapro, add on buspar 7.10m bid then up to 15.     MLoretha Brasil NP

## 2017-06-06 LAB — METABOLIC PANEL, COMPREHENSIVE
A-G Ratio: 1.9 (ref 1.2–2.2)
ALT (SGPT): 46 IU/L — ABNORMAL HIGH (ref 0–44)
AST (SGOT): 37 IU/L (ref 0–40)
Albumin: 4.4 g/dL (ref 3.5–5.5)
Alk. phosphatase: 56 IU/L (ref 39–117)
BUN/Creatinine ratio: 17 (ref 9–20)
BUN: 17 mg/dL (ref 6–24)
Bilirubin, total: 1.1 mg/dL (ref 0.0–1.2)
CO2: 25 mmol/L (ref 20–29)
Calcium: 9.1 mg/dL (ref 8.7–10.2)
Chloride: 100 mmol/L (ref 96–106)
Creatinine: 1.01 mg/dL (ref 0.76–1.27)
GFR est AA: 101 mL/min/{1.73_m2} (ref >59)
GFR est non-AA: 88 mL/min/{1.73_m2} (ref >59)
GLOBULIN, TOTAL: 2.3 g/dL (ref 1.5–4.5)
Glucose: 89 mg/dL (ref 65–99)
Potassium: 4.7 mmol/L (ref 3.5–5.2)
Protein, total: 6.7 g/dL (ref 6.0–8.5)
Sodium: 138 mmol/L (ref 134–144)

## 2017-06-06 LAB — CBC WITH AUTOMATED DIFF
ABS. BASOPHILS: 0 10*3/uL (ref 0.0–0.2)
ABS. EOSINOPHILS: 0.2 10*3/uL (ref 0.0–0.4)
ABS. IMM. GRANS.: 0 10*3/uL (ref 0.0–0.1)
ABS. MONOCYTES: 0.8 10*3/uL (ref 0.1–0.9)
ABS. NEUTROPHILS: 2 10*3/uL (ref 1.4–7.0)
Abs Lymphocytes: 1.9 10*3/uL (ref 0.7–3.1)
BASOPHILS: 1 %
EOSINOPHILS: 3 %
HCT: 42.7 % (ref 37.5–51.0)
HGB: 15 g/dL (ref 13.0–17.7)
IMMATURE GRANULOCYTES: 0 %
Lymphocytes: 39 %
MCH: 31.3 pg (ref 26.6–33.0)
MCHC: 35.1 g/dL (ref 31.5–35.7)
MCV: 89 fL (ref 79–97)
MONOCYTES: 17 %
NEUTROPHILS: 40 %
PLATELET: 215 10*3/uL (ref 150–379)
RBC: 4.79 x10E6/uL (ref 4.14–5.80)
RDW: 13.6 % (ref 12.3–15.4)
WBC: 4.9 10*3/uL (ref 3.4–10.8)

## 2017-06-06 LAB — PSA, DIAGNOSTIC (PROSTATE SPECIFIC AG): Prostate Specific Ag: 0.2 ng/mL (ref 0.0–4.0)

## 2017-06-06 LAB — LIPID PANEL
Cholesterol, total: 136 mg/dL (ref 100–199)
HDL Cholesterol: 45 mg/dL (ref >39)
LDL, calculated: 64 mg/dL (ref 0–99)
Triglyceride: 136 mg/dL (ref 0–149)
VLDL, calculated: 27 mg/dL (ref 5–40)

## 2017-06-06 LAB — TSH 3RD GENERATION: TSH: 1.36 u[IU]/mL (ref 0.450–4.500)

## 2017-06-12 ENCOUNTER — Encounter

## 2017-06-12 MED ORDER — ESCITALOPRAM 20 MG TAB
20 mg | ORAL_TABLET | Freq: Every day | ORAL | 3 refills | Status: DC
Start: 2017-06-12 — End: 2018-04-01

## 2017-06-15 MED ORDER — LISINOPRIL 5 MG TAB
5 mg | ORAL_TABLET | Freq: Every day | ORAL | 3 refills | Status: DC
Start: 2017-06-15 — End: 2018-07-08

## 2017-06-15 MED ORDER — ATORVASTATIN 40 MG TAB
40 mg | ORAL_TABLET | Freq: Every day | ORAL | 3 refills | Status: DC
Start: 2017-06-15 — End: 2018-07-08

## 2017-10-09 ENCOUNTER — Ambulatory Visit: Admit: 2017-10-09 | Discharge: 2017-10-09 | Payer: BLUE CROSS/BLUE SHIELD | Attending: Family | Primary: Family

## 2017-10-09 DIAGNOSIS — F32A Depression, unspecified: Secondary | ICD-10-CM

## 2017-10-09 MED ORDER — ESCITALOPRAM 10 MG TAB
10 mg | ORAL_TABLET | Freq: Every day | ORAL | 0 refills | Status: DC
Start: 2017-10-09 — End: 2018-04-01

## 2017-10-09 MED ORDER — VALACYCLOVIR 500 MG TAB
500 mg | ORAL_TABLET | Freq: Every day | ORAL | 3 refills | Status: DC
Start: 2017-10-09 — End: 2018-09-30

## 2017-10-09 NOTE — Addendum Note (Signed)
Addended by: Sophronia SimasKEATON, Magaby Rumberger T on: 10/09/2017 11:56 AM     Modules accepted: Orders

## 2017-10-09 NOTE — Progress Notes (Signed)
Neg. For hiv, gonorrhaeae and chlamydia, neg. For hep c, neg. For syphillis.   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

## 2017-10-09 NOTE — Progress Notes (Addendum)
Chief Complaint   Patient presents with   ??? Medication Refill     MED REFILL        Alexander Howell is a 47 y.o. male        HPI  Here for medication refill. States that currently going through a divorce.  States that separated for about 2 months.    He was put on lexapro in may of 2018.  Denies issues. Would like to wean off, and feels anxiety levels are much decreased      Past Medical History:   Diagnosis Date   ??? Allergic rhinitis, cause unspecified    ??? Essential hypertension, benign    ??? Mixed hyperlipidemia    ??? Sleep apnea        Past Surgical History:   Procedure Laterality Date   ??? HX OTHER SURGICAL  09/2016    dental implant         Social History     Socioeconomic History   ??? Marital status: MARRIED     Spouse name: Not on file   ??? Number of children: Not on file   ??? Years of education: Not on file   ??? Highest education level: Not on file   Occupational History   ??? Not on file   Social Needs   ??? Financial resource strain: Not on file   ??? Food insecurity:     Worry: Not on file     Inability: Not on file   ??? Transportation needs:     Medical: Not on file     Non-medical: Not on file   Tobacco Use   ??? Smoking status: Never Smoker   ??? Smokeless tobacco: Never Used   Substance and Sexual Activity   ??? Alcohol use: Yes     Alcohol/week: 0.0 oz     Comment: monthly or less; 1-2 drinks   ??? Drug use: Not on file   ??? Sexual activity: Not on file   Lifestyle   ??? Physical activity:     Days per week: Not on file     Minutes per session: Not on file   ??? Stress: Not on file   Relationships   ??? Social connections:     Talks on phone: Not on file     Gets together: Not on file     Attends religious service: Not on file     Active member of club or organization: Not on file     Attends meetings of clubs or organizations: Not on file     Relationship status: Not on file   ??? Intimate partner violence:     Fear of current or ex partner: Not on file     Emotionally abused: Not on file     Physically abused: Not on file      Forced sexual activity: Not on file   Other Topics Concern   ??? Not on file   Social History Narrative   ??? Not on file       Family History   Problem Relation Age of Onset   ??? Hypertension Mother    ??? High Cholesterol Mother    ??? Hypertension Father    ??? High Cholesterol Father    ??? Diabetes Maternal Aunt    ??? Heart Disease Paternal Uncle    ??? Heart Disease Paternal Grandfather        Current Outpatient Medications   Medication Sig Dispense Refill   ??? atorvastatin (  LIPITOR) 40 mg tablet Take 1 Tab by mouth daily. TAKE ONE TABLET BY MOUTH ONCE DAILY 90 Tab 3   ??? lisinopril (PRINIVIL, ZESTRIL) 5 mg tablet Take 1 Tab by mouth daily. 90 Tab 3   ??? escitalopram oxalate (LEXAPRO) 20 mg tablet Take 1 Tab by mouth daily. 90 Tab 3   ??? clonazePAM (KLONOPIN) 1 mg tablet Take 1 Tab by mouth two (2) times daily as needed. Max Daily Amount: 2 mg. 30 Tab 0   ??? cpap machine kit Needs new mask, hose, head gear, and filter for cpap therapy. 1 Kit 0   ??? cpap machine kit Product Unit Price Quantity Total   AirFit??? N20 Nasal CPAP Mask with Headgear - Medium $99.00 1 $99.00   Return Insurance For AirFit??? N20 Nasal CPAP Mask with Headgear $0.00 1 $0.00   White 6 Foot Performance 40m Tubing with 226mEasy Grip Cuffs $12.95 1 $12.95 1 Kit 0   ??? meloxicam (MOBIC) 15 mg tablet TAKE ONE TABLET BY MOUTH ONCE DAILY 30 Tab 0   ??? cyclobenzaprine (FLEXERIL) 10 mg tablet Take 1 Tab by mouth three (3) times daily as needed for Muscle Spasm(s). 90 Tab 1   ??? fluticasone (FLONASE) 50 mcg/actuation nasal spray 2 Sprays by Both Nostrils route daily. 3 Bottle 3         Review of Systems - General ROS: negative  Psychological ROS: negative  ENT ROS: negative  Respiratory ROS: no cough, shortness of breath, or wheezing  Cardiovascular ROS: no chest pain or dyspnea on exertion  Genito-Urinary ROS: positive for - erectile dysfunction  negative for - genital discharge, genital ulcers or hematuria        Visit Vitals  BP 124/81   Pulse 82    Temp 97.2 ??F (36.2 ??C)   Resp 16   Ht 5' 7.5" (1.715 m)   Wt 207 lb 12.8 oz (94.3 kg)   SpO2 98%   BMI 32.07 kg/m??           Physical Examination: General appearance - alert, well appearing, and in no distress and oriented to person, place, and time  Mental status - alert, oriented to person, place, and time, normal mood, behavior, speech, dress, motor activity, and thought processes  Eyes - pupils equal and reactive, extraocular eye movements intact  Ears - bilateral TM's and external ear canals normal  Nose - normal and patent, no erythema, discharge or polyps  Mouth - mucous membranes moist, pharynx normal without lesions  Neck - supple, no significant adenopathy  Lymphatics - no palpable lymphadenopathy, no hepatosplenomegaly  Chest - clear to auscultation, no wheezes, rales or rhonchi, symmetric air entry  Heart - normal rate, regular rhythm, normal S1, S2, no murmurs, rubs, clicks or gallops  Abdomen - soft, nontender, nondistended, no masses or organomegaly  Neurological - alert, oriented, normal speech, no focal findings or movement disorder noted  Musculoskeletal - no joint tenderness, deformity or swelling  Skin - normal coloration and turgor, no rashes, no suspicious skin lesions noted        .  Assessment and plan:        1. Depression, unspecified depression type  To wean have him do 1/2 tablet daily x 2 weeks. Then 1/2 tablet every other day x 2 weeks, then stop.       2. Anxiety    3. Screen for std.   4. Will use valtrex to help suppress. Instructed on use.

## 2017-10-10 LAB — RPR
RPR: NONREACTIVE
RPR: NONREACTIVE

## 2017-10-11 LAB — CHLAMYDIA/GC PCR
Chlamydia trachomatis, NAA: NEGATIVE
Neisseria gonorrhoeae, NAA: NEGATIVE

## 2017-10-13 ENCOUNTER — Encounter: Attending: Family | Primary: Family

## 2017-10-13 LAB — HIV 1/2 AG/AB, 4TH GENERATION,W RFLX CONFIRM: HIV SCREEN 4TH GENERATION WRFX: NONREACTIVE

## 2017-10-13 LAB — HEPATITIS C AB: Hep C Virus Ab: <0.1 s/co ratio (ref 0.0–0.9)

## 2017-10-13 LAB — HSV 1/2 AB, IGG/IGM
HSV 1 Ab, IgG, type spec.: 42.5 index — ABNORMAL HIGH (ref 0.00–0.90)
HSV 2 Ab IgG, type spec.: 15.9 index — ABNORMAL HIGH (ref 0.00–0.90)
HSV I/II Ab, IgM: <0.91 Ratio (ref 0.00–0.90)

## 2017-10-13 LAB — HEPATITIS C ANTIBODY: HCV Ab: <0.1 s/co ratio (ref 0.0–0.9)

## 2017-10-13 LAB — HIV 1/2 ANTIGEN/ANTIBODY, FOURTH GENERATION W/RFL: HIV Screen 4th Generation wRfx: NONREACTIVE

## 2017-12-03 ENCOUNTER — Ambulatory Visit: Attending: Family | Primary: Family

## 2017-12-03 ENCOUNTER — Ambulatory Visit: Admit: 2017-12-03 | Discharge: 2017-12-03 | Payer: BLUE CROSS/BLUE SHIELD | Attending: Family | Primary: Family

## 2017-12-03 DIAGNOSIS — I10 Essential (primary) hypertension: Secondary | ICD-10-CM

## 2017-12-03 NOTE — Progress Notes (Signed)
No additional comment

## 2017-12-03 NOTE — Progress Notes (Signed)
Chief Complaint   Patient presents with   ??? Hypertension     follow up       HPI:  Alexander Howell is a 48 y.o. male who has a history of hyperlipidemia and hypertension.  Patient reports dietary and exercise compliance.  Patient is  on medications.  Denies any significant side effects.  Patient has  had recent fasting labs.         Current Outpatient Medications   Medication Sig Dispense Refill   ??? valACYclovir (VALTREX) 500 mg tablet Take 1 Tab by mouth daily. 90 Tab 3   ??? escitalopram oxalate (LEXAPRO) 10 mg tablet Take 1 Tab by mouth daily. 10 Tab 0   ??? atorvastatin (LIPITOR) 40 mg tablet Take 1 Tab by mouth daily. TAKE ONE TABLET BY MOUTH ONCE DAILY 90 Tab 3   ??? lisinopril (PRINIVIL, ZESTRIL) 5 mg tablet Take 1 Tab by mouth daily. 90 Tab 3   ??? escitalopram oxalate (LEXAPRO) 20 mg tablet Take 1 Tab by mouth daily. 90 Tab 3   ??? cpap machine kit Needs new mask, hose, head gear, and filter for cpap therapy. 1 Kit 0   ??? cpap machine kit Product Unit Price Quantity Total   AirFit??? N20 Nasal CPAP Mask with Headgear - Medium $99.00 1 $99.00   Return Insurance For AirFit??? N20 Nasal CPAP Mask with Headgear $0.00 1 $0.00   White 6 Foot Performance 66m Tubing with 210mEasy Grip Cuffs $12.95 1 $12.95 1 Kit 0   ??? meloxicam (MOBIC) 15 mg tablet TAKE ONE TABLET BY MOUTH ONCE DAILY 30 Tab 0   ??? cyclobenzaprine (FLEXERIL) 10 mg tablet Take 1 Tab by mouth three (3) times daily as needed for Muscle Spasm(s). 90 Tab 1   ??? fluticasone (FLONASE) 50 mcg/actuation nasal spray 2 Sprays by Both Nostrils route daily. 3 Bottle 3         Social History     Socioeconomic History   ??? Marital status: MARRIED     Spouse name: Not on file   ??? Number of children: Not on file   ??? Years of education: Not on file   ??? Highest education level: Not on file   Occupational History   ??? Not on file   Social Needs   ??? Financial resource strain: Not on file   ??? Food insecurity:     Worry: Not on file     Inability: Not on file   ??? Transportation needs:      Medical: Not on file     Non-medical: Not on file   Tobacco Use   ??? Smoking status: Never Smoker   ??? Smokeless tobacco: Never Used   Substance and Sexual Activity   ??? Alcohol use: Yes     Alcohol/week: 0.0 oz     Comment: monthly or less; 1-2 drinks   ??? Drug use: Not on file   ??? Sexual activity: Not on file   Lifestyle   ??? Physical activity:     Days per week: Not on file     Minutes per session: Not on file   ??? Stress: Not on file   Relationships   ??? Social connections:     Talks on phone: Not on file     Gets together: Not on file     Attends religious service: Not on file     Active member of club or organization: Not on file     Attends meetings of  clubs or organizations: Not on file     Relationship status: Not on file   ??? Intimate partner violence:     Fear of current or ex partner: Not on file     Emotionally abused: Not on file     Physically abused: Not on file     Forced sexual activity: Not on file   Other Topics Concern   ??? Not on file   Social History Narrative   ??? Not on file       Past Medical History:   Diagnosis Date   ??? Allergic rhinitis, cause unspecified    ??? Essential hypertension, benign    ??? Mixed hyperlipidemia    ??? Sleep apnea        Family History   Problem Relation Age of Onset   ??? Hypertension Mother    ??? High Cholesterol Mother    ??? Hypertension Father    ??? High Cholesterol Father    ??? Diabetes Maternal Aunt    ??? Heart Disease Paternal Uncle    ??? Heart Disease Paternal Grandfather        No Known Allergies    ROS:  Respiratory: Denies chest pain, shortness of breath at rest or with exertion, wheezing  CV: Denies chest pain at rest or with exertion, claudication, orthopnea, Dyspnea with exertion, irregular heart beat, palpitations  GI: denies abdominal pain, nausea, vomiting, diarrhea  Peripheral Vascular: Denies: discoloration of skin, cold extremities, loss of sensation    Vitals:    12/03/17 1021   BP: 121/85   Pulse: 70   SpO2: 97%   Weight: 213 lb (96.6 kg)   Height: 5' 7.5"  (1.715 m)         EXAMINATION:  HEENT: Atraumatic, normocephalic, Extra occular movements full and smooth, Sclera non-icteric, Tympanic membranes normal bilaterally, EAC clear bilaterally, Oropharynx moist,   Neck: supple, full range of motion, no bruits and no JVD, no lymphadenopathy  Chest: Lungs clear to ascultation with good air movement  CV: Regular rate and rhythm  Without murmur   Abdomen: Soft, normal bowel sounds in all 4 quadrants, No organomegaly rebound guarding or tenderness  Peripheral vascular: Pulses 2+ equal bilaterally, skin warm dry intact without discoloration  Extremities: No cyanosis, clubbing or edema  Neurologic: Non focal    1. Essential hypertension  Continue meds. Doing well.     2. Fatigue, unspecified type  rehceck testosterone today.         Patient has been instructed to follow a low fat diet and exercise at least 150 minutes a week.  We will obtain labs according to current schedule and review. Discussed impact of exercise and sodium restriction on hypertension. Educated on control being imperative to reduce long term risks such as congestive heart failure, stroke, heart attacks.     Loretha Brasil, NP      Discussed the patient's BMI with him.  The BMI follow up plan is as follows:     dietary management education, guidance, and counseling  encourage exercise  monitor weight  prescribed dietary intake    An After Visit Summary was printed and given to the patient.

## 2017-12-03 NOTE — Progress Notes (Signed)
Testosterone level and Free testosterone level are normal

## 2017-12-03 NOTE — Progress Notes (Signed)
Chief Complaint   Patient presents with   ??? Hypertension     follow up       HPI:  Alexander Howell is a 47 y.o. male who has a history of hyperlipidemia and hypertension.  Patient reports dietary and exercise compliance.  Patient is  on medications.  Denies any significant side effects.  Patient has  had recent fasting labs.         Current Outpatient Medications   Medication Sig Dispense Refill   ??? valACYclovir (VALTREX) 500 mg tablet Take 1 Tab by mouth daily. 90 Tab 3   ??? escitalopram oxalate (LEXAPRO) 10 mg tablet Take 1 Tab by mouth daily. 10 Tab 0   ??? atorvastatin (LIPITOR) 40 mg tablet Take 1 Tab by mouth daily. TAKE ONE TABLET BY MOUTH ONCE DAILY 90 Tab 3   ??? lisinopril (PRINIVIL, ZESTRIL) 5 mg tablet Take 1 Tab by mouth daily. 90 Tab 3   ??? escitalopram oxalate (LEXAPRO) 20 mg tablet Take 1 Tab by mouth daily. 90 Tab 3   ??? cpap machine kit Needs new mask, hose, head gear, and filter for cpap therapy. 1 Kit 0   ??? cpap machine kit Product Unit Price Quantity Total   AirFit??? N20 Nasal CPAP Mask with Headgear - Medium $99.00 1 $99.00   Return Insurance For AirFit??? N20 Nasal CPAP Mask with Headgear $0.00 1 $0.00   White 6 Foot Performance 75m Tubing with 275mEasy Grip Cuffs $12.95 1 $12.95 1 Kit 0   ??? meloxicam (MOBIC) 15 mg tablet TAKE ONE TABLET BY MOUTH ONCE DAILY 30 Tab 0   ??? cyclobenzaprine (FLEXERIL) 10 mg tablet Take 1 Tab by mouth three (3) times daily as needed for Muscle Spasm(s). 90 Tab 1   ??? fluticasone (FLONASE) 50 mcg/actuation nasal spray 2 Sprays by Both Nostrils route daily. 3 Bottle 3         Social History     Socioeconomic History   ??? Marital status: MARRIED     Spouse name: Not on file   ??? Number of children: Not on file   ??? Years of education: Not on file   ??? Highest education level: Not on file   Occupational History   ??? Not on file   Social Needs   ??? Financial resource strain: Not on file   ??? Food insecurity:     Worry: Not on file     Inability: Not on file    ??? Transportation needs:     Medical: Not on file     Non-medical: Not on file   Tobacco Use   ??? Smoking status: Never Smoker   ??? Smokeless tobacco: Never Used   Substance and Sexual Activity   ??? Alcohol use: Yes     Alcohol/week: 0.0 oz     Comment: monthly or less; 1-2 drinks   ??? Drug use: Not on file   ??? Sexual activity: Not on file   Lifestyle   ??? Physical activity:     Days per week: Not on file     Minutes per session: Not on file   ??? Stress: Not on file   Relationships   ??? Social connections:     Talks on phone: Not on file     Gets together: Not on file     Attends religious service: Not on file     Active member of club or organization: Not on file     Attends meetings of  clubs or organizations: Not on file     Relationship status: Not on file   ??? Intimate partner violence:     Fear of current or ex partner: Not on file     Emotionally abused: Not on file     Physically abused: Not on file     Forced sexual activity: Not on file   Other Topics Concern   ??? Not on file   Social History Narrative   ??? Not on file       Past Medical History:   Diagnosis Date   ??? Allergic rhinitis, cause unspecified    ??? Essential hypertension, benign    ??? Mixed hyperlipidemia    ??? Sleep apnea        Family History   Problem Relation Age of Onset   ??? Hypertension Mother    ??? High Cholesterol Mother    ??? Hypertension Father    ??? High Cholesterol Father    ??? Diabetes Maternal Aunt    ??? Heart Disease Paternal Uncle    ??? Heart Disease Paternal Grandfather        No Known Allergies    ROS:  Respiratory: Denies chest pain, shortness of breath at rest or with exertion, wheezing  CV: Denies chest pain at rest or with exertion, claudication, orthopnea, Dyspnea with exertion, irregular heart beat, palpitations  GI: denies abdominal pain, nausea, vomiting, diarrhea  Peripheral Vascular: Denies: discoloration of skin, cold extremities, loss of sensation    Vitals:    12/03/17 1021   BP: 121/85   Pulse: 70   SpO2: 97%    Weight: 213 lb (96.6 kg)   Height: 5' 7.5" (1.715 m)         EXAMINATION:  HEENT: Atraumatic, normocephalic, Extra occular movements full and smooth, Sclera non-icteric, Tympanic membranes normal bilaterally, EAC clear bilaterally, Oropharynx moist,   Neck: supple, full range of motion, no bruits and no JVD, no lymphadenopathy  Chest: Lungs clear to ascultation with good air movement  CV: Regular rate and rhythm  Without murmur   Abdomen: Soft, normal bowel sounds in all 4 quadrants, No organomegaly rebound guarding or tenderness  Peripheral vascular: Pulses 2+ equal bilaterally, skin warm dry intact without discoloration  Extremities: No cyanosis, clubbing or edema  Neurologic: Non focal    1. Essential hypertension  Continue meds. Doing well.     2. Fatigue, unspecified type  rehceck testosterone today.         Patient has been instructed to follow a low fat diet and exercise at least 150 minutes a week.  We will obtain labs according to current schedule and review. Discussed impact of exercise and sodium restriction on hypertension. Educated on control being imperative to reduce long term risks such as congestive heart failure, stroke, heart attacks.     Loretha Brasil, NP      Discussed the patient's BMI with him.  The BMI follow up plan is as follows:     dietary management education, guidance, and counseling  encourage exercise  monitor weight  prescribed dietary intake    An After Visit Summary was printed and given to the patient.

## 2017-12-03 NOTE — Progress Notes (Signed)
Testosterone level and Free testosterone level are normal

## 2017-12-03 NOTE — Patient Instructions (Signed)
Body Mass Index: Care Instructions  Your Care Instructions    Body mass index (BMI) can help you see if your weight is raising your risk for health problems. It uses a formula to compare how much you weigh with how tall you are.  ?? A BMI lower than 18.5 is considered underweight.  ?? A BMI between 18.5 and 24.9 is considered healthy.  ?? A BMI between 25 and 29.9 is considered overweight. A BMI of 30 or higher is considered obese.  If your BMI is in the normal range, it means that you have a lower risk for weight-related health problems. If your BMI is in the overweight or obese range, you may be at increased risk for weight-related health problems, such as high blood pressure, heart disease, stroke, arthritis or joint pain, and diabetes. If your BMI is in the underweight range, you may be at increased risk for health problems such as fatigue, lower protection (immunity) against illness, muscle loss, bone loss, hair loss, and hormone problems.  BMI is just one measure of your risk for weight-related health problems. You may be at higher risk for health problems if you are not active, you eat an unhealthy diet, or you drink too much alcohol or use tobacco products.  Follow-up care is a key part of your treatment and safety. Be sure to make and go to all appointments, and call your doctor if you are having problems. It's also a good idea to know your test results and keep a list of the medicines you take.  How can you care for yourself at home?  ?? Practice healthy eating habits. This includes eating plenty of fruits, vegetables, whole grains, lean protein, and low-fat dairy.  ?? If your doctor recommends it, get more exercise. Walking is a good choice. Bit by bit, increase the amount you walk every day. Try for at least 30 minutes on most days of the week.  ?? Do not smoke. Smoking can increase your risk for health problems. If you need help quitting, talk to your doctor about stop-smoking programs and  medicines. These can increase your chances of quitting for good.  ?? Limit alcohol to 2 drinks a day for men and 1 drink a day for women. Too much alcohol can cause health problems.  If you have a BMI higher than 25  ?? Your doctor may do other tests to check your risk for weight-related health problems. This may include measuring the distance around your waist. A waist measurement of more than 40 inches in men or 35 inches in women can increase the risk of weight-related health problems.  ?? Talk with your doctor about steps you can take to stay healthy or improve your health. You may need to make lifestyle changes to lose weight and stay healthy, such as changing your diet and getting regular exercise.  If you have a BMI lower than 18.5  ?? Your doctor may do other tests to check your risk for health problems.  ?? Talk with your doctor about steps you can take to stay healthy or improve your health. You may need to make lifestyle changes to gain or maintain weight and stay healthy, such as getting more healthy foods in your diet and doing exercises to build muscle.  Where can you learn more?  Go to http://www.healthwise.net/[employer].  Enter S176 in the search box to learn more about "Body Mass Index: Care Instructions."  Current as of: April 19, 2015  Content Version: 11.4  ??   2006-2017 [employer]. Care instructions adapted under license by [employer] (which disclaims liability or warranty for this information). If you have questions about a medical condition or this instruction, always ask your healthcare professional. [employer] disclaims any warranty or liability for your use of this information.

## 2017-12-04 LAB — TESTOSTERONE, FREE & TOTAL
FREE TESTOSTERONE,DIRECT, 144981: 9.2 pg/mL (ref 6.8–21.5)
Free testosterone (Direct): 9.2 pg/mL (ref 6.8–21.5)
Testosterone: 366 ng/dL (ref 264–916)
Testosterone: 366 ng/dL (ref 264–916)

## 2018-01-03 ENCOUNTER — Ambulatory Visit (HOSPITAL_COMMUNITY)
Admission: EM | Admit: 2018-01-03 | Discharge: 2018-01-03 | Disposition: A | Discharge status: Home / Self Care | Payer: 59 | Attending: Family Medicine | Admitting: Family Medicine

## 2018-01-03 ENCOUNTER — Encounter (HOSPITAL_COMMUNITY): Payer: Self-pay | Admitting: Emergency Medicine

## 2018-01-03 ENCOUNTER — Ambulatory Visit (INDEPENDENT_AMBULATORY_CARE_PROVIDER_SITE_OTHER): Payer: 59

## 2018-01-03 DIAGNOSIS — S93492A Sprain of other ligament of left ankle, initial encounter: Secondary | ICD-10-CM

## 2018-01-03 IMAGING — DX DG ANKLE COMPLETE 3+V*L*
3 series · 3 of 3 positions shown · non-contrast
Comparison: None.

CLINICAL DATA: 47 y/o  M; twisting injury of left ankle with a pop.

EXAM:
LEFT ANKLE COMPLETE - 3+ VIEW

[ankle ap]
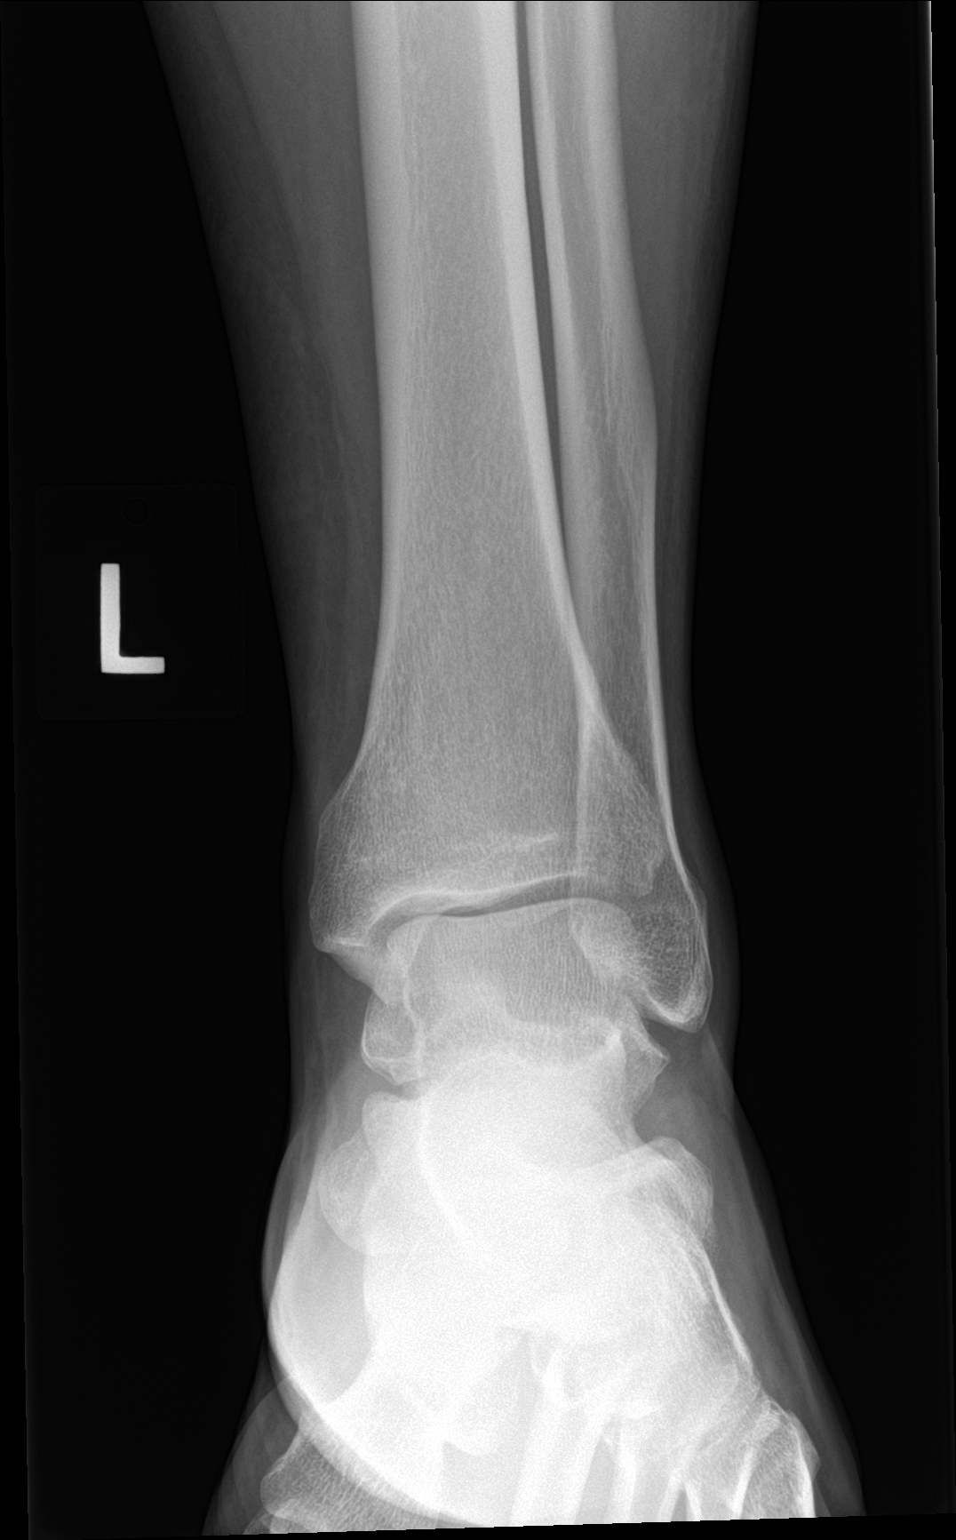

[ankle obl]
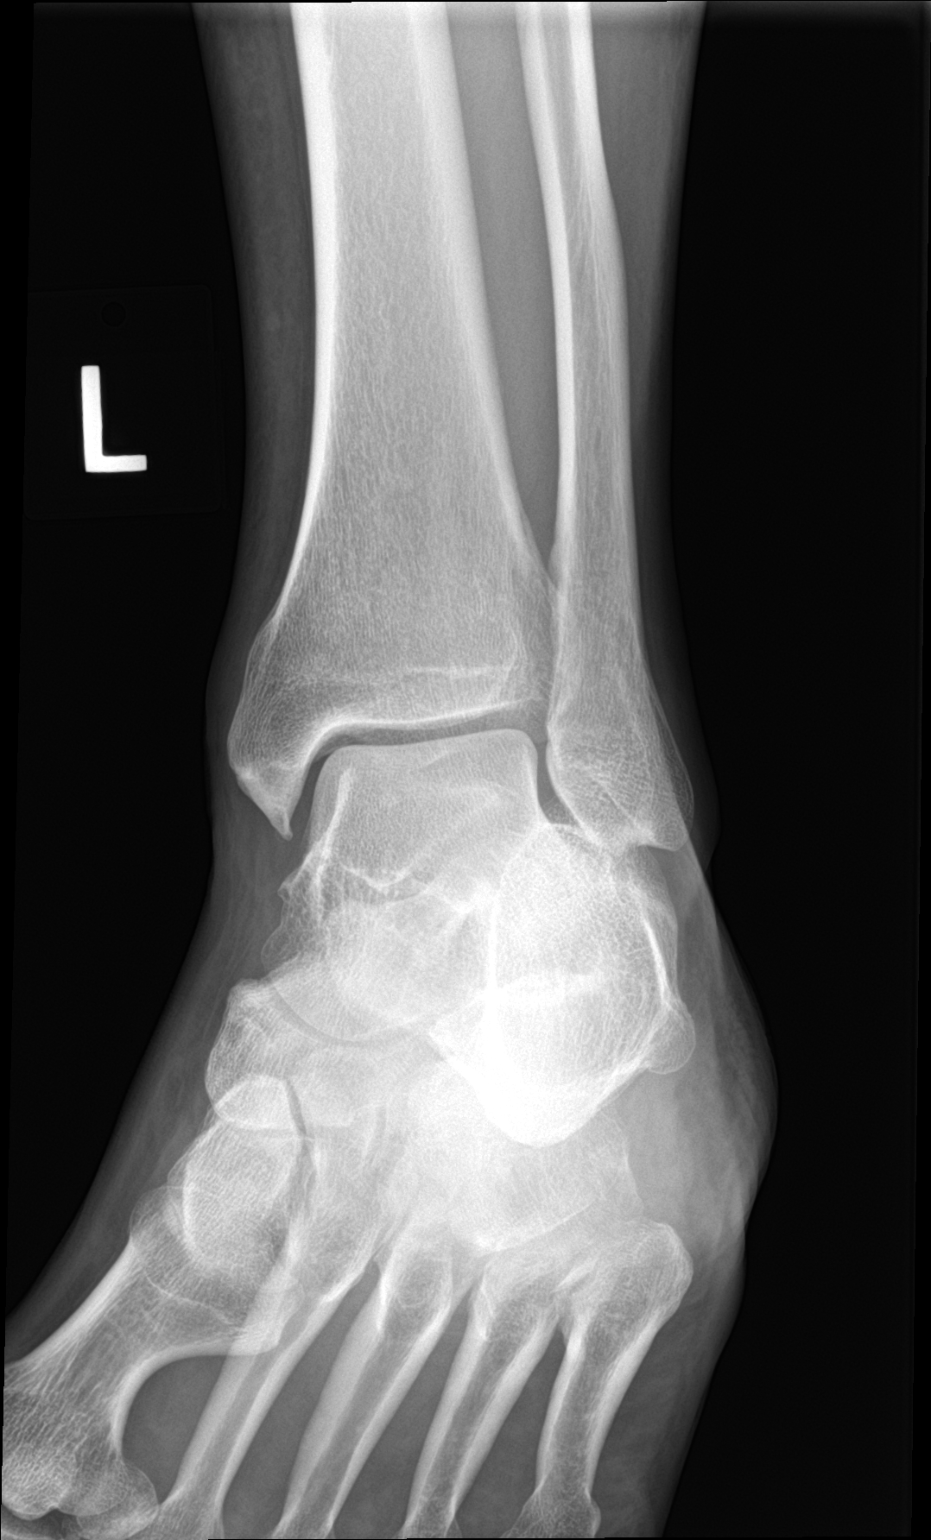

[ankle lat]
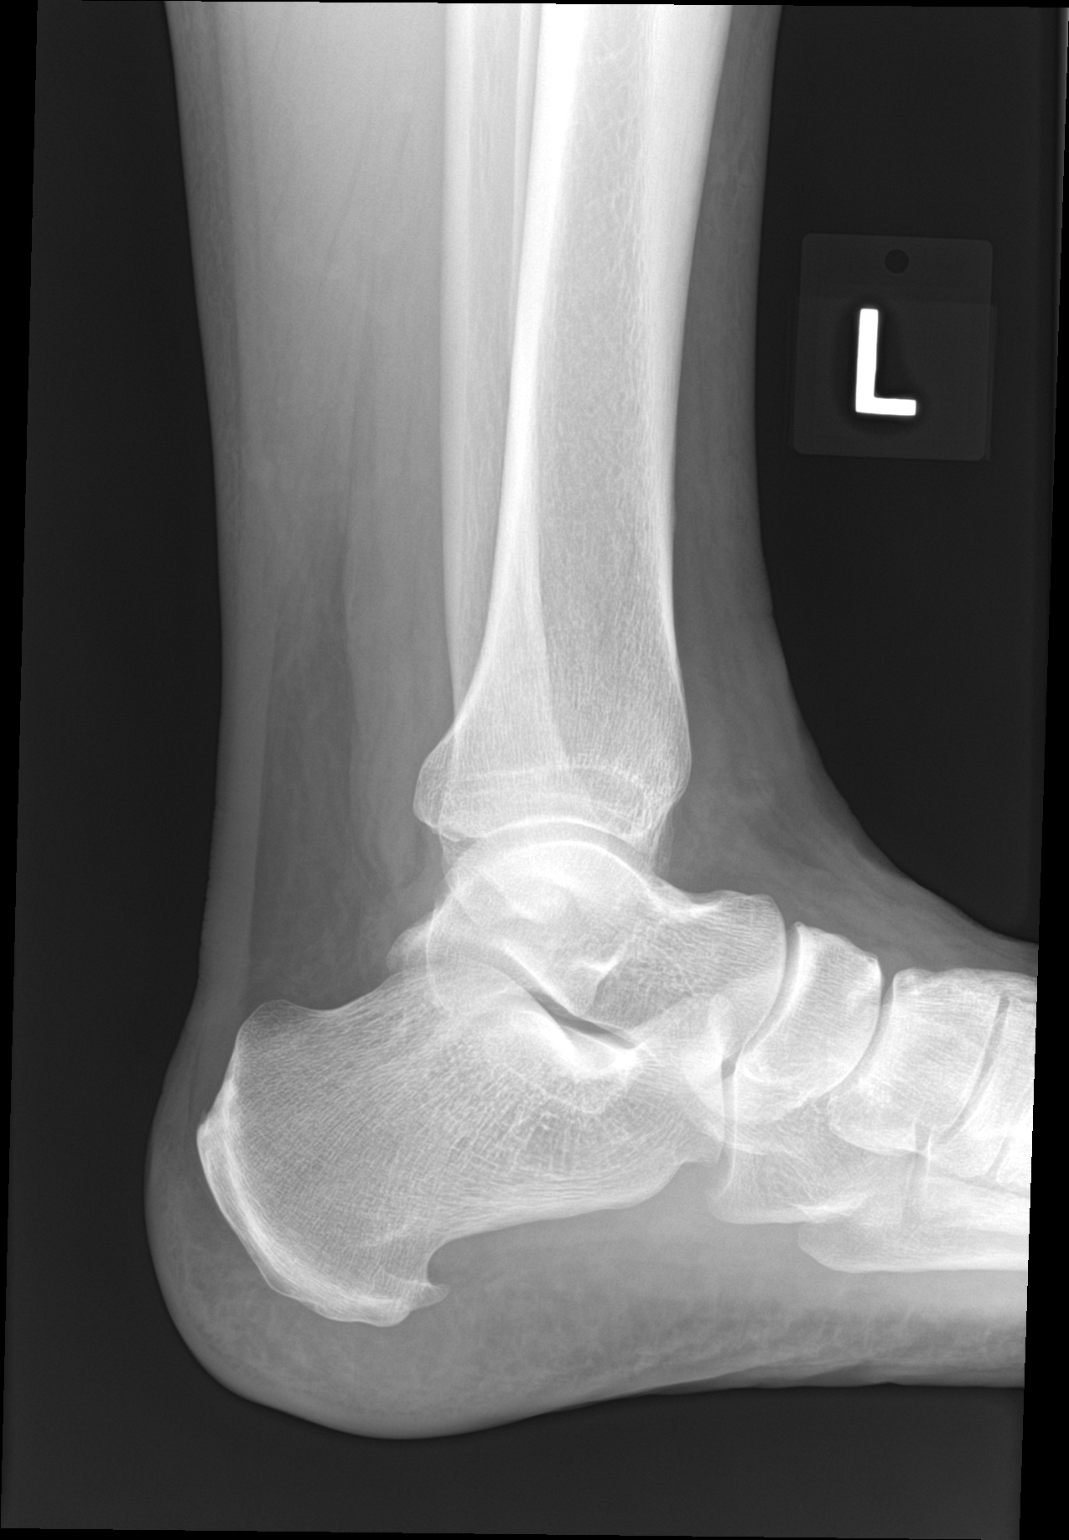

[3 of 3 positions shown; findings below may reference images not displayed]

FINDINGS: There is no evidence of fracture, dislocation, or joint effusion.
There is no evidence of arthropathy or other focal bone abnormality.
Talar dome is intact. Ankle mortise is symmetric on these nonstress
views. Small plantar calcaneal enthesophyte.
IMPRESSION: No acute fracture or dislocation identified.

By: GIRBE M.D.

## 2018-01-03 NOTE — ED Triage Notes (Signed)
Pt slipped two hours pta and twisted his L ankle.

## 2018-01-03 NOTE — Discharge Instructions (Signed)
Use ice, elevation, and ibuprofen for any pain and swelling. Limit walking on uneven ground. Return if symptoms worsen.

## 2018-01-03 NOTE — ED Provider Notes (Signed)
[place]    CSN: [id number] Arrival date & time: 01/03/18  1913     History   Chief Complaint Chief Complaint  Patient presents with  . Ankle Pain    HPI DEMARRIUS GUERRERO is a 47 y.o. male.   HPI  Past Medical History:  Diagnosis Date  . Family history of adverse reaction to anesthesia    mother slow to awaken  . History of kidney stones 2007    Patient Active Problem List   Diagnosis Date Noted  . Hyperbilirubinemia 02/04/2016  . Bile leak, postoperative     Past Surgical History:  Procedure Laterality Date  . CHOLECYSTECTOMY N/A 02/01/2016   Procedure: LAPAROSCOPIC CHOLECYSTECTOMY WITH INTRAOPERATIVE CHOLANGIOGRAM;  Surgeon: Coralie Keens, MD;  Location: [facility];  Service: General;  Laterality: N/A;  . cyst removed     between his front teeth and cyst removed from abdomal area  . ESOPHAGOGASTRODUODENOSCOPY (EGD) WITH PROPOFOL N/A 03/06/2016   Procedure: ESOPHAGOGASTRODUODENOSCOPY (EGD) WITH PROPOFOL;  Surgeon: Doran Stabler, MD;  Location: [facility];  Service: Gastroenterology;  Laterality: N/A;  with stent removal.  . GASTROINTESTINAL STENT REMOVAL N/A 03/06/2016   Procedure: GASTROINTESTINAL STENT REMOVAL;  Surgeon: Doran Stabler, MD;  Location: [facility];  Service: Gastroenterology;  Laterality: N/A;  . TONSILLECTOMY    . ULNAR NERVE TRANSPOSITION         Home Medications    Prior to Admission medications   Medication Sig Start Date End Date Taking? Authorizing Provider  acetaminophen (TYLENOL) 325 MG tablet You can take 2 tablets every 4 hours as needed for pain. This is in your prescribed pain medicine. Do not take more than 4000 mg of Tylenol/acetaminophen per day. 02/06/16   Earnstine Regal, PA-C  docusate sodium (COLACE) 100 MG capsule Take 100 mg by mouth 2 (two) times daily as needed for mild constipation.    [provider]  ondansetron (ZOFRAN) 4 MG tablet Take 1 tablet (4 mg total) by mouth every 6 (six) hours.  01/23/16   Virgel Manifold, MD  simethicone (MYLICON) 80 MG chewable tablet Chew 80-160 mg by mouth every 6 (six) hours as needed for flatulence.    [provider]    Family History No family history on file.  Social History Social History   Tobacco Use  . Smoking status: Never Smoker  . Smokeless tobacco: Never Used  Substance Use Topics  . Alcohol use: No    Comment: social at times  . Drug use: No     Allergies   No known allergies   Review of Systems Review of Systems  Constitutional: Negative for chills and fever.  HENT: Negative for ear pain and sore throat.   Eyes: Negative for pain and visual disturbance.  Respiratory: Negative for cough and shortness of breath.   Cardiovascular: Negative for chest pain and palpitations.  Gastrointestinal: Negative for abdominal pain and vomiting.  Genitourinary: Negative for dysuria and hematuria.  Musculoskeletal: Positive for arthralgias and gait problem. Negative for back pain.  Skin: Negative for color change and rash.  Neurological: Negative for seizures and syncope.  All other systems reviewed and are negative.    Physical Exam Triage Vital Signs ED Triage Vitals [01/03/18 1944]  Enc Vitals Group     BP 118/77     Pulse      Resp 16     Temp 98.5 F (36.9 C)     Temp Source Oral     SpO2  98 %     Weight      Height      Head Circumference      Peak Flow      Pain Score      Pain Loc      Pain Edu?      Excl. in [place]?    No data found.  Updated Vital Signs BP 118/77 (BP Location: Right Arm)   Temp 98.5 F (36.9 C) (Oral)   Resp 16   SpO2 98%   Visual Acuity Right Eye Distance:   Left Eye Distance:   Bilateral Distance:    Right Eye Near:   Left Eye Near:    Bilateral Near:     Physical Exam  Constitutional: He appears well-developed and well-nourished. No distress.  HENT:  Head: Normocephalic and atraumatic.  Mouth/Throat: Oropharynx is clear and moist.  Eyes: Pupils are equal,  round, and reactive to light. Conjunctivae are normal.  Neck: Normal range of motion.  Cardiovascular: Normal rate.  Pulmonary/Chest: Effort normal. No respiratory distress.  Abdominal: Soft. He exhibits no distension.  Musculoskeletal: Normal range of motion. He exhibits no edema.  Left ankle examined.  Minimal lateral swelling.  Tenderness over ATFL.  No bony tenderness.  Full range of motion.  No instability.  Neurological: He is alert.  Skin: Skin is warm and dry.  Alopecia     UC Treatments / Results  Labs (all labs ordered are listed, but only abnormal results are displayed) Labs Reviewed - No data to display  EKG None  Radiology Dg Ankle Complete Left  Result Date: 01/03/2018 CLINICAL DATA:  47 y/o  M; twisting injury of left ankle with a pop. EXAM: LEFT ANKLE COMPLETE - 3+ VIEW COMPARISON:  None. FINDINGS: There is no evidence of fracture, dislocation, or joint effusion. There is no evidence of arthropathy or other focal bone abnormality. Talar dome is intact. Ankle mortise is symmetric on these nonstress views. Small plantar calcaneal enthesophyte. IMPRESSION: No acute fracture or dislocation identified. Electronically Signed   By: Kristine Garbe M.D.   On: 01/03/2018 20:27    Procedures Procedures (including critical care time)  Medications Ordered in UC Medications - No data to display  Initial Impression / Assessment and Plan / UC Course  I have reviewed the triage vital signs and the nursing notes.  Pertinent labs & imaging results that were available during my care of the patient were reviewed by me and considered in my medical decision making (see chart for details).     Discussed ankle sprain.  Protection of ankle against reinjury.  Brace.  Athletics.  Ortho if fails to improve Final Clinical Impressions(s) / UC Diagnoses   Final diagnoses:  Sprain of anterior talofibular ligament of left ankle, initial encounter     Discharge Instructions      Use ice, elevation, and ibuprofen for any pain and swelling. Limit walking on uneven ground. Return if symptoms worsen.   ED Prescriptions    None     Controlled Substance Prescriptions [place] Controlled Substance Registry consulted? Not Applicable   Raylene Everts, MD 01/03/18 2158

## 2018-02-22 MED ORDER — DOXEPIN 10 MG CAP
10 mg | ORAL_CAPSULE | Freq: Every evening | ORAL | 0 refills | Status: DC
Start: 2018-02-22 — End: 2018-04-08

## 2018-02-22 NOTE — Telephone Encounter (Signed)
From: Eustace PenJames Douglas Archibald  To: Earney HamburgKeaton, Damonique Brunelle T, NP  Sent: 02/22/2018 6:56 AM EDT  Subject: Non-Urgent Medical Question    I???m having a little trouble sleeping through the night. I???ve cut my caffeine intake in half and drink no coffee after about 10:00 in the morning. I???m also still experiencing afternoon drowsiness to the point of having to pull into a rest area for a nap if I???m traveling for work. I use my CPAP nightly 29 of  the last thirty nights. I???ve tried Benadryl which normally works but I???m unable to sleep through the night. Waking is not caused by bathroom breaks just waking up startled and unable to go back to sleep. Is there anything you can recommend? Thanks for your time.

## 2018-04-01 ENCOUNTER — Encounter

## 2018-04-01 MED ORDER — ESCITALOPRAM 20 MG TAB
20 mg | ORAL_TABLET | ORAL | 3 refills | Status: AC
Start: 2018-04-01 — End: ?

## 2018-04-01 MED ORDER — ESCITALOPRAM 20 MG TAB
20 mg | ORAL_TABLET | Freq: Every day | ORAL | 3 refills | Status: AC
Start: 2018-04-01 — End: ?

## 2018-04-08 ENCOUNTER — Ambulatory Visit: Attending: Family | Primary: Family

## 2018-04-08 ENCOUNTER — Ambulatory Visit: Admit: 2018-04-08 | Discharge: 2018-04-08 | Payer: BLUE CROSS/BLUE SHIELD | Attending: Family | Primary: Family

## 2018-04-08 DIAGNOSIS — R21 Rash and other nonspecific skin eruption: Secondary | ICD-10-CM

## 2018-04-08 MED ORDER — TERBINAFINE 250 MG TAB
250 mg | ORAL_TABLET | Freq: Every day | ORAL | 0 refills | Status: AC
Start: 2018-04-08 — End: ?

## 2018-04-08 NOTE — Progress Notes (Signed)
Chief Complaint   Patient presents with   ??? Rash     Started 6 months ago, itches.  Buttocks, states it might be a heat rash.   ??? Headache     Started 2 weeks ago       Alexander Howell is a 47 y.o. male        HPI  Here for rash that started 6 months ago, itches.  States that might be heat rash.  States that comes up during the sunmer.  States that has had headaches started 2 weeks ago. Sometimes wakes up with it.  Today it hit around 10am.  States that its sporatic.     Has tried lotrimin.     Past Medical History:   Diagnosis Date   ??? Allergic rhinitis, cause unspecified    ??? Essential hypertension, benign    ??? Mixed hyperlipidemia    ??? Sleep apnea        Past Surgical History:   Procedure Laterality Date   ??? HX OTHER SURGICAL  09/2016    dental implant         Social History     Socioeconomic History   ??? Marital status: LEGALLY SEPARATED     Spouse name: Not on file   ??? Number of children: Not on file   ??? Years of education: Not on file   ??? Highest education level: Not on file   Occupational History   ??? Not on file   Social Needs   ??? Financial resource strain: Not on file   ??? Food insecurity:     Worry: Not on file     Inability: Not on file   ??? Transportation needs:     Medical: Not on file     Non-medical: Not on file   Tobacco Use   ??? Smoking status: Never Smoker   ??? Smokeless tobacco: Never Used   Substance and Sexual Activity   ??? Alcohol use: Yes     Alcohol/week: 0.0 standard drinks     Comment: monthly or less; 1-2 drinks   ??? Drug use: Not on file   ??? Sexual activity: Not on file   Lifestyle   ??? Physical activity:     Days per week: Not on file     Minutes per session: Not on file   ??? Stress: Not on file   Relationships   ??? Social connections:     Talks on phone: Not on file     Gets together: Not on file     Attends religious service: Not on file     Active member of club or organization: Not on file     Attends meetings of clubs or organizations: Not on file     Relationship status: Not on file   ???  Intimate partner violence:     Fear of current or ex partner: Not on file     Emotionally abused: Not on file     Physically abused: Not on file     Forced sexual activity: Not on file   Other Topics Concern   ??? Not on file   Social History Narrative   ??? Not on file       Family History   Problem Relation Age of Onset   ??? Hypertension Mother    ??? High Cholesterol Mother    ??? Hypertension Father    ??? High Cholesterol Father    ??? Diabetes Maternal Aunt    ???  Heart Disease Paternal Uncle    ??? Heart Disease Paternal Grandfather        Current Outpatient Medications   Medication Sig Dispense Refill   ??? escitalopram oxalate (LEXAPRO) 20 mg tablet TAKE 1 TABLET BY MOUTH ONCE DAILY 90 Tab 3   ??? escitalopram oxalate (LEXAPRO) 20 mg tablet Take 1 Tab by mouth daily. 90 Tab 3   ??? valACYclovir (VALTREX) 500 mg tablet Take 1 Tab by mouth daily. 90 Tab 3   ??? atorvastatin (LIPITOR) 40 mg tablet Take 1 Tab by mouth daily. TAKE ONE TABLET BY MOUTH ONCE DAILY 90 Tab 3   ??? lisinopril (PRINIVIL, ZESTRIL) 5 mg tablet Take 1 Tab by mouth daily. 90 Tab 3   ??? cpap machine kit Needs new mask, hose, head gear, and filter for cpap therapy. 1 Kit 0   ??? cpap machine kit Product Unit Price Quantity Total   AirFit??? N20 Nasal CPAP Mask with Headgear - Medium $99.00 1 $99.00   Return Insurance For AirFit??? N20 Nasal CPAP Mask with Headgear $0.00 1 $0.00   White 6 Foot Performance 71m Tubing with 238mEasy Grip Cuffs $12.95 1 $12.95 1 Kit 0   ??? fluticasone (FLONASE) 50 mcg/actuation nasal spray 2 Sprays by Both Nostrils route daily. 3 Bottle 3         Review of Systems - General ROS: negative  ENT ROS: negative  Endocrine ROS: negative  Breast ROS: negative for breast lumps  Respiratory ROS: no cough, shortness of breath, or wheezing  Cardiovascular ROS: no chest pain or dyspnea on exertion  Neurological ROS: no TIA or stroke symptoms        Visit Vitals  BP 130/87   Pulse 70   Temp 98.2 ??F (36.8 ??C)   Ht 5' 7.5" (1.715 m)   Wt 215 lb (97.5 kg)    SpO2 97%   BMI 33.18 kg/m??           Physical Examination: General appearance - alert, well appearing, and in no distress and oriented to person, place, and time  Mental status - alert, oriented to person, place, and time, normal mood, behavior, speech, dress, motor activity, and thought processes  Eyes - pupils equal and reactive, extraocular eye movements intact  Ears - bilateral TM's and external ear canals normal  Nose - normal and patent, no erythema, discharge or polyps  Mouth - mucous membranes moist, pharynx normal without lesions  Neck - supplethere is what appears to be a nodule on the right side of pharynx on the right side.   Lymphatics - no palpable lymphadenopathy, no hepatosplenomegaly  Chest - clear to auscultation, no wheezes, rales or rhonchi, symmetric air entry  Heart - normal rate, regular rhythm, normal S1, S2, no murmurs, rubs, clicks or gallops  Abdomen - soft, nontender, nondistended, no masses or organomegaly  Neurological - alert, oriented, normal speech, no focal findings or movement disorder noted  Musculoskeletal - no joint tenderness, deformity or swelling  Skin -there is small patch on the left and right buttock.  It has raised area. And itches.       .  Assessment and plan:        1. Rash and nonspecific skin eruption  Hydrocortisone to help with itching.  Use lamisli x 2 weeks.     2. Nonintractable headache, unspecified chronicity pattern, unspecified headache type      3. Encounter for immunization    - INFLUENZA VIRUS VAC QUAD,SPLIT,PRESV FREE SYRINGE IM  -  PR IMMUNIZ ADMIN,1 SINGLE/COMB VAC/TOXOID    4. Mass of right side of neck  Will send for ultrasound of the neck.

## 2018-04-08 NOTE — Progress Notes (Signed)
Chief Complaint   Patient presents with   ??? Rash     Started 6 months ago, itches.  Buttocks, states it might be a heat rash.   ??? Headache     Started 2 weeks ago       Alexander Howell is a 47 y.o. male        HPI  Here for rash that started 6 months ago, itches.  States that might be heat rash.  States that comes up during the sunmer.  States that has had headaches started 2 weeks ago. Sometimes wakes up with it.  Today it hit around 10am.  States that its sporatic.     Has tried lotrimin.     Past Medical History:   Diagnosis Date   ??? Allergic rhinitis, cause unspecified    ??? Essential hypertension, benign    ??? Mixed hyperlipidemia    ??? Sleep apnea        Past Surgical History:   Procedure Laterality Date   ??? HX OTHER SURGICAL  09/2016    dental implant         Social History     Socioeconomic History   ??? Marital status: LEGALLY SEPARATED     Spouse name: Not on file   ??? Number of children: Not on file   ??? Years of education: Not on file   ??? Highest education level: Not on file   Occupational History   ??? Not on file   Social Needs   ??? Financial resource strain: Not on file   ??? Food insecurity:     Worry: Not on file     Inability: Not on file   ??? Transportation needs:     Medical: Not on file     Non-medical: Not on file   Tobacco Use   ??? Smoking status: Never Smoker   ??? Smokeless tobacco: Never Used   Substance and Sexual Activity   ??? Alcohol use: Yes     Alcohol/week: 0.0 standard drinks     Comment: monthly or less; 1-2 drinks   ??? Drug use: Not on file   ??? Sexual activity: Not on file   Lifestyle   ??? Physical activity:     Days per week: Not on file     Minutes per session: Not on file   ??? Stress: Not on file   Relationships   ??? Social connections:     Talks on phone: Not on file     Gets together: Not on file     Attends religious service: Not on file     Active member of club or organization: Not on file     Attends meetings of clubs or organizations: Not on file     Relationship status: Not on file    ??? Intimate partner violence:     Fear of current or ex partner: Not on file     Emotionally abused: Not on file     Physically abused: Not on file     Forced sexual activity: Not on file   Other Topics Concern   ??? Not on file   Social History Narrative   ??? Not on file       Family History   Problem Relation Age of Onset   ??? Hypertension Mother    ??? High Cholesterol Mother    ??? Hypertension Father    ??? High Cholesterol Father    ??? Diabetes Maternal Aunt    ???  Heart Disease Paternal Uncle    ??? Heart Disease Paternal Grandfather        Current Outpatient Medications   Medication Sig Dispense Refill   ??? escitalopram oxalate (LEXAPRO) 20 mg tablet TAKE 1 TABLET BY MOUTH ONCE DAILY 90 Tab 3   ??? escitalopram oxalate (LEXAPRO) 20 mg tablet Take 1 Tab by mouth daily. 90 Tab 3   ??? valACYclovir (VALTREX) 500 mg tablet Take 1 Tab by mouth daily. 90 Tab 3   ??? atorvastatin (LIPITOR) 40 mg tablet Take 1 Tab by mouth daily. TAKE ONE TABLET BY MOUTH ONCE DAILY 90 Tab 3   ??? lisinopril (PRINIVIL, ZESTRIL) 5 mg tablet Take 1 Tab by mouth daily. 90 Tab 3   ??? cpap machine kit Needs new mask, hose, head gear, and filter for cpap therapy. 1 Kit 0   ??? cpap machine kit Product Unit Price Quantity Total   AirFit??? N20 Nasal CPAP Mask with Headgear - Medium $99.00 1 $99.00   Return Insurance For AirFit??? N20 Nasal CPAP Mask with Headgear $0.00 1 $0.00   White 6 Foot Performance 13m Tubing with 2108mEasy Grip Cuffs $12.95 1 $12.95 1 Kit 0   ??? fluticasone (FLONASE) 50 mcg/actuation nasal spray 2 Sprays by Both Nostrils route daily. 3 Bottle 3         Review of Systems - General ROS: negative  ENT ROS: negative  Endocrine ROS: negative  Breast ROS: negative for breast lumps  Respiratory ROS: no cough, shortness of breath, or wheezing  Cardiovascular ROS: no chest pain or dyspnea on exertion  Neurological ROS: no TIA or stroke symptoms        Visit Vitals  BP 130/87   Pulse 70   Temp 98.2 ??F (36.8 ??C)   Ht 5' 7.5" (1.715 m)   Wt 215 lb (97.5 kg)    SpO2 97%   BMI 33.18 kg/m??           Physical Examination: General appearance - alert, well appearing, and in no distress and oriented to person, place, and time  Mental status - alert, oriented to person, place, and time, normal mood, behavior, speech, dress, motor activity, and thought processes  Eyes - pupils equal and reactive, extraocular eye movements intact  Ears - bilateral TM's and external ear canals normal  Nose - normal and patent, no erythema, discharge or polyps  Mouth - mucous membranes moist, pharynx normal without lesions  Neck - supplethere is what appears to be a nodule on the right side of pharynx on the right side.   Lymphatics - no palpable lymphadenopathy, no hepatosplenomegaly  Chest - clear to auscultation, no wheezes, rales or rhonchi, symmetric air entry  Heart - normal rate, regular rhythm, normal S1, S2, no murmurs, rubs, clicks or gallops  Abdomen - soft, nontender, nondistended, no masses or organomegaly  Neurological - alert, oriented, normal speech, no focal findings or movement disorder noted  Musculoskeletal - no joint tenderness, deformity or swelling  Skin -there is small patch on the left and right buttock.  It has raised area. And itches.       .  Assessment and plan:        1. Rash and nonspecific skin eruption  Hydrocortisone to help with itching.  Use lamisli x 2 weeks.     2. Nonintractable headache, unspecified chronicity pattern, unspecified headache type      3. Encounter for immunization    - INFLUENZA VIRUS VAC QUAD,SPLIT,PRESV FREE SYRINGE IM  -  PR IMMUNIZ ADMIN,1 SINGLE/COMB VAC/TOXOID    4. Mass of right side of neck  Will send for ultrasound of the neck.

## 2018-04-14 NOTE — Telephone Encounter (Signed)
From: Eustace Pen  To: Earney Hamburg, NP  Sent: 04/14/2018 2:20 AM EDT  Subject: Visit Follow-Up Question    I haven???t heard anything regarding the ultra sound you ordered.

## 2018-04-14 NOTE — Telephone Encounter (Signed)
Alexander Howell can you call and have them call him to schedule.

## 2018-04-20 ENCOUNTER — Inpatient Hospital Stay: Admit: 2018-04-20 | Payer: BLUE CROSS/BLUE SHIELD | Attending: Family | Primary: Family

## 2018-04-20 DIAGNOSIS — R221 Localized swelling, mass and lump, neck: Secondary | ICD-10-CM

## 2018-04-20 NOTE — Progress Notes (Signed)
Good news is the areas I felt, are 2 small lymph nodes.  They do not appear to have any worrisome findings to them. I will recheck them at your next visit in December.

## 2018-05-05 ENCOUNTER — Encounter

## 2018-05-05 MED ORDER — CICLOPIROX 0.77 % TOPICAL CREAM
0.77 % | Freq: Two times a day (BID) | CUTANEOUS | 1 refills | Status: AC
Start: 2018-05-05 — End: ?

## 2018-05-05 NOTE — Telephone Encounter (Signed)
From: Eustace Pen  To: Earney Hamburg, NP  Sent: 05/05/2018 12:53 PM EDT  Subject: Non-Urgent Medical Question    The pill you gave me to take for the ass rash worked well. It completely eliminated the rash along with athletes foot that I had on my right foot forever.     You mentioned that most of the over the counter fungal meds have been rendered nearly useless. Is there a prescription strength topical cream that you would recommend or send a prescription in for?     Thanks

## 2018-06-07 ENCOUNTER — Ambulatory Visit: Attending: Family | Primary: Family

## 2018-06-07 ENCOUNTER — Ambulatory Visit: Admit: 2018-06-07 | Discharge: 2018-06-07 | Payer: BLUE CROSS/BLUE SHIELD | Attending: Family | Primary: Family

## 2018-06-07 DIAGNOSIS — Z0000 Encounter for general adult medical examination without abnormal findings: Secondary | ICD-10-CM

## 2018-06-07 MED ORDER — AMOXICILLIN CLAVULANATE 875 MG-125 MG TAB
875-125 mg | ORAL_TABLET | Freq: Two times a day (BID) | ORAL | 0 refills | Status: AC
Start: 2018-06-07 — End: ?

## 2018-06-07 MED ORDER — METHYLPREDNISOLONE 4 MG TABS IN A DOSE PACK
4 mg | ORAL | 0 refills | Status: AC
Start: 2018-06-07 — End: ?

## 2018-06-07 NOTE — Progress Notes (Signed)
Chief Complaint   Patient presents with   ??? Complete Physical     Patient is not fasting   ??? Sinus Infection     Started 3 days ago, sore throat.  Pressure behind eyes        Alexander Howell is a 47 y.o. male   who is here for a physical.  States diet has been balanced including fruits, vegetables and proteins. Patient is  exercising regularly.  Reports occasional alcohol use and does not use tobacco products.  Does see the dentist regularly and has kept up with opthalmologic exams as appropriate.  When patient is out in sun for extended periods of time sun block is  used. Reports stable mood.  ROS below for purpose of exam today .     States that sinus issues ongoing x 3 days, with sore throat, pressure behind his ears. Denies fever.     Past Medical History:   Diagnosis Date   ??? Allergic rhinitis, cause unspecified    ??? Essential hypertension, benign    ??? Mixed hyperlipidemia    ??? Sleep apnea        Current Outpatient Medications   Medication Sig Dispense Refill   ??? ciclopirox (LOPROX) 0.77 % topical cream Apply  to affected area two (2) times a day. 30 g 1   ??? terbinafine HCl (LAMISIL) 250 mg tablet Take 1 Tab by mouth daily. 15 Tab 0   ??? escitalopram oxalate (LEXAPRO) 20 mg tablet TAKE 1 TABLET BY MOUTH ONCE DAILY 90 Tab 3   ??? escitalopram oxalate (LEXAPRO) 20 mg tablet Take 1 Tab by mouth daily. 90 Tab 3   ??? valACYclovir (VALTREX) 500 mg tablet Take 1 Tab by mouth daily. 90 Tab 3   ??? atorvastatin (LIPITOR) 40 mg tablet Take 1 Tab by mouth daily. TAKE ONE TABLET BY MOUTH ONCE DAILY 90 Tab 3   ??? lisinopril (PRINIVIL, ZESTRIL) 5 mg tablet Take 1 Tab by mouth daily. 90 Tab 3   ??? cpap machine kit Needs new mask, hose, head gear, and filter for cpap therapy. 1 Kit 0   ??? cpap machine kit Product Unit Price Quantity Total   AirFit??? N20 Nasal CPAP Mask with Headgear - Medium $99.00 1 $99.00   Return Insurance For AirFit??? N20 Nasal CPAP Mask with Headgear $0.00 1 $0.00    White 6 Foot Performance 42m Tubing with 220mEasy Grip Cuffs $12.95 1 $12.95 1 Kit 0   ??? fluticasone (FLONASE) 50 mcg/actuation nasal spray 2 Sprays by Both Nostrils route daily. 3 Bottle 3       Past Surgical History:   Procedure Laterality Date   ??? HX OTHER SURGICAL  09/2016    dental implant       Family History   Problem Relation Age of Onset   ??? Hypertension Mother    ??? High Cholesterol Mother    ??? Hypertension Father    ??? High Cholesterol Father    ??? Diabetes Maternal Aunt    ??? Heart Disease Paternal Uncle    ??? Heart Disease Paternal Grandfather        No Known Allergies    Social History     Socioeconomic History   ??? Marital status: LEGALLY SEPARATED     Spouse name: Not on file   ??? Number of children: Not on file   ??? Years of education: Not on file   ??? Highest education level: Not on file   Occupational History   ???  Not on file   Social Needs   ??? Financial resource strain: Not on file   ??? Food insecurity:     Worry: Not on file     Inability: Not on file   ??? Transportation needs:     Medical: Not on file     Non-medical: Not on file   Tobacco Use   ??? Smoking status: Never Smoker   ??? Smokeless tobacco: Never Used   Substance and Sexual Activity   ??? Alcohol use: Yes     Alcohol/week: 0.0 standard drinks     Comment: monthly or less; 1-2 drinks   ??? Drug use: Not on file   ??? Sexual activity: Not on file   Lifestyle   ??? Physical activity:     Days per week: Not on file     Minutes per session: Not on file   ??? Stress: Not on file   Relationships   ??? Social connections:     Talks on phone: Not on file     Gets together: Not on file     Attends religious service: Not on file     Active member of club or organization: Not on file     Attends meetings of clubs or organizations: Not on file     Relationship status: Not on file   ??? Intimate partner violence:     Fear of current or ex partner: Not on file     Emotionally abused: Not on file     Physically abused: Not on file     Forced sexual activity: Not on file    Other Topics Concern   ??? Not on file   Social History Narrative   ??? Not on file       Review of Systems - General ROS: negative for - chills, fatigue, fever, malaise, night sweats, weight gain or weight loss  Ophthalmic ROS: negative for - decreased vision, eye pain or loss of vision  ENT ROS: negative for - epistaxis, headaches, hearing change, nasal congestion, sinus pain, sore throat or tinnitus  Allergy and Immunology ROS: negative for - hives, itchy/watery eyes, nasal congestion or seasonal allergies  Hematological and Lymphatic ROS: negative for - bleeding problems, bruising, night sweats, swollen lymph nodes or weight loss  Endocrine ROS: negative for - breast changes, galactorrhea, hot flashes, mood swings, polydipsia/polyuria or unexpected weight changes  Breast ROS: negative for - changes  Respiratory ROS: negative for - cough, orthopnea, shortness of breath, tachypnea or wheezing  Cardiovascular ROS: negative for - chest pain, dyspnea on exertion, edema, irregular heartbeat, loss of consciousness, orthopnea, palpitations, paroxysmal nocturnal dyspnea, rapid heart rate or shortness of breath  Gastrointestinal ROS: negative for - abdominal pain, appetite loss, blood in stools, change in bowel habits, change in stools, constipation, diarrhea or heartburn  Genito-Urinary ROS: negative for - urinary hesitancy, urinary frequency, weak urinary stream, testicle lumps or pain, penile discharge  Musculoskeletal ROS: negative for - gait disturbance, joint pain, joint stiffness, joint swelling or muscular weakness  Neurological ROS: negative for - gait disturbance, headaches, impaired coordination/balance, memory loss, numbness/tingling, seizures, tremors or weakness  Dermatological ROS: negative for - dry skin, eczema, hair changes, mole changes, pruritus or rash        Vitals:    06/07/18 0817   BP: 130/85   Pulse: 66   Temp: 97.7 ??F (36.5 ??C)   SpO2: 97%   Weight: 218 lb (98.9 kg)   Height: 5' 7.5" (1.715 m)  Physical Examination: General appearance - alert, well appearing, and in no distress, oriented to person, place, and time, normal appearing weight and acyanotic, in no respiratory distress  Mental status - alert, oriented to person, place, and time, normal mood, behavior, speech, dress, motor activity, and thought processes  Eyes - pupils equal and reactive, extraocular eye movements intact, sclera anicteric  Ears - bilateral TM's and external ear canals normal  Nose - normal and patent, no erythema, discharge or polyps  Mouth - mucous membranes moist, pharynx normal without lesions, tonsils normal, dental hygiene good and tongue normal  Neck - supple, no significant adenopathy, carotids upstroke normal bilaterally, no bruits,   Thyroid -  thyroid is normal in size without nodules or tenderness  Lymphatics - no palpable lymphadenopathy, no hepatosplenomegaly  Chest - clear to auscultation, no wheezes, rales or rhonchi, symmetric air entry  Heart - normal rate, regular rhythm, normal S1, S2, no murmurs, rubs, clicks or gallops  Abdomen - soft, nontender, nondistended, no masses or organomegaly  GU - Normal genitalia, no testicular masses or hernias, Prostate Normal  Back exam - full range of motion, no tenderness, palpable spasm or pain on motion  Neurological - alert, oriented, normal speech, no focal findings or movement disorder noted  Musculoskeletal - no joint tenderness, deformity or swelling  Extremities - peripheral pulses normal, no pedal edema, no clubbing or cyanosis  Skin - normal coloration and turgor, no rashes, no suspicious skin lesions noted      Assessment/plan  Wellness visit- Overall appears to be in good physical condition.  Routine screening labs obtained and will be reviewed upon return. Encouraged to start or maintain healthy diet and or routine exercise. Reviewed and discussed appropriate vaccines and screenings and administered needed  vaccines or referrals for screening.  Reviewed safety issues common for age bracket. Fecal occult blood cards were not provided to patient for completion with explanation for their use.        Loretha Brasil, NP                Discussed the patient's BMI with him.  The BMI follow up plan is as follows:     dietary management education, guidance, and counseling  encourage exercise  monitor weight  prescribed dietary intake    An After Visit Summary was printed and given to the patient.

## 2018-06-07 NOTE — Progress Notes (Signed)
Cholesterol is ok, make sure taking cholesterol med daly.   Thyroid normal  Kidney, liver, electrolytes are normal, glucose slightly up.   Blood counts are normal  Prostate normal  i'm going to add a hgba1c to be sure (avg blood sugar test), should know it tomorrow.

## 2018-06-07 NOTE — Progress Notes (Signed)
a1c is 5.5, so that is good.

## 2018-06-07 NOTE — Progress Notes (Signed)
Chief Complaint   Patient presents with   ??? Complete Physical     Patient is not fasting   ??? Sinus Infection     Started 3 days ago, sore throat.  Pressure behind eyes        Alexander Howell is a 47 y.o. male   who is here for a physical.  States diet has been balanced including fruits, vegetables and proteins. Patient is  exercising regularly.  Reports occasional alcohol use and does not use tobacco products.  Does see the dentist regularly and has kept up with opthalmologic exams as appropriate.  When patient is out in sun for extended periods of time sun block is  used. Reports stable mood.  ROS below for purpose of exam today .     States that sinus issues ongoing x 3 days, with sore throat, pressure behind his ears. Denies fever.     Past Medical History:   Diagnosis Date   ??? Allergic rhinitis, cause unspecified    ??? Essential hypertension, benign    ??? Mixed hyperlipidemia    ??? Sleep apnea        Current Outpatient Medications   Medication Sig Dispense Refill   ??? ciclopirox (LOPROX) 0.77 % topical cream Apply  to affected area two (2) times a day. 30 g 1   ??? terbinafine HCl (LAMISIL) 250 mg tablet Take 1 Tab by mouth daily. 15 Tab 0   ??? escitalopram oxalate (LEXAPRO) 20 mg tablet TAKE 1 TABLET BY MOUTH ONCE DAILY 90 Tab 3   ??? escitalopram oxalate (LEXAPRO) 20 mg tablet Take 1 Tab by mouth daily. 90 Tab 3   ??? valACYclovir (VALTREX) 500 mg tablet Take 1 Tab by mouth daily. 90 Tab 3   ??? atorvastatin (LIPITOR) 40 mg tablet Take 1 Tab by mouth daily. TAKE ONE TABLET BY MOUTH ONCE DAILY 90 Tab 3   ??? lisinopril (PRINIVIL, ZESTRIL) 5 mg tablet Take 1 Tab by mouth daily. 90 Tab 3   ??? cpap machine kit Needs new mask, hose, head gear, and filter for cpap therapy. 1 Kit 0   ??? cpap machine kit Product Unit Price Quantity Total   AirFit??? N20 Nasal CPAP Mask with Headgear - Medium $99.00 1 $99.00   Return Insurance For AirFit??? N20 Nasal CPAP Mask with Headgear $0.00 1 $0.00   White 6 Foot Performance 45m Tubing with 25m Easy Grip Cuffs $12.95 1 $12.95 1 Kit 0   ??? fluticasone (FLONASE) 50 mcg/actuation nasal spray 2 Sprays by Both Nostrils route daily. 3 Bottle 3       Past Surgical History:   Procedure Laterality Date   ??? HX OTHER SURGICAL  09/2016    dental implant       Family History   Problem Relation Age of Onset   ??? Hypertension Mother    ??? High Cholesterol Mother    ??? Hypertension Father    ??? High Cholesterol Father    ??? Diabetes Maternal Aunt    ??? Heart Disease Paternal Uncle    ??? Heart Disease Paternal Grandfather        No Known Allergies    Social History     Socioeconomic History   ??? Marital status: LEGALLY SEPARATED     Spouse name: Not on file   ??? Number of children: Not on file   ??? Years of education: Not on file   ??? Highest education level: Not on file   Occupational History   ???  Not on file   Social Needs   ??? Financial resource strain: Not on file   ??? Food insecurity:     Worry: Not on file     Inability: Not on file   ??? Transportation needs:     Medical: Not on file     Non-medical: Not on file   Tobacco Use   ??? Smoking status: Never Smoker   ??? Smokeless tobacco: Never Used   Substance and Sexual Activity   ??? Alcohol use: Yes     Alcohol/week: 0.0 standard drinks     Comment: monthly or less; 1-2 drinks   ??? Drug use: Not on file   ??? Sexual activity: Not on file   Lifestyle   ??? Physical activity:     Days per week: Not on file     Minutes per session: Not on file   ??? Stress: Not on file   Relationships   ??? Social connections:     Talks on phone: Not on file     Gets together: Not on file     Attends religious service: Not on file     Active member of club or organization: Not on file     Attends meetings of clubs or organizations: Not on file     Relationship status: Not on file   ??? Intimate partner violence:     Fear of current or ex partner: Not on file     Emotionally abused: Not on file     Physically abused: Not on file     Forced sexual activity: Not on file   Other Topics Concern   ??? Not on file   Social  History Narrative   ??? Not on file       Review of Systems - General ROS: negative for - chills, fatigue, fever, malaise, night sweats, weight gain or weight loss  Ophthalmic ROS: negative for - decreased vision, eye pain or loss of vision  ENT ROS: negative for - epistaxis, headaches, hearing change, nasal congestion, sinus pain, sore throat or tinnitus  Allergy and Immunology ROS: negative for - hives, itchy/watery eyes, nasal congestion or seasonal allergies  Hematological and Lymphatic ROS: negative for - bleeding problems, bruising, night sweats, swollen lymph nodes or weight loss  Endocrine ROS: negative for - breast changes, galactorrhea, hot flashes, mood swings, polydipsia/polyuria or unexpected weight changes  Breast ROS: negative for - changes  Respiratory ROS: negative for - cough, orthopnea, shortness of breath, tachypnea or wheezing  Cardiovascular ROS: negative for - chest pain, dyspnea on exertion, edema, irregular heartbeat, loss of consciousness, orthopnea, palpitations, paroxysmal nocturnal dyspnea, rapid heart rate or shortness of breath  Gastrointestinal ROS: negative for - abdominal pain, appetite loss, blood in stools, change in bowel habits, change in stools, constipation, diarrhea or heartburn  Genito-Urinary ROS: negative for - urinary hesitancy, urinary frequency, weak urinary stream, testicle lumps or pain, penile discharge  Musculoskeletal ROS: negative for - gait disturbance, joint pain, joint stiffness, joint swelling or muscular weakness  Neurological ROS: negative for - gait disturbance, headaches, impaired coordination/balance, memory loss, numbness/tingling, seizures, tremors or weakness  Dermatological ROS: negative for - dry skin, eczema, hair changes, mole changes, pruritus or rash        Vitals:    06/07/18 0817   BP: 130/85   Pulse: 66   Temp: 97.7 ??F (36.5 ??C)   SpO2: 97%   Weight: 218 lb (98.9 kg)   Height: 5' 7.5" (1.715 m)  Physical Examination: General appearance -  alert, well appearing, and in no distress, oriented to person, place, and time, normal appearing weight and acyanotic, in no respiratory distress  Mental status - alert, oriented to person, place, and time, normal mood, behavior, speech, dress, motor activity, and thought processes  Eyes - pupils equal and reactive, extraocular eye movements intact, sclera anicteric  Ears - bilateral TM's and external ear canals normal  Nose - normal and patent, no erythema, discharge or polyps  Mouth - mucous membranes moist, pharynx normal without lesions, tonsils normal, dental hygiene good and tongue normal  Neck - supple, no significant adenopathy, carotids upstroke normal bilaterally, no bruits,   Thyroid -  thyroid is normal in size without nodules or tenderness  Lymphatics - no palpable lymphadenopathy, no hepatosplenomegaly  Chest - clear to auscultation, no wheezes, rales or rhonchi, symmetric air entry  Heart - normal rate, regular rhythm, normal S1, S2, no murmurs, rubs, clicks or gallops  Abdomen - soft, nontender, nondistended, no masses or organomegaly  GU - Normal genitalia, no testicular masses or hernias, Prostate Normal  Back exam - full range of motion, no tenderness, palpable spasm or pain on motion  Neurological - alert, oriented, normal speech, no focal findings or movement disorder noted  Musculoskeletal - no joint tenderness, deformity or swelling  Extremities - peripheral pulses normal, no pedal edema, no clubbing or cyanosis  Skin - normal coloration and turgor, no rashes, no suspicious skin lesions noted      Assessment/plan  Wellness visit- Overall appears to be in good physical condition.  Routine screening labs obtained and will be reviewed upon return. Encouraged to start or maintain healthy diet and or routine exercise. Reviewed and discussed appropriate vaccines and screenings and administered needed vaccines or referrals for screening.  Reviewed safety issues common for age bracket. Fecal occult  blood cards were not provided to patient for completion with explanation for their use.        Loretha Brasil, NP                Discussed the patient's BMI with him.  The BMI follow up plan is as follows:     dietary management education, guidance, and counseling  encourage exercise  monitor weight  prescribed dietary intake    An After Visit Summary was printed and given to the patient.

## 2018-06-07 NOTE — Progress Notes (Signed)
Cholesterol is ok, make sure taking cholesterol med daly.   Thyroid normal  Kidney, liver, electrolytes are normal, glucose slightly up.   Blood counts are normal  Prostate normal  i'm going to add a hgba1c to be sure (avg blood sugar test), should know it tomorrow.

## 2018-06-07 NOTE — Patient Instructions (Signed)
Body Mass Index: Care Instructions  Your Care Instructions    Body mass index (BMI) can help you see if your weight is raising your risk for health problems. It uses a formula to compare how much you weigh with how tall you are.  ?? A BMI lower than 18.5 is considered underweight.  ?? A BMI between 18.5 and 24.9 is considered healthy.  ?? A BMI between 25 and 29.9 is considered overweight. A BMI of 30 or higher is considered obese.  If your BMI is in the normal range, it means that you have a lower risk for weight-related health problems. If your BMI is in the overweight or obese range, you may be at increased risk for weight-related health problems, such as high blood pressure, heart disease, stroke, arthritis or joint pain, and diabetes. If your BMI is in the underweight range, you may be at increased risk for health problems such as fatigue, lower protection (immunity) against illness, muscle loss, bone loss, hair loss, and hormone problems.  BMI is just one measure of your risk for weight-related health problems. You may be at higher risk for health problems if you are not active, you eat an unhealthy diet, or you drink too much alcohol or use tobacco products.  Follow-up care is a key part of your treatment and safety. Be sure to make and go to all appointments, and call your doctor if you are having problems. It's also a good idea to know your test results and keep a list of the medicines you take.  How can you care for yourself at home?  ?? Practice healthy eating habits. This includes eating plenty of fruits, vegetables, whole grains, lean protein, and low-fat dairy.  ?? If your doctor recommends it, get more exercise. Walking is a good choice. Bit by bit, increase the amount you walk every day. Try for at least 30 minutes on most days of the week.  ?? Do not smoke. Smoking can increase your risk for health problems. If you need help quitting, talk to your doctor about stop-smoking programs and  medicines. These can increase your chances of quitting for good.  ?? Limit alcohol to 2 drinks a day for men and 1 drink a day for women. Too much alcohol can cause health problems.  If you have a BMI higher than 25  ?? Your doctor may do other tests to check your risk for weight-related health problems. This may include measuring the distance around your waist. A waist measurement of more than 40 inches in men or 35 inches in women can increase the risk of weight-related health problems.  ?? Talk with your doctor about steps you can take to stay healthy or improve your health. You may need to make lifestyle changes to lose weight and stay healthy, such as changing your diet and getting regular exercise.  If you have a BMI lower than 18.5  ?? Your doctor may do other tests to check your risk for health problems.  ?? Talk with your doctor about steps you can take to stay healthy or improve your health. You may need to make lifestyle changes to gain or maintain weight and stay healthy, such as getting more healthy foods in your diet and doing exercises to build muscle.  Where can you learn more?  Go to http://www.healthwise.net/[employer].  Enter S176 in the search box to learn more about "Body Mass Index: Care Instructions."  Current as of: April 19, 2015  Content Version: 11.4  ??   2006-2017 [employer]. Care instructions adapted under license by [employer] (which disclaims liability or warranty for this information). If you have questions about a medical condition or this instruction, always ask your healthcare professional. [employer] disclaims any warranty or liability for your use of this information.

## 2018-06-07 NOTE — Progress Notes (Signed)
a1c is 5.5, so that is good.

## 2018-06-09 ENCOUNTER — Other Ambulatory Visit: Admit: 2018-06-09 | Discharge: 2018-06-09 | Payer: BLUE CROSS/BLUE SHIELD | Primary: Family

## 2018-06-09 LAB — AMB POC URINALYSIS DIP STICK AUTO W/ MICRO (MICRO RESULTS)
Bilirubin (UA POC): NEGATIVE
Bilirubin, Urine, POC: NEGATIVE
Blood (UA POC): NEGATIVE
Blood (UA POC): NEGATIVE
Crystals (UA POC): NEGATIVE
Crystals (UA POC): NEGATIVE
Glucose (UA POC): NEGATIVE
Glucose, Urine, POC: NEGATIVE
Ketones (UA POC): NEGATIVE
Ketones, Urine, POC: NEGATIVE
Leukocyte Esterase, Urine, POC: NEGATIVE
Leukocyte esterase (UA POC): NEGATIVE
Nitrite, Urine, POC: NEGATIVE
Nitrites (UA POC): NEGATIVE
Protein (UA POC): NEGATIVE
Protein, Urine, POC: NEGATIVE
RBC, Urine, POC: 0
RBCs (UA POC): 0
Specific Gravity, Urine, POC: 1.03 NA (ref 1.003–1.035)
Specific gravity (UA POC): 1.03 (ref 1.003–1.035)
Urobilinogen (UA POC): 0.2 (ref 0.2–1)
Urobilinogen, POC: 0.2 (ref 0.2–1)
pH (UA POC): 5.5 (ref 4.6–8.0)
pH, Urine, POC: 5.5 NA (ref 4.6–8.0)

## 2018-06-09 NOTE — Progress Notes (Signed)
Blood drawn from 06/07/18 order

## 2018-06-09 NOTE — Progress Notes (Signed)
Blood drawn from 06/07/18 order

## 2018-06-10 LAB — CBC WITH AUTOMATED DIFF
ABS. BASOPHILS: 0 10*3/uL (ref 0.0–0.2)
ABS. EOSINOPHILS: 0 10*3/uL (ref 0.0–0.4)
ABS. IMM. GRANS.: 0 10*3/uL (ref 0.0–0.1)
ABS. MONOCYTES: 0.6 10*3/uL (ref 0.1–0.9)
ABS. NEUTROPHILS: 6.7 10*3/uL (ref 1.4–7.0)
Abs Lymphocytes: 1.4 10*3/uL (ref 0.7–3.1)
BASOPHILS: 0 %
EOSINOPHILS: 0 %
HCT: 45.2 % (ref 37.5–51.0)
HGB: 15.9 g/dL (ref 13.0–17.7)
IMMATURE GRANULOCYTES: 0 %
Lymphocytes: 15 %
MCH: 31.9 pg (ref 26.6–33.0)
MCHC: 35.2 g/dL (ref 31.5–35.7)
MCV: 91 fL (ref 79–97)
MONOCYTES: 7 %
NEUTROPHILS: 78 %
PLATELET: 258 10*3/uL (ref 150–450)
RBC: 4.99 x10E6/uL (ref 4.14–5.80)
RDW: 12.5 % (ref 12.3–15.4)
WBC: 8.7 10*3/uL (ref 3.4–10.8)

## 2018-06-10 LAB — TSH 3RD GENERATION
TSH: 0.946 u[IU]/mL (ref 0.450–4.500)
TSH: 0.946 u[IU]/mL (ref 0.450–4.500)

## 2018-06-10 LAB — METABOLIC PANEL, COMPREHENSIVE
A-G Ratio: 2.2 (ref 1.2–2.2)
ALT (SGPT): 37 IU/L (ref 0–44)
AST (SGOT): 24 IU/L (ref 0–40)
Albumin: 4.9 g/dL (ref 3.5–5.5)
Alk. phosphatase: 53 IU/L (ref 39–117)
BUN/Creatinine ratio: 12 (ref 9–20)
BUN: 13 mg/dL (ref 6–24)
Bilirubin, total: 0.8 mg/dL (ref 0.0–1.2)
CO2: 17 mmol/L — ABNORMAL LOW (ref 20–29)
Calcium: 9.8 mg/dL (ref 8.7–10.2)
Chloride: 99 mmol/L (ref 96–106)
Creatinine: 1.08 mg/dL (ref 0.76–1.27)
GFR est AA: 93 mL/min/{1.73_m2} (ref >59)
GFR est non-AA: 80 mL/min/{1.73_m2} (ref >59)
GLOBULIN, TOTAL: 2.2 g/dL (ref 1.5–4.5)
Glucose: 109 mg/dL — ABNORMAL HIGH (ref 65–99)
Potassium: 4.4 mmol/L (ref 3.5–5.2)
Protein, total: 7.1 g/dL (ref 6.0–8.5)
Sodium: 138 mmol/L (ref 134–144)

## 2018-06-10 LAB — LIPID PANEL
Cholesterol, Total: 196 mg/dL (ref 100–199)
Cholesterol, total: 196 mg/dL (ref 100–199)
HDL Cholesterol: 62 mg/dL (ref >39)
HDL: 62 mg/dL (ref >39)
LDL Calculated: 113 mg/dL — ABNORMAL HIGH (ref 0–99)
LDL, calculated: 113 mg/dL — ABNORMAL HIGH (ref 0–99)
Triglyceride: 107 mg/dL (ref 0–149)
Triglycerides: 107 mg/dL (ref 0–149)
VLDL Cholesterol Calculated: 21 mg/dL (ref 5–40)
VLDL, calculated: 21 mg/dL (ref 5–40)

## 2018-06-10 LAB — PSA, DIAGNOSTIC (PROSTATE SPECIFIC AG): Prostate Specific Ag: 0.2 ng/mL (ref 0.0–4.0)

## 2018-06-10 LAB — COMPREHENSIVE METABOLIC PANEL
ALT: 37 IU/L (ref 0–44)
AST: 24 IU/L (ref 0–40)
Albumin/Globulin Ratio: 2.2 NA (ref 1.2–2.2)
Albumin: 4.9 g/dL (ref 3.5–5.5)
Alkaline Phosphatase: 53 IU/L (ref 39–117)
BUN: 13 mg/dL (ref 6–24)
Bun/Cre Ratio: 12 NA (ref 9–20)
CO2: 17 mmol/L — ABNORMAL LOW (ref 20–29)
Calcium: 9.8 mg/dL (ref 8.7–10.2)
Chloride: 99 mmol/L (ref 96–106)
Creatinine: 1.08 mg/dL (ref 0.76–1.27)
EGFR IF NonAfrican American: 80 mL/min/{1.73_m2} (ref >59)
GFR African American: 93 mL/min/{1.73_m2} (ref >59)
Globulin, Total: 2.2 g/dL (ref 1.5–4.5)
Glucose: 109 mg/dL — ABNORMAL HIGH (ref 65–99)
Potassium: 4.4 mmol/L (ref 3.5–5.2)
Sodium: 138 mmol/L (ref 134–144)
Total Bilirubin: 0.8 mg/dL (ref 0.0–1.2)
Total Protein: 7.1 g/dL (ref 6.0–8.5)

## 2018-06-10 LAB — CBC WITH AUTO DIFFERENTIAL
Basophils %: 0 %
Basophils Absolute: 0 10*3/uL (ref 0.0–0.2)
Eosinophils %: 0 %
Eosinophils Absolute: 0 10*3/uL (ref 0.0–0.4)
Granulocyte Absolute Count: 0 10*3/uL (ref 0.0–0.1)
Hematocrit: 45.2 % (ref 37.5–51.0)
Hemoglobin: 15.9 g/dL (ref 13.0–17.7)
Immature Granulocytes: 0 %
Lymphocytes %: 15 %
Lymphocytes Absolute: 1.4 10*3/uL (ref 0.7–3.1)
MCH: 31.9 pg (ref 26.6–33.0)
MCHC: 35.2 g/dL (ref 31.5–35.7)
MCV: 91 fL (ref 79–97)
Monocytes %: 7 %
Monocytes Absolute: 0.6 10*3/uL (ref 0.1–0.9)
Neutrophils %: 78 %
Neutrophils Absolute: 6.7 10*3/uL (ref 1.4–7.0)
Platelets: 258 10*3/uL (ref 150–450)
RBC: 4.99 x10E6/uL (ref 4.14–5.80)
RDW: 12.5 % (ref 12.3–15.4)
WBC: 8.7 10*3/uL (ref 3.4–10.8)

## 2018-06-10 LAB — PSA PROSTATIC SPECIFIC ANTIGEN: PSA: 0.2 ng/mL (ref 0.0–4.0)

## 2018-06-11 LAB — HEMOGLOBIN A1C W/O EAG
Hemoglobin A1C: 5.5 % (ref 4.8–5.6)
Hemoglobin A1c: 5.5 % (ref 4.8–5.6)

## 2018-06-11 LAB — SPECIMEN STATUS REPORT

## 2018-07-08 MED ORDER — ATORVASTATIN 40 MG TAB
40 mg | ORAL_TABLET | Freq: Every day | ORAL | 3 refills | Status: AC
Start: 2018-07-08 — End: ?

## 2018-07-08 MED ORDER — LISINOPRIL 5 MG TAB
5 mg | ORAL_TABLET | Freq: Every day | ORAL | 3 refills | Status: AC
Start: 2018-07-08 — End: ?

## 2018-07-08 NOTE — Telephone Encounter (Signed)
From: Eustace Pen  To: Earney Hamburg, NP  Sent: 07/08/2018 12:23 PM EST  Subject: Prescription Question    I need my statin and lisinopril refilled. I have moved to [place], [place]. I need the refill request sent to;    [facility]  [address]  [place], [place] [address]    Their phone number is [phone]    Thanks

## 2018-09-29 MED ORDER — MELOXICAM 15 MG TAB
15 mg | ORAL_TABLET | Freq: Every day | ORAL | 5 refills | Status: AC
Start: 2018-09-29 — End: ?

## 2018-09-29 NOTE — Telephone Encounter (Signed)
From: Eustace Pen  To: Earney Hamburg, NP  Sent: 09/29/2018 12:21 PM EDT  Subject: Prescription Question    You had prescribed Mobic/meloxicam to me a few years ago for my back pain. I have finally ran out of that prescription. Can you please send in another? I have also moved to [place], [place] and I need it sent to [facility] at this address. Thanks    [address]  [place], [place] [address]  [place]

## 2018-09-30 ENCOUNTER — Encounter

## 2018-09-30 MED ORDER — VALACYCLOVIR 500 MG TAB
500 mg | ORAL_TABLET | Freq: Every day | ORAL | 3 refills | Status: AC
Start: 2018-09-30 — End: ?

## 2019-01-11 NOTE — Telephone Encounter (Signed)
From: Eustace Pen  To: Earney Hamburg, NP  Sent: 01/11/2019 3:04 PM EDT  Subject: Non-Urgent Medical Question    I need a letter stating that I have a medical condition that requires me to exercise regularly. Anxiety, high blood pressure, high cholesterol, my BMI is high, whatever. My gym has reopened but it is conditional on me having a letter stating medical need. Thx

## 2019-06-09 ENCOUNTER — Encounter: Attending: Family | Primary: Family

## 2019-06-28 ENCOUNTER — Ambulatory Visit: Payer: 59 | Attending: Internal Medicine

## 2019-06-28 DIAGNOSIS — Z20822 Contact with and (suspected) exposure to covid-19: Secondary | ICD-10-CM

## 2019-06-30 LAB — NOVEL CORONAVIRUS, NAA: SARS-CoV-2, NAA: NOT DETECTED

## 2019-08-29 ENCOUNTER — Ambulatory Visit: Payer: 59 | Attending: Internal Medicine

## 2019-08-29 DIAGNOSIS — Z20822 Contact with and (suspected) exposure to covid-19: Secondary | ICD-10-CM

## 2019-08-30 LAB — NOVEL CORONAVIRUS, NAA: SARS-CoV-2, NAA: NOT DETECTED

## 2019-09-05 ENCOUNTER — Ambulatory Visit: Payer: 59 | Attending: Internal Medicine

## 2019-09-05 DIAGNOSIS — Z20822 Contact with and (suspected) exposure to covid-19: Secondary | ICD-10-CM

## 2019-09-07 ENCOUNTER — Emergency Department (HOSPITAL_BASED_OUTPATIENT_CLINIC_OR_DEPARTMENT_OTHER)
Admission: EM | Admit: 2019-09-07 | Discharge: 2019-09-08 | Disposition: A | Discharge status: Home / Self Care | Payer: 59 | Attending: Emergency Medicine | Admitting: Emergency Medicine

## 2019-09-07 ENCOUNTER — Encounter (HOSPITAL_BASED_OUTPATIENT_CLINIC_OR_DEPARTMENT_OTHER): Payer: Self-pay

## 2019-09-07 ENCOUNTER — Emergency Department (HOSPITAL_BASED_OUTPATIENT_CLINIC_OR_DEPARTMENT_OTHER): Payer: 59

## 2019-09-07 ENCOUNTER — Other Ambulatory Visit: Payer: Self-pay

## 2019-09-07 DIAGNOSIS — U071 COVID-19: Secondary | ICD-10-CM | POA: Diagnosis not present

## 2019-09-07 DIAGNOSIS — J069 Acute upper respiratory infection, unspecified: Secondary | ICD-10-CM | POA: Diagnosis not present

## 2019-09-07 DIAGNOSIS — R05 Cough: Secondary | ICD-10-CM | POA: Diagnosis present

## 2019-09-07 LAB — NOVEL CORONAVIRUS, NAA: SARS-CoV-2, NAA: DETECTED — AB

## 2019-09-07 IMAGING — DX DG CHEST 1V PORT
1 series · 1 of 1 positions shown · non-contrast
Comparison: None.

CLINICAL DATA: [VT] positivity

EXAM:
PORTABLE CHEST 1 VIEW

[chest ap]
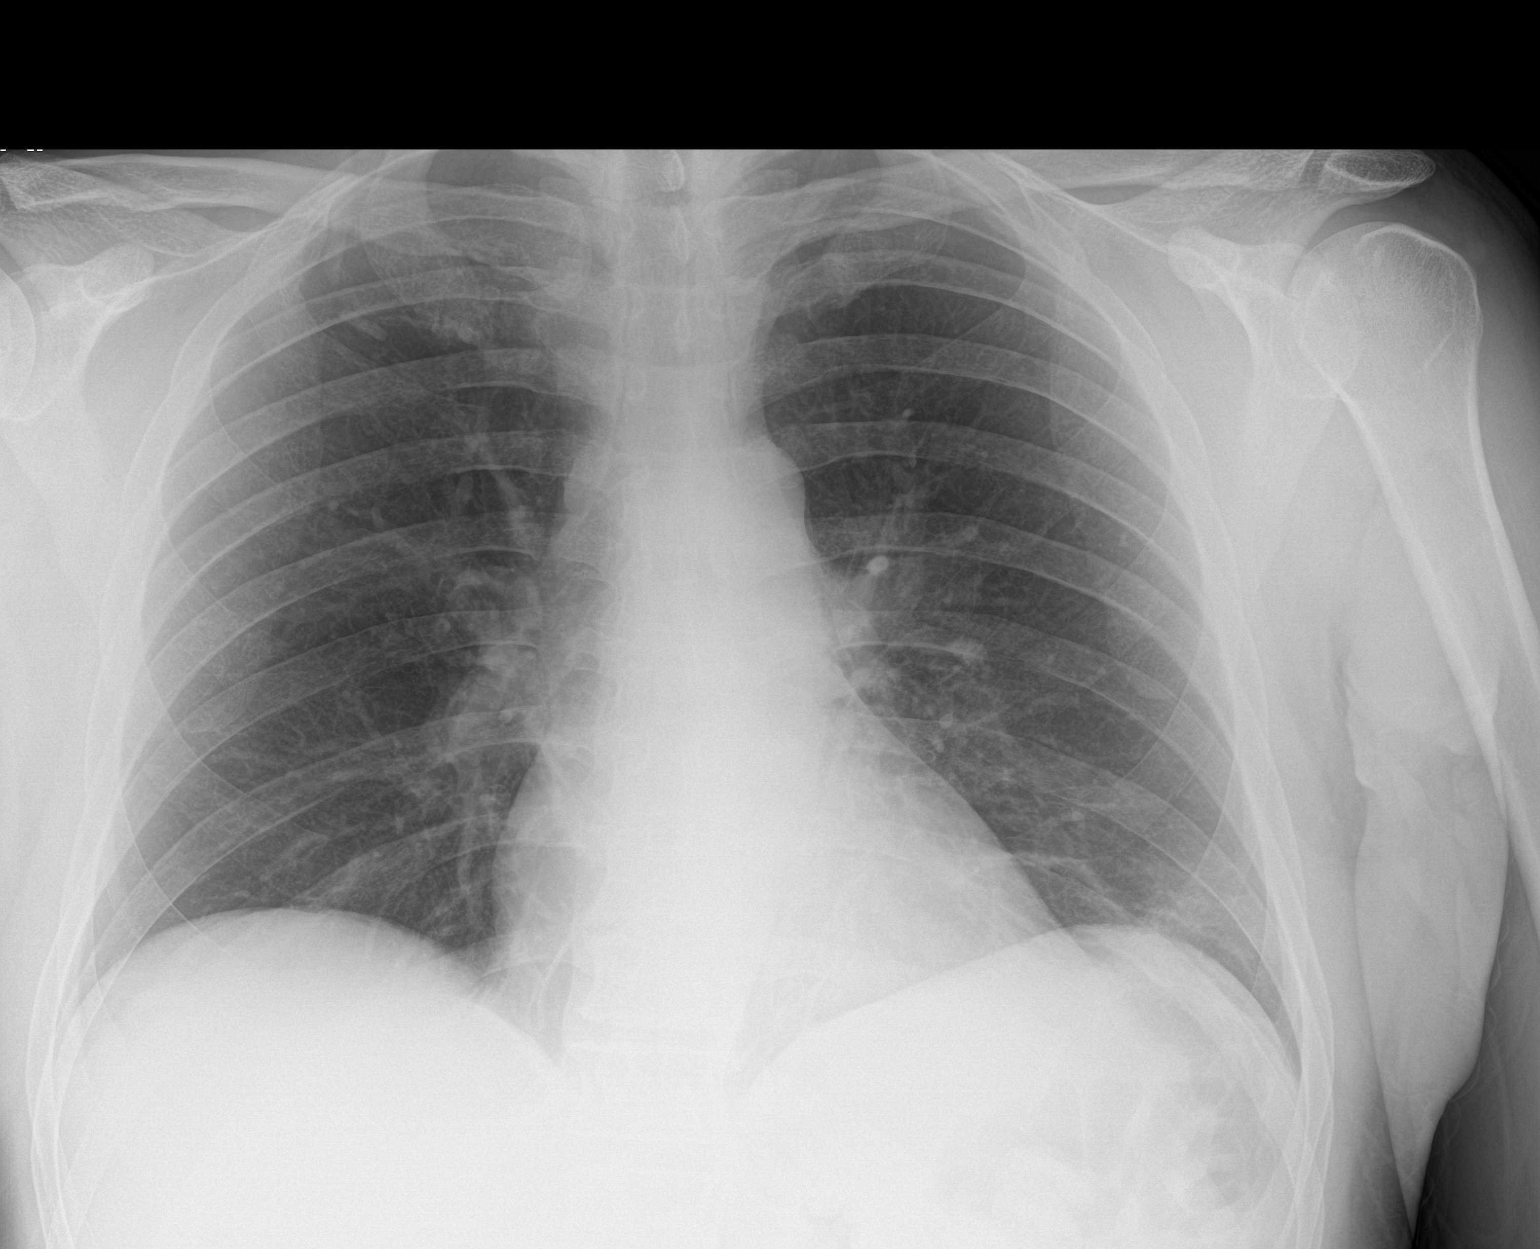

[1 of 1 positions shown; findings below may reference images not displayed]

FINDINGS: Cardiac shadows within normal limits. Patchy atelectatic changes are
noted in the left base. No bony abnormality is seen. No sizable
effusion is noted.
IMPRESSION: Patchy atelectatic changes in the left base.

## 2019-09-07 MED ORDER — HYDROCOD POLST-CPM POLST ER 10-8 MG/5ML PO SUER: 5.0000 mL | Freq: Two times a day (BID) | ORAL | 0 refills | Status: DC | PRN

## 2019-09-07 MED ORDER — HYDROCOD POLST-CPM POLST ER 10-8 MG/5ML PO SUER
5.0000 mL | Freq: Once | ORAL | Status: AC
  Administered 2019-09-08: 5 mL via ORAL
  Filled 2019-09-07: qty 5

## 2019-09-07 NOTE — ED Triage Notes (Addendum)
Pt was evaluated on 2/25 for fever, body aches, loss of smell, productive cough. Pt has a positive COVID test. Pt reports increased coughing and "crackles" in his lungs. Denies ShOB. Pt's first symptoms started on 2/23.

## 2019-09-07 NOTE — ED Provider Notes (Signed)
[facility] Provider Note: Georgena Spurling, MD, FACEP  CSN: [id number] MRN: [id number] ARRIVAL: 09/07/19 at 2258 ROOM: [place]  09/07/19 11:33 PM LEBERT Raymond Kelly is a 49 y.o. male has had symptoms of COVID-19 since 08/30/2019.  Specifically he has had fever, body aches, loss of smell and productive cough.  He tested positive for Covid.  He is here complaining of increased coughing and "crackles" in his lungs.  He denies shortness of breath and he was not hypoxic or tachypneic on arrival.  His symptoms are mild and he is actually feeling better today that he had been.  He was placed on amoxicillin 2 days ago for possible pneumonia.   Past Medical History:  Diagnosis Date  . Family history of adverse reaction to anesthesia    mother slow to awaken  . History of kidney stones 2007    Past Surgical History:  Procedure Laterality Date  . CHOLECYSTECTOMY N/A 02/01/2016   Procedure: LAPAROSCOPIC CHOLECYSTECTOMY WITH INTRAOPERATIVE CHOLANGIOGRAM;  Surgeon: Coralie Keens, MD;  Location: [facility];  Service: General;  Laterality: N/A;  . cyst removed     between his front teeth and cyst removed from abdomal area  . ESOPHAGOGASTRODUODENOSCOPY (EGD) WITH PROPOFOL N/A 03/06/2016   Procedure: ESOPHAGOGASTRODUODENOSCOPY (EGD) WITH PROPOFOL;  Surgeon: Doran Stabler, MD;  Location: [facility];  Service: Gastroenterology;  Laterality: N/A;  with stent removal.  . GASTROINTESTINAL STENT REMOVAL N/A 03/06/2016   Procedure: GASTROINTESTINAL STENT REMOVAL;  Surgeon: Doran Stabler, MD;  Location: [facility];  Service: Gastroenterology;  Laterality: N/A;  . TONSILLECTOMY    . ULNAR NERVE TRANSPOSITION      No family history on file.  Social History   Tobacco Use  . Smoking status: Never Smoker  . Smokeless tobacco: Never Used  Substance Use Topics  . Alcohol use: Not Currently    Comment: social at times  . Drug  use: No    Prior to Admission medications   Medication Sig Start Date End Date Taking? Authorizing Provider  amoxicillin (AMOXIL) 125 MG/5ML suspension Take by mouth 3 (three) times daily.   Yes [provider]  chlorpheniramine-HYDROcodone (TUSSIONEX PENNKINETIC ER) 10-8 MG/5ML SUER Take 5 mLs by mouth every 12 (twelve) hours as needed for cough. 09/07/19   Meliton Samad, MD  docusate sodium (COLACE) 100 MG capsule Take 100 mg by mouth 2 (two) times daily as needed for mild constipation.    [provider]  simethicone (MYLICON) 80 MG chewable tablet Chew 80-160 mg by mouth every 6 (six) hours as needed for flatulence.  09/07/19  [provider]    Allergies No known allergies   REVIEW OF SYSTEMS  Negative except as noted here or in the History of Present Illness.   PHYSICAL EXAMINATION  Initial Vital Signs Blood pressure (!) 138/98, pulse 67, temperature 98.4 F (36.9 C), temperature source Oral, resp. rate 16, height 5\' 8"  (1.727 m), weight 87.1 kg, SpO2 97 %.  Examination General: Well-developed, well-nourished male in no acute distress; appearance consistent with age of record HENT: normocephalic; atraumatic Eyes: pupils equal, round and reactive to light; extraocular muscles intact Neck: supple Heart: regular rate and rhythm Lungs: Increased breath sounds in left base Abdomen: soft; nondistended; nontender; bowel sounds present Extremities: No deformity; full range of motion Neurologic: Awake, alert and oriented; motor function intact in all extremities and symmetric; no facial droop Skin: Warm and  dry Psychiatric: Normal mood and affect   RESULTS  Summary of this visit's results, reviewed and interpreted by myself:   EKG Interpretation  Date/Time:    Ventricular Rate:    PR Interval:    QRS Duration:   QT Interval:    QTC Calculation:   R Axis:     Text Interpretation:        Laboratory Studies: No results found for this or any  previous visit (from the past 24 hour(s)). Imaging Studies: DG Chest Portable 1 View  Result Date: 09/07/2019 CLINICAL DATA:  COVID-19 positivity EXAM: PORTABLE CHEST 1 VIEW COMPARISON:  None. FINDINGS: Cardiac shadows within normal limits. Patchy atelectatic changes are noted in the left base. No bony abnormality is seen. No sizable effusion is noted. IMPRESSION: Patchy atelectatic changes in the left base. Electronically Signed   By: Inez Catalina M.D.   On: 09/07/2019 23:42    ED COURSE and MDM  Nursing notes, initial and subsequent vitals signs, including pulse oximetry, reviewed and interpreted by myself.  Vitals:   09/07/19 2308 09/07/19 2309  BP:  (!) 138/98  Pulse:  67  Resp:  16  Temp:  98.4 F (36.9 C)  TempSrc:  Oral  SpO2:  97%  Weight: 87.1 kg   Height: 5\' 8"  (1.727 m)    Medications  chlorpheniramine-HYDROcodone (TUSSIONEX) 10-8 MG/5ML suspension 5 mL (has no administration in time range)    Patient advised to continue the amoxicillin as the changes in the left base may represent a nascent pneumonia.  PROCEDURES  Procedures   ED DIAGNOSES     ICD-10-CM   1. Acute respiratory disease due to COVID-19 virus  U07.1    J06.9        Vaiden Adames, MD 09/07/19 2349

## 2019-09-13 ENCOUNTER — Emergency Department (HOSPITAL_COMMUNITY): Payer: 59

## 2019-09-13 ENCOUNTER — Encounter (HOSPITAL_BASED_OUTPATIENT_CLINIC_OR_DEPARTMENT_OTHER): Payer: Self-pay | Admitting: *Deleted

## 2019-09-13 ENCOUNTER — Emergency Department (HOSPITAL_BASED_OUTPATIENT_CLINIC_OR_DEPARTMENT_OTHER)
Admission: EM | Admit: 2019-09-13 | Discharge: 2019-09-13 | Disposition: A | Discharge status: Home / Self Care | Payer: 59 | Attending: Emergency Medicine | Admitting: Emergency Medicine

## 2019-09-13 ENCOUNTER — Other Ambulatory Visit: Payer: Self-pay

## 2019-09-13 ENCOUNTER — Emergency Department (HOSPITAL_BASED_OUTPATIENT_CLINIC_OR_DEPARTMENT_OTHER): Payer: 59

## 2019-09-13 DIAGNOSIS — J180 Bronchopneumonia, unspecified organism: Secondary | ICD-10-CM

## 2019-09-13 DIAGNOSIS — R4701 Aphasia: Secondary | ICD-10-CM | POA: Insufficient documentation

## 2019-09-13 DIAGNOSIS — R4182 Altered mental status, unspecified: Secondary | ICD-10-CM | POA: Insufficient documentation

## 2019-09-13 LAB — CBC WITH DIFFERENTIAL/PLATELET
Abs Immature Granulocytes: 0.12 10*3/uL — ABNORMAL HIGH (ref 0.00–0.07)
Basophils Absolute: 0 10*3/uL (ref 0.0–0.1)
Basophils Relative: 1 %
Eosinophils Absolute: 0.1 10*3/uL (ref 0.0–0.5)
Eosinophils Relative: 2 %
HCT: 45.5 % (ref 39.0–52.0)
Hemoglobin: 15.6 g/dL (ref 13.0–17.0)
Immature Granulocytes: 2 %
Lymphocytes Relative: 22 %
Lymphs Abs: 1.3 10*3/uL (ref 0.7–4.0)
MCH: 31.4 pg (ref 26.0–34.0)
MCHC: 34.3 g/dL (ref 30.0–36.0)
MCV: 91.5 fL (ref 80.0–100.0)
Monocytes Absolute: 0.7 10*3/uL (ref 0.1–1.0)
Monocytes Relative: 12 %
Neutro Abs: 3.6 10*3/uL (ref 1.7–7.7)
Neutrophils Relative %: 61 %
Platelets: 335 10*3/uL (ref 150–400)
RBC: 4.97 MIL/uL (ref 4.22–5.81)
RDW: 11.7 % (ref 11.5–15.5)
WBC: 5.9 10*3/uL (ref 4.0–10.5)
nRBC: 0 % (ref 0.0–0.2)

## 2019-09-13 LAB — LACTIC ACID, PLASMA: Lactic Acid, Venous: 1.3 mmol/L (ref 0.5–1.9)

## 2019-09-13 LAB — RAPID URINE DRUG SCREEN, HOSP PERFORMED
Amphetamines: NOT DETECTED
Barbiturates: NOT DETECTED
Benzodiazepines: NOT DETECTED
Cocaine: NOT DETECTED
Opiates: NOT DETECTED
Tetrahydrocannabinol: NOT DETECTED

## 2019-09-13 LAB — COMPREHENSIVE METABOLIC PANEL
ALT: 118 U/L — ABNORMAL HIGH (ref 0–44)
AST: 59 U/L — ABNORMAL HIGH (ref 15–41)
Albumin: 3.8 g/dL (ref 3.5–5.0)
Alkaline Phosphatase: 144 U/L — ABNORMAL HIGH (ref 38–126)
Anion gap: 7 (ref 5–15)
BUN: 14 mg/dL (ref 6–20)
CO2: 25 mmol/L (ref 22–32)
Calcium: 8.8 mg/dL — ABNORMAL LOW (ref 8.9–10.3)
Chloride: 106 mmol/L (ref 98–111)
Creatinine, Ser: 1.03 mg/dL (ref 0.61–1.24)
GFR calc Af Amer: >60 mL/min (ref >60)
GFR calc non Af Amer: >60 mL/min (ref >60)
Glucose, Bld: 102 mg/dL — ABNORMAL HIGH (ref 70–99)
Potassium: 4 mmol/L (ref 3.5–5.1)
Sodium: 138 mmol/L (ref 135–145)
Total Bilirubin: 1.2 mg/dL (ref 0.3–1.2)
Total Protein: 6.9 g/dL (ref 6.5–8.1)

## 2019-09-13 LAB — URINALYSIS, ROUTINE W REFLEX MICROSCOPIC
Bilirubin Urine: NEGATIVE
Glucose, UA: NEGATIVE mg/dL
Hgb urine dipstick: NEGATIVE
Ketones, ur: NEGATIVE mg/dL
Leukocytes,Ua: NEGATIVE
Nitrite: NEGATIVE
Protein, ur: NEGATIVE mg/dL
Specific Gravity, Urine: 1.02 (ref 1.005–1.030)
pH: 6 (ref 5.0–8.0)

## 2019-09-13 LAB — TSH: TSH: 1.373 u[IU]/mL (ref 0.350–4.500)

## 2019-09-13 LAB — AMMONIA: Ammonia: 24 umol/L (ref 9–35)

## 2019-09-13 IMAGING — MR MR HEAD WO/W CM
10 of 12 series · 34 of 48 positions shown · IV contrast (Yes   MULTIHANCE)
Comparison: CT head [DATE]

CLINICAL DATA: Altered mental status

EXAM:
MRI HEAD WITHOUT AND WITH CONTRAST
TECHNIQUE: Multiplanar, multiecho pulse sequences of the brain and surrounding
structures were obtained without and with intravenous contrast.
CONTRAST:  8mL GADAVIST GADOBUTROL 1 MMOL/ML IV SOLN

[Series 3: DWI · axial · 3.0mm · 1.05mm/px · z∈[-29,+131]mm · 8 of 110 slices shown (1 of 4)]
[im 1/110]
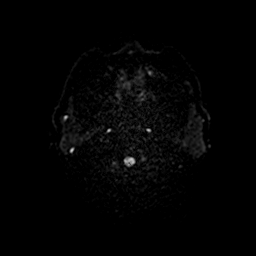
[im 13/110]
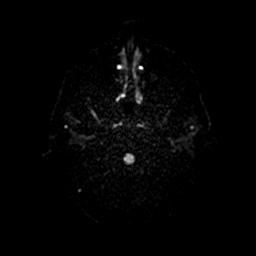
[im 37/110]
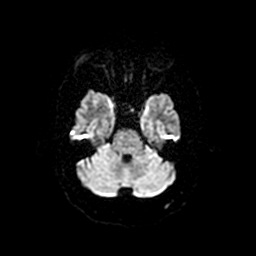
[im 49/110]
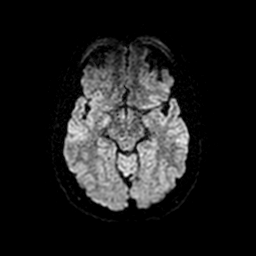
[im 61/110]
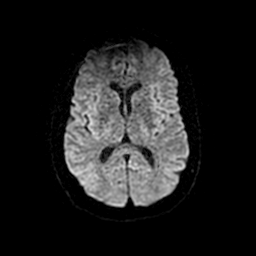
[im 73/110]
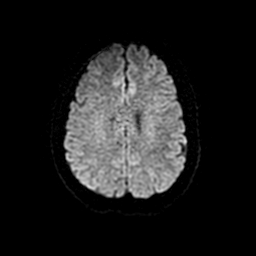
[im 97/110]
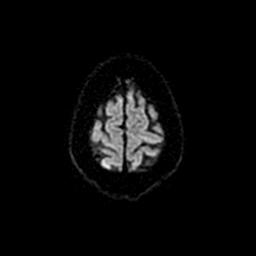
[im 110/110]
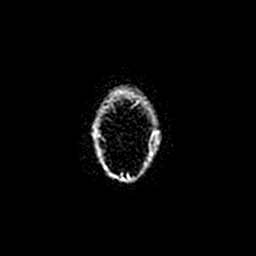

[Series 4: DWI · coronal · 5.0mm · 1.09mm/px · 6 of 80 slices shown (2 of 4)]
[im 1/80]
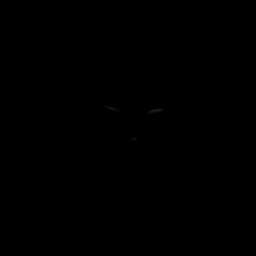
[im 16/80]
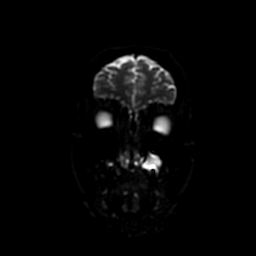
[im 32/80]
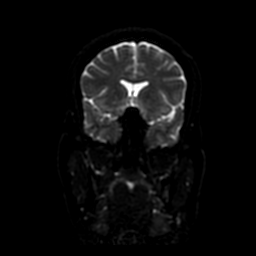
[im 48/80]
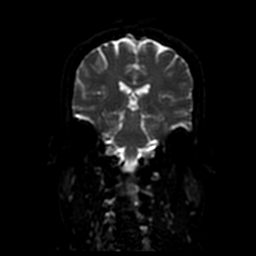
[im 64/80]
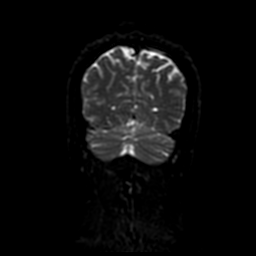
[im 80/80]
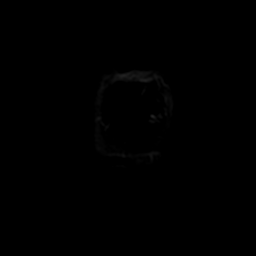

[Series 5: T1 · sagittal · 5.0mm · 0.47mm/px · 1 of 23 slices shown]
[im 1/23]
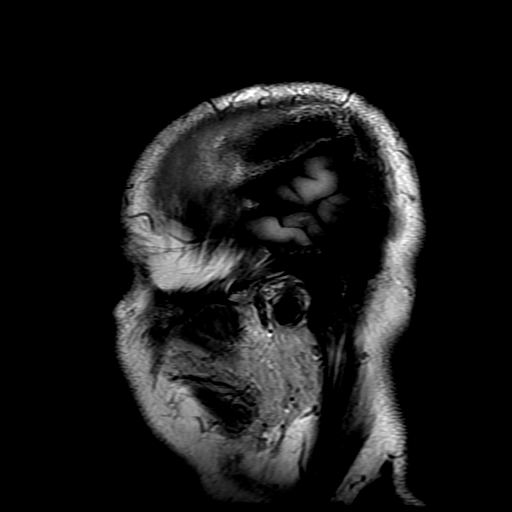

[Series 6: T2 · axial · 5.0mm · 0.43mm/px · z∈[-29,+119]mm · 2 of 26 slices shown]
[im 1/26]
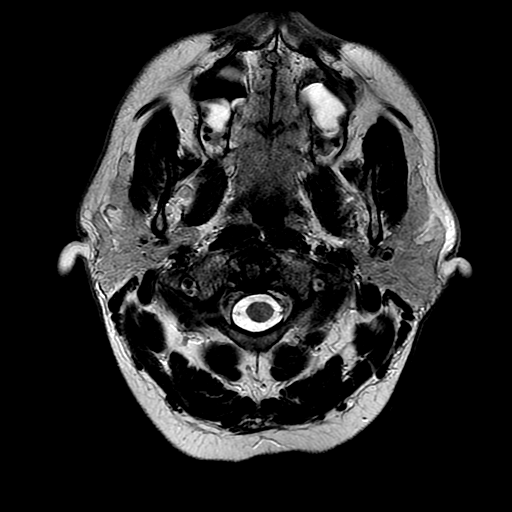
[im 26/26]
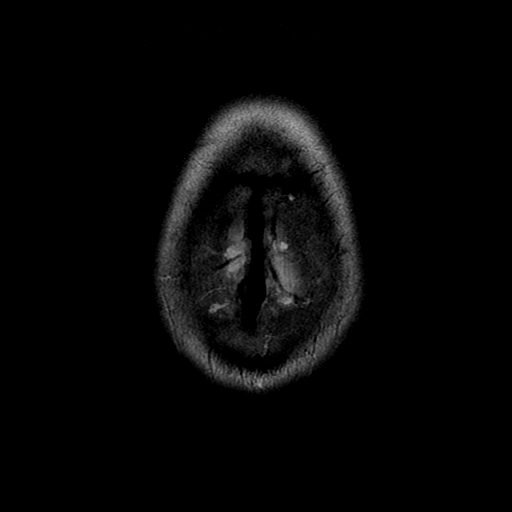

[Series 7: FLAIR · axial · 3.0mm · 0.43mm/px · z∈[-29,+119]mm · 2 of 26 slices shown]
[im 1/26]
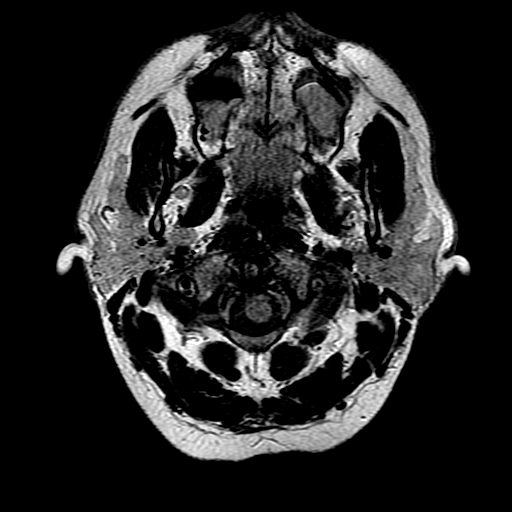
[im 26/26]
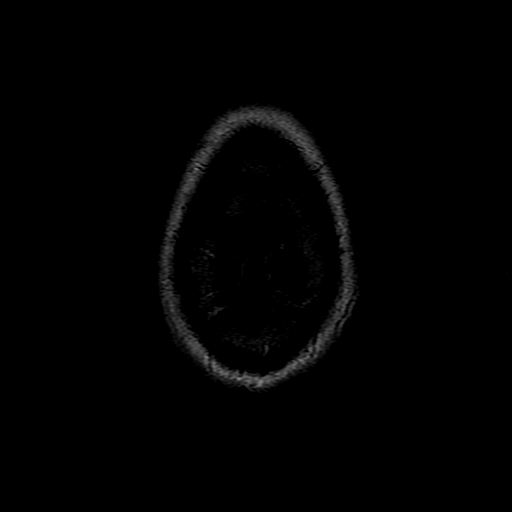

[Series 10: T2 post-contrast · coronal · 5.0mm · 0.39mm/px · 2 of 25 slices shown]
[im 1/25]
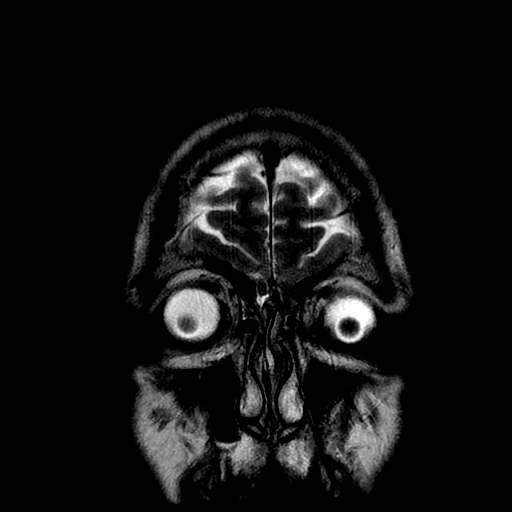
[im 25/25]
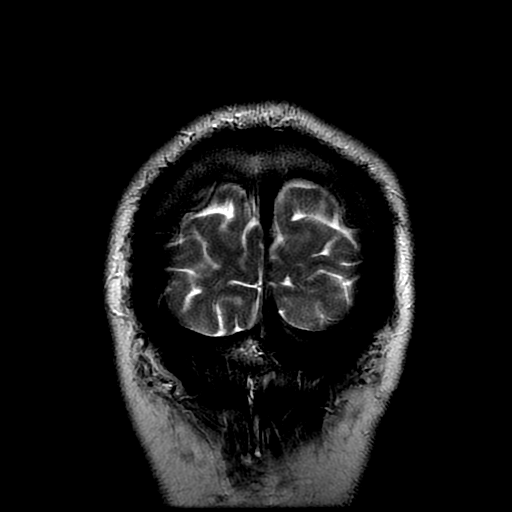

[Series 11: T1 post-contrast · axial · 3.0mm · 0.43mm/px · z∈[-29,+129]mm · 4 of 54 slices shown (1 of 2)]
[im 1/54]
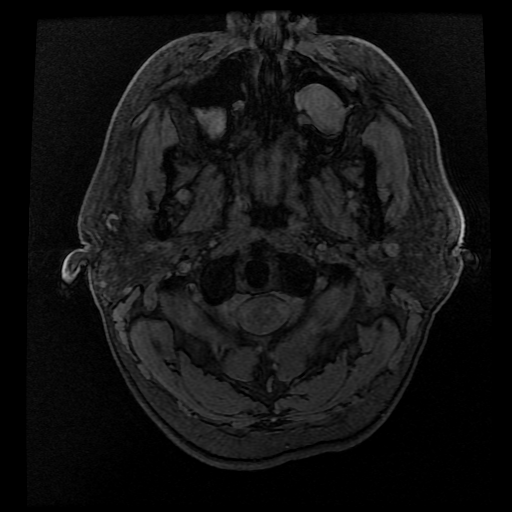
[im 18/54]
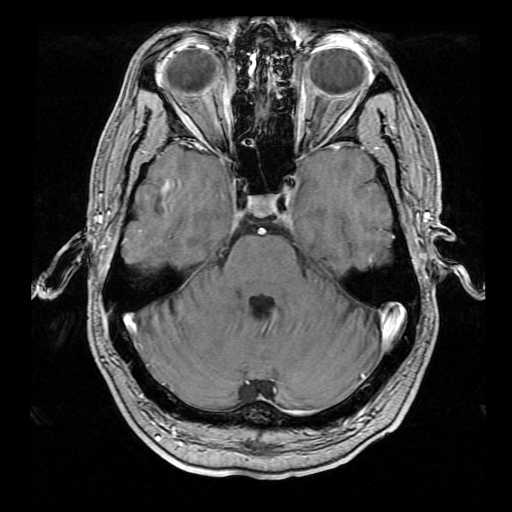
[im 36/54]
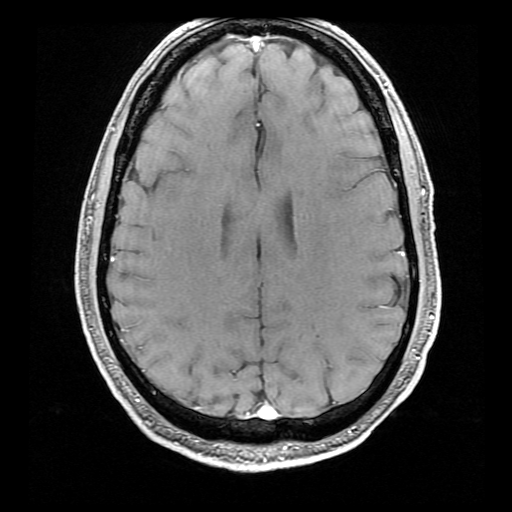
[im 54/54]
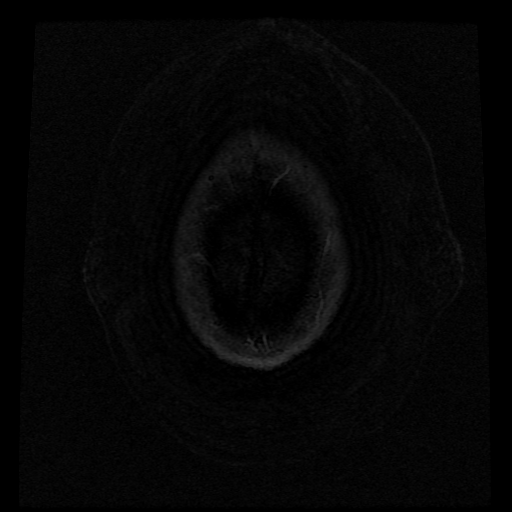

[Series 12: T1 post-contrast · coronal · 5.0mm · 0.39mm/px · 2 of 25 slices shown (2 of 2)]
[im 1/25]
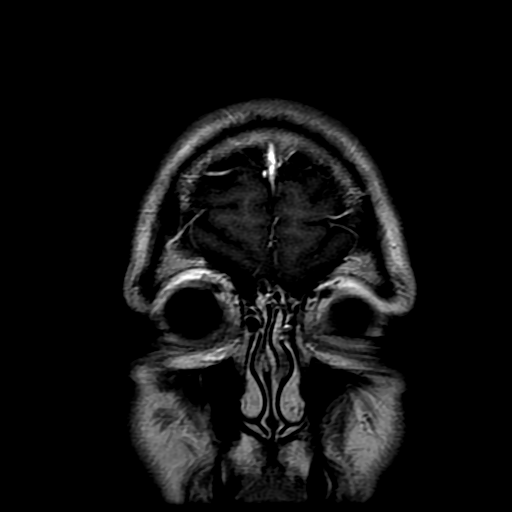
[im 25/25]
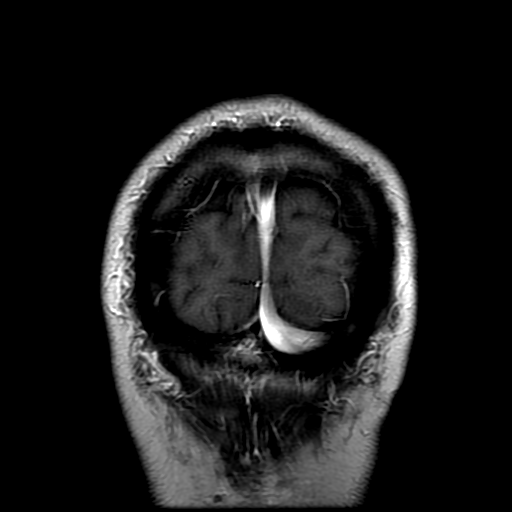

[Series 300: DWI · axial · 3.0mm · 1.05mm/px · z∈[-29,+131]mm · 4 of 55 slices shown (3 of 4)]
[im 1/55]
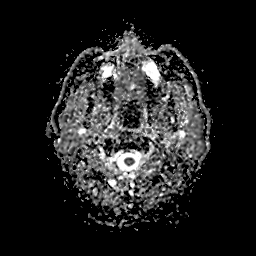
[im 19/55]
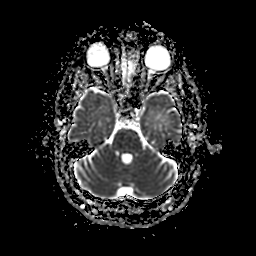
[im 37/55]
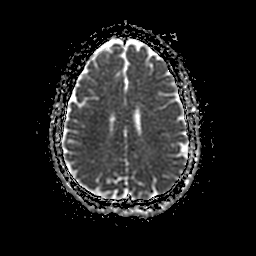
[im 55/55]
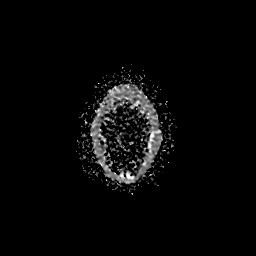

[Series 400: DWI · coronal · 5.0mm · 1.09mm/px · 3 of 39 slices shown (4 of 4)]
[im 1/39]
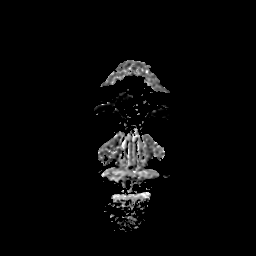
[im 20/39]
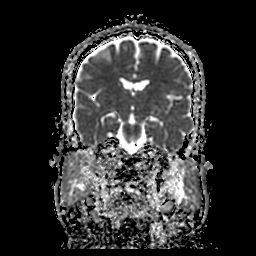
[im 39/39]
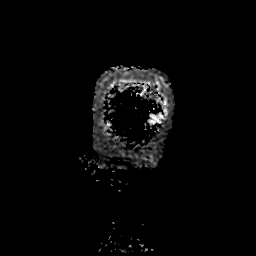

[34 of 48 positions shown; findings below may reference images not displayed]

FINDINGS: Brain: No acute infarction, hemorrhage, hydrocephalus, extra-axial
collection or mass lesion. Normal white matter

Normal enhancement following contrast infusion.

Vascular: Normal arterial flow voids.

Skull and upper cervical spine: No focal skeletal lesion.

Sinuses/Orbits: Mild mucosal edema paranasal sinuses. Retention
cysts bilaterally in the maxillary sinus. Normal orbit

Other: None
IMPRESSION: Normal MRI brain with contrast

Sinus mucosal disease.

## 2019-09-13 IMAGING — CT CT HEAD W/O CM
3 series · 15 of 47 positions shown, 18 images · non-contrast
Comparison: None.

CLINICAL DATA: Altered level of consciousness, confusion for 2 days

EXAM:
CT HEAD WITHOUT CONTRAST
TECHNIQUE: Contiguous axial images were obtained from the base of the skull
through the vertex without intravenous contrast.

[Series 2: head wo · axial · 0.42mm/px · z∈[+898,+1028]mm · 9 of 32 slices shown, 12 images]
[im 3/32  brain]
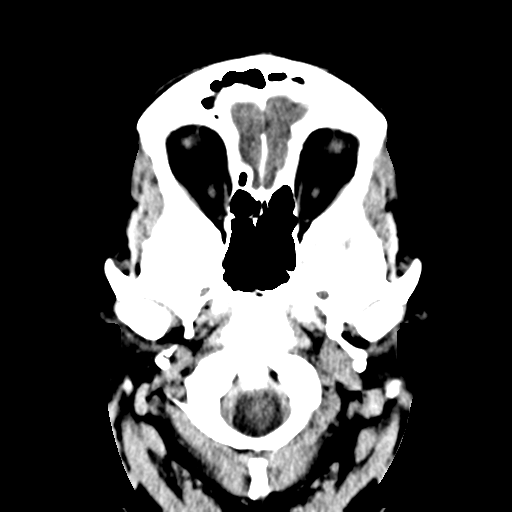
[im 3/32  bone]
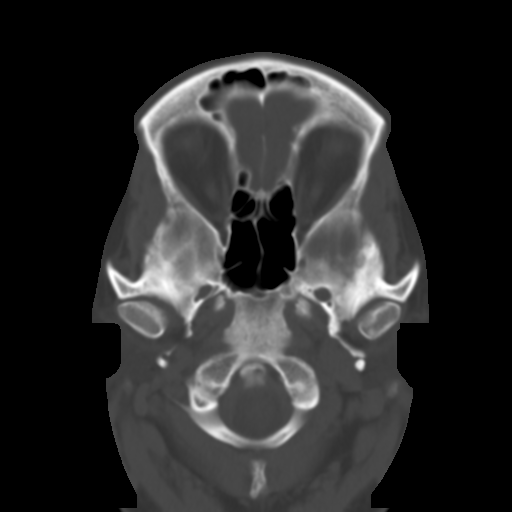
[im 6/32  brain]
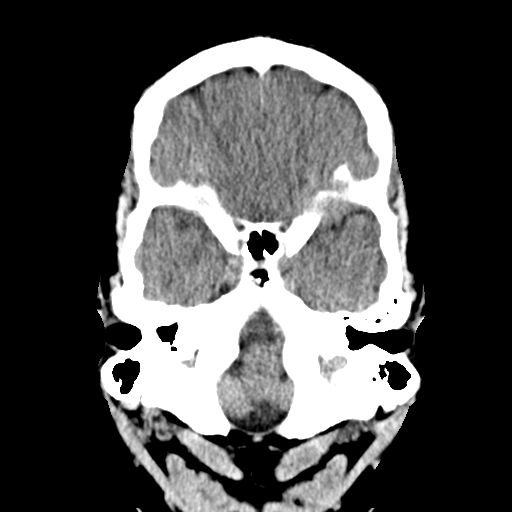
[im 9/32  brain]
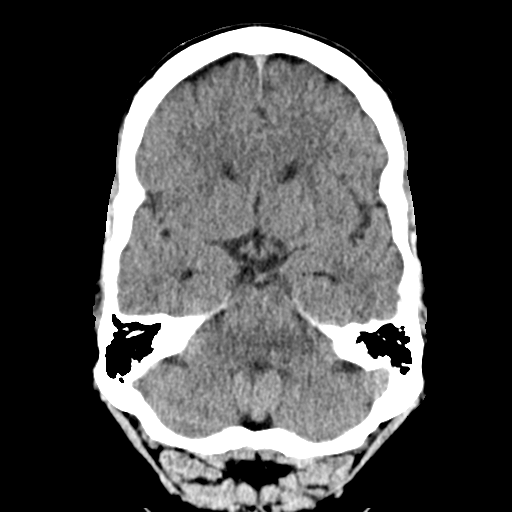
[im 12/32  brain]
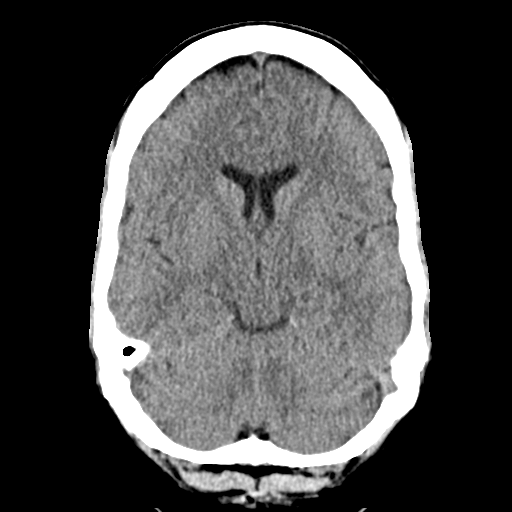
[im 17/32  brain]
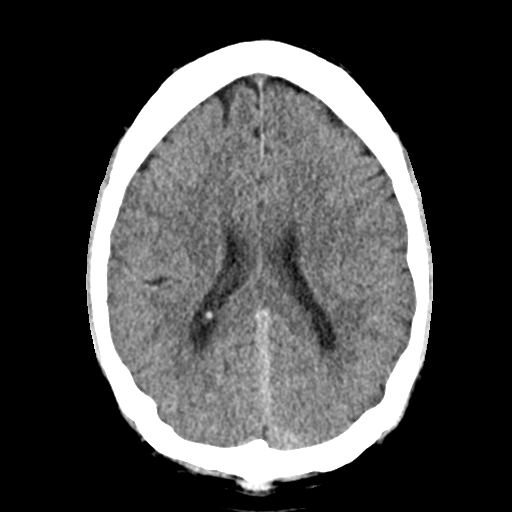
[im 17/32  bone]
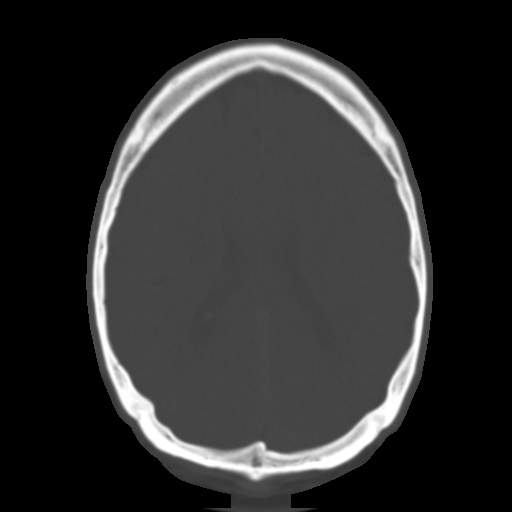
[im 20/32  brain]
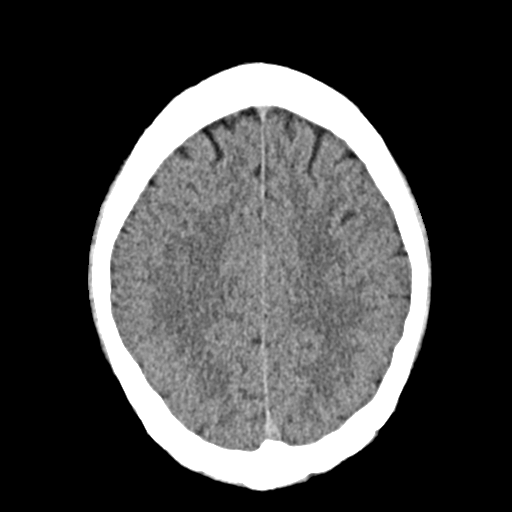
[im 23/32  brain]
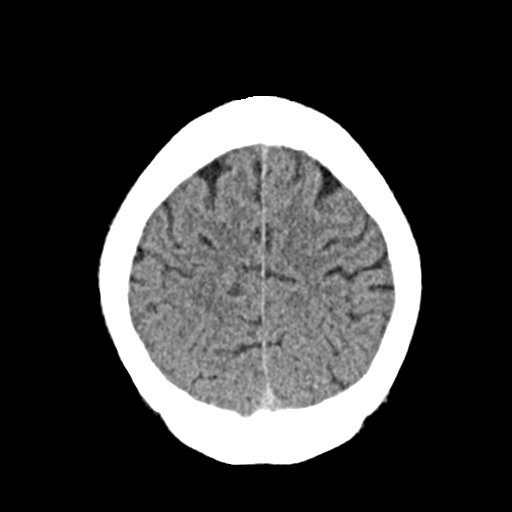
[im 26/32  brain]
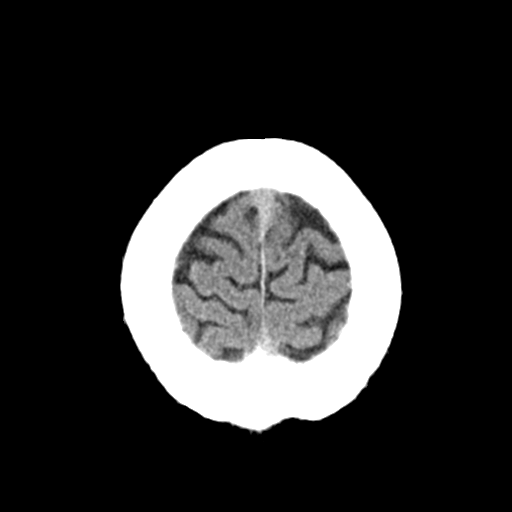
[im 29/32  brain]
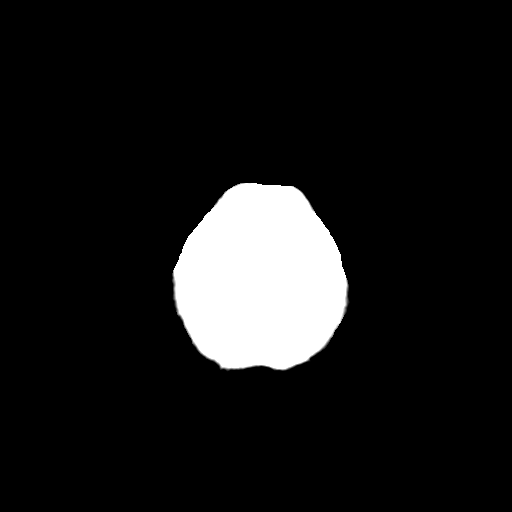
[im 29/32  bone]
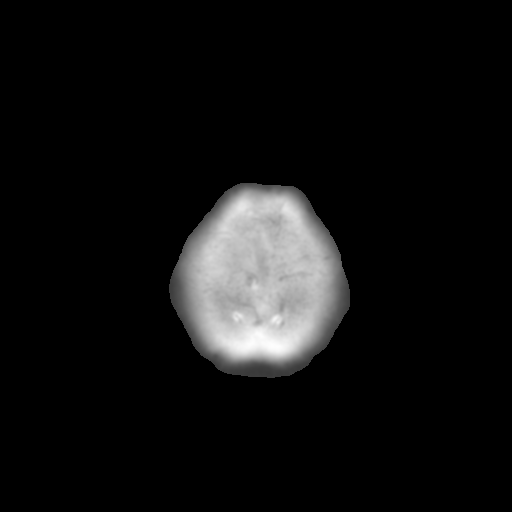

[Series 4: coronal soft · coronal · 0.30mm/px · 3 of 69 slices shown]
[im 23/69  brain]
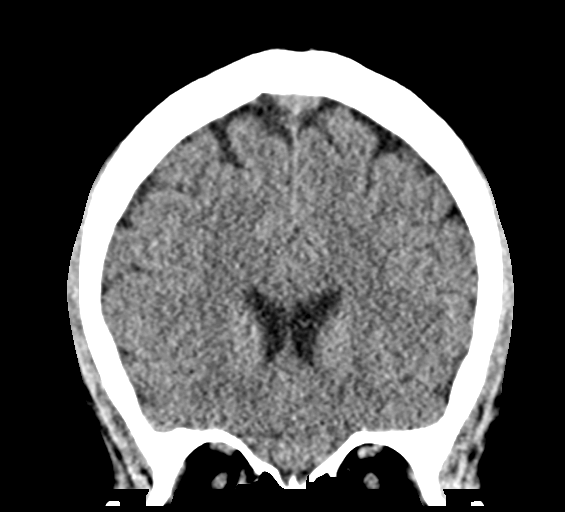
[im 31/69  brain]
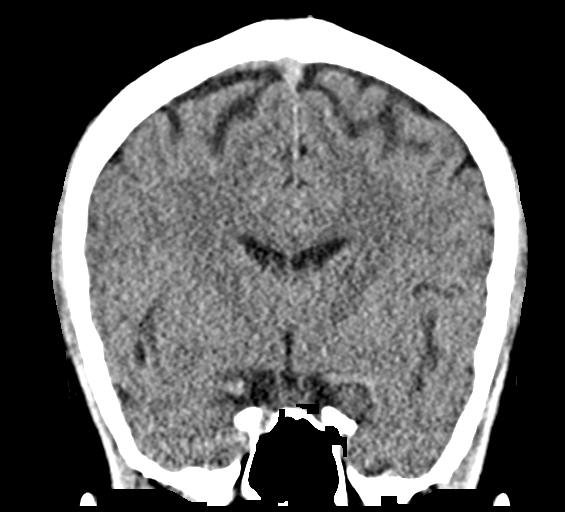
[im 38/69  brain]
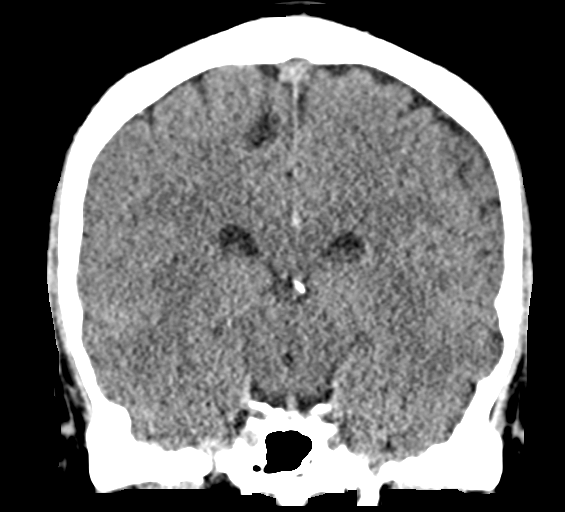

[Series 5: sag soft · sagittal · 0.30mm/px · 3 of 55 slices shown]
[im 19/55  brain]
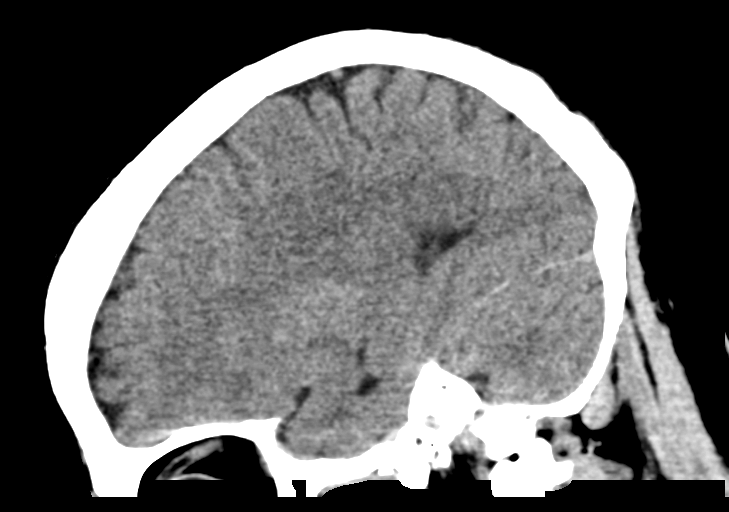
[im 28/55  brain]
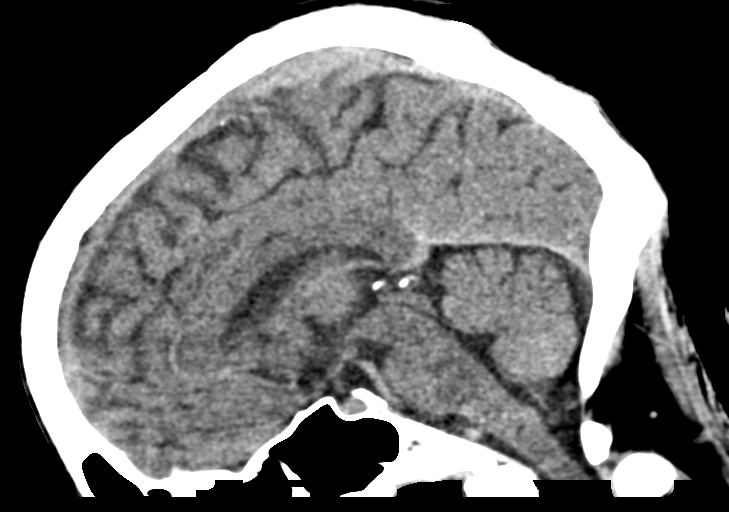
[im 37/55  brain]
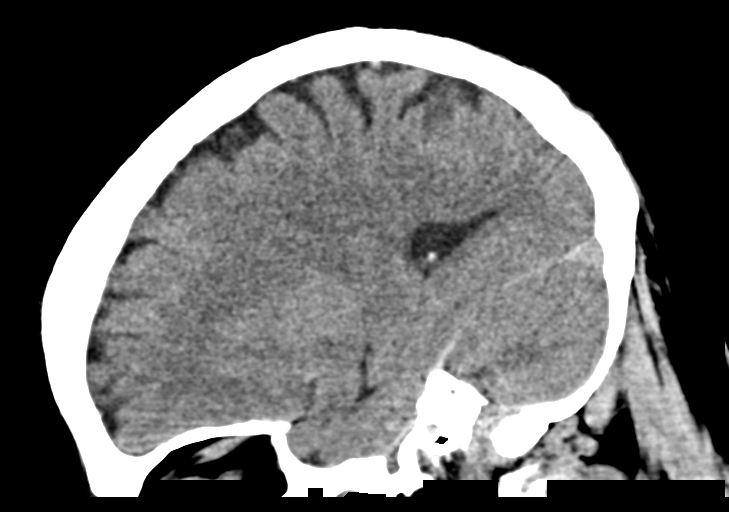

[15 of 47 positions shown; findings below may reference images not displayed]

FINDINGS: Brain: No acute infarct or hemorrhage. Lateral ventricles and
midline structures are unremarkable. No acute extra-axial fluid
collections. No mass effect.

Vascular: No hyperdense vessel or unexpected calcification.

Skull: Normal. Negative for fracture or focal lesion.

Sinuses/Orbits: No acute finding.

Other: None
IMPRESSION: 1. No acute intracranial process.

## 2019-09-13 IMAGING — DX DG CHEST 1V PORT
1 series · 1 of 1 positions shown · non-contrast
Comparison: [DATE]

CLINICAL DATA: Altered level of consciousness for 2 days, [NN]
positive [DATE]

EXAM:
PORTABLE CHEST 1 VIEW

[chest ap]
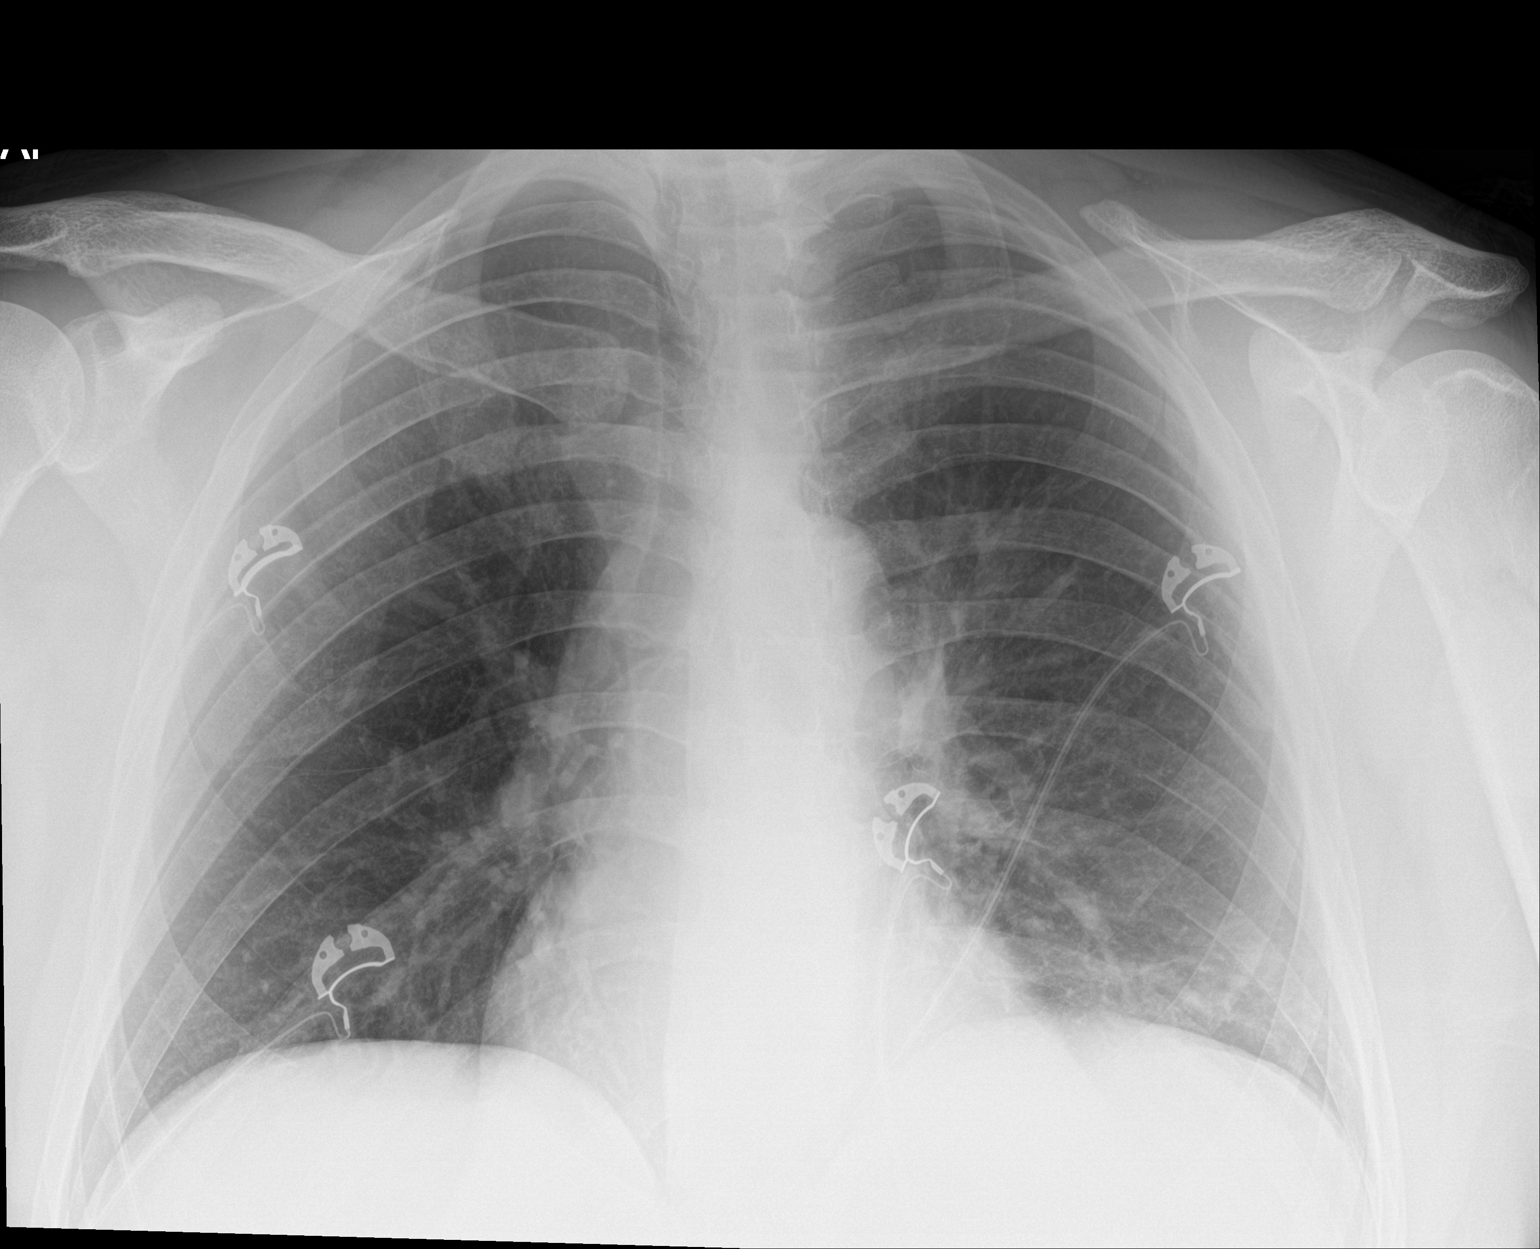

[1 of 1 positions shown; findings below may reference images not displayed]

FINDINGS: Single frontal view of the chest demonstrates an unremarkable
cardiac silhouette. Slight progression of the patchy consolidation
at the left lung base since prior study. No effusion or
pneumothorax. No acute bony abnormalities.
IMPRESSION: 1. Patchy left basilar consolidation, which could reflect
bronchopneumonia. Slight progression since prior study.

## 2019-09-13 MED ORDER — GADOBUTROL 1 MMOL/ML IV SOLN
8.0000 mL | Freq: Once | INTRAVENOUS | Status: AC | PRN
  Administered 2019-09-13: 8 mL via INTRAVENOUS

## 2019-09-13 NOTE — ED Provider Notes (Addendum)
[facility] EMERGENCY DEPARTMENT Provider Note   CSN: [id number] Arrival date & time: 09/13/19  [id number]     History Chief Complaint  Patient presents with  . Altered Mental Status    Raymond Kelly is a 49 y.o. male with history of alopecia universalis, nephrolithiasis, hyperbilirubinemia presents for evaluation of acute onset, persistent and progressively worsening altered mental status for 2 days.  He was recently diagnosed with Covid, tested positive on 09/05/2019.  At the time had fever, loss of taste and smell, cough.  He has been on a course of amoxicillin for possible pneumonia which he reports he is still taking.  He reports that for the last 2 days he has felt confused, having memory difficulties and has been feeling "brain fog".  He states that he has had staring spells.  Per letter written by the patient's wife who is a nurse "last night at 8:30 PM the symptoms were noticeable to him and his daughter.  He was spaced out and did not answer questions appropriately per my daughter."  Also per patient's wife this morning the patient had "difficulty articulating, borderline aphasic".  His father reportedly had frequent TIAs and several strokes before passing.  The patient cannot remember if he is experiencing any headaches.  He denies vision changes, photophobia, numbness or weakness of the extremities, nausea, vomiting, abdominal pain, urinary symptoms, diarrhea, constipation, chest pain or shortness of breath.  He reports he has been afebrile for the last 3 days.  Denies neck stiffness.  No new medications otherwise.  The patient is a non-smoker, denies recreational drug use or excessive alcohol intake.  The history is provided by the patient and the spouse.       Past Medical History:  Diagnosis Date  . Family history of adverse reaction to anesthesia    mother slow to awaken  . History of kidney stones 2007    Patient Active Problem List   Diagnosis Date Noted  .  Hyperbilirubinemia 02/04/2016  . Bile leak, postoperative     Past Surgical History:  Procedure Laterality Date  . CHOLECYSTECTOMY N/A 02/01/2016   Procedure: LAPAROSCOPIC CHOLECYSTECTOMY WITH INTRAOPERATIVE CHOLANGIOGRAM;  Surgeon: Coralie Keens, MD;  Location: [facility];  Service: General;  Laterality: N/A;  . cyst removed     between his front teeth and cyst removed from abdomal area  . ESOPHAGOGASTRODUODENOSCOPY (EGD) WITH PROPOFOL N/A 03/06/2016   Procedure: ESOPHAGOGASTRODUODENOSCOPY (EGD) WITH PROPOFOL;  Surgeon: Doran Stabler, MD;  Location: [facility];  Service: Gastroenterology;  Laterality: N/A;  with stent removal.  . GASTROINTESTINAL STENT REMOVAL N/A 03/06/2016   Procedure: GASTROINTESTINAL STENT REMOVAL;  Surgeon: Doran Stabler, MD;  Location: [facility];  Service: Gastroenterology;  Laterality: N/A;  . TONSILLECTOMY    . ULNAR NERVE TRANSPOSITION         History reviewed. No pertinent family history.  Social History   Tobacco Use  . Smoking status: Never Smoker  . Smokeless tobacco: Never Used  Substance Use Topics  . Alcohol use: Not Currently    Comment: social at times  . Drug use: No    Home Medications Prior to Admission medications   Medication Sig Start Date End Date Taking? Authorizing Provider  amoxicillin (AMOXIL) 125 MG/5ML suspension Take by mouth 3 (three) times daily.    [provider]  chlorpheniramine-HYDROcodone (TUSSIONEX PENNKINETIC ER) 10-8 MG/5ML SUER Take 5 mLs by mouth every 12 (twelve) hours as needed for cough. 09/07/19   Molpus, Jenny Reichmann,  MD  docusate sodium (COLACE) 100 MG capsule Take 100 mg by mouth 2 (two) times daily as needed for mild constipation.    [provider]  simethicone (MYLICON) 80 MG chewable tablet Chew 80-160 mg by mouth every 6 (six) hours as needed for flatulence.  09/07/19  [provider]    Allergies    No known allergies  Review of Systems   Review of Systems  Constitutional:  Negative for chills and fever.  Eyes: Negative for photophobia and visual disturbance.  Respiratory: Negative for shortness of breath.   Cardiovascular: Negative for chest pain.  Gastrointestinal: Negative for abdominal pain, nausea and vomiting.  Genitourinary: Negative for dysuria, frequency, hematuria and urgency.  Neurological: Negative for dizziness, weakness, numbness and headaches.  Psychiatric/Behavioral: Positive for confusion.  All other systems reviewed and are negative.   Physical Exam Updated Vital Signs BP 116/80 (BP Location: Right Arm)   Pulse (!) 58   Temp 98 F (36.7 C) (Oral)   Resp 16   SpO2 99%   Physical Exam Vitals and nursing note reviewed.  Constitutional:      General: He is not in acute distress.    Appearance: He is well-developed.     Comments: Resting in bed, staring off frequently, somewhat slow to answer questions  HENT:     Head: Normocephalic and atraumatic.  Eyes:     General:        Right eye: No discharge.        Left eye: No discharge.     Extraocular Movements: Extraocular movements intact.     Conjunctiva/sclera: Conjunctivae normal.     Pupils: Pupils are equal, round, and reactive to light.  Neck:     Vascular: No JVD.     Trachea: No tracheal deviation.  Cardiovascular:     Rate and Rhythm: Normal rate and regular rhythm.     Heart sounds: Normal heart sounds.  Pulmonary:     Effort: Pulmonary effort is normal.     Breath sounds: Normal breath sounds.     Comments: Speaking in full sentences without difficulty, SPO2 saturations 99% on room air Abdominal:     General: Bowel sounds are normal. There is no distension.     Palpations: Abdomen is soft.     Tenderness: There is no abdominal tenderness. There is no guarding or rebound.  Musculoskeletal:     Cervical back: Normal range of motion and neck supple.  Skin:    General: Skin is warm and dry.     Findings: No erythema.  Neurological:     Mental Status: He is alert.      Comments: Mental Status:  Alert, thought content appropriate; appears mildly confused.  Somewhat slow to answer questions.  Speech fluent without evidence of aphasia.  Cranial Nerves:  II:  Peripheral visual fields grossly normal, pupils equal, round, reactive to light III,IV, VI: ptosis not present, extra-ocular motions intact bilaterally  V,VII: smile symmetric, facial light touch sensation equal VIII: hearing grossly normal to voice  X: uvula elevates symmetrically  XI: bilateral shoulder shrug symmetric and strong XII: midline tongue extension without fassiculations Motor:  Normal tone. 5/5 strength of BUE and BLE major muscle groups including strong and equal grip strength and dorsiflexion/plantar flexion Sensory: light touch normal in all extremities. Cerebellar: normal finger-to-nose with bilateral upper extremities, Romberg sign absent Gait: normal gait and balance. Able to walk on toes and heels with ease.    Psychiatric:  Speech: Speech is delayed.        Behavior: Behavior is withdrawn. Behavior is cooperative.        Cognition and Memory: Memory is impaired.     ED Results / Procedures / Treatments   Labs (all labs ordered are listed, but only abnormal results are displayed) Labs Reviewed  COMPREHENSIVE METABOLIC PANEL - Abnormal; Notable for the following components:      Result Value   Glucose, Bld 102 (*)    Calcium 8.8 (*)    AST 59 (*)    ALT 118 (*)    Alkaline Phosphatase 144 (*)    All other components within normal limits  CBC WITH DIFFERENTIAL/PLATELET - Abnormal; Notable for the following components:   Abs Immature Granulocytes 0.12 (*)    All other components within normal limits  URINE CULTURE  LACTIC ACID, PLASMA  URINALYSIS, ROUTINE W REFLEX MICROSCOPIC  RAPID URINE DRUG SCREEN, HOSP PERFORMED  AMMONIA  LACTIC ACID, PLASMA  TSH    EKG EKG Interpretation  Date/Time:  Tuesday September 13 2019 10:08:30 EST Ventricular Rate:  54 PR  Interval:    QRS Duration: 103 QT Interval:  424 QTC Calculation: 402 R Axis:   52 Text Interpretation: Sinus rhythm Baseline wander in lead(s) I III aVL No STEMI Confirmed by Octaviano Glow [id number]) on 09/13/2019 10:16:22 AM   Radiology CT Head Wo Contrast  Result Date: 09/13/2019 CLINICAL DATA:  Altered level of consciousness, confusion for 2 days EXAM: CT HEAD WITHOUT CONTRAST TECHNIQUE: Contiguous axial images were obtained from the base of the skull through the vertex without intravenous contrast. COMPARISON:  None. FINDINGS: Brain: No acute infarct or hemorrhage. Lateral ventricles and midline structures are unremarkable. No acute extra-axial fluid collections. No mass effect. Vascular: No hyperdense vessel or unexpected calcification. Skull: Normal. Negative for fracture or focal lesion. Sinuses/Orbits: No acute finding. Other: None IMPRESSION: 1. No acute intracranial process. Electronically Signed   By: Randa Ngo M.D.   On: 09/13/2019 10:29   DG Chest Portable 1 View  Result Date: 09/13/2019 CLINICAL DATA:  Altered level of consciousness for 2 days, COVID-19 positive 07/2019 EXAM: PORTABLE CHEST 1 VIEW COMPARISON:  09/07/2019 FINDINGS: Single frontal view of the chest demonstrates an unremarkable cardiac silhouette. Slight progression of the patchy consolidation at the left lung base since prior study. No effusion or pneumothorax. No acute bony abnormalities. IMPRESSION: 1. Patchy left basilar consolidation, which could reflect bronchopneumonia. Slight progression since prior study. Electronically Signed   By: Randa Ngo M.D.   On: 09/13/2019 10:30    Procedures Procedures (including critical care time)  Medications Ordered in ED Medications - No data to display  ED Course  I have reviewed the triage vital signs and the nursing notes.  Pertinent labs & imaging results that were available during my care of the patient were reviewed by me and considered in my medical decision  making (see chart for details).    MDM Rules/Calculators/A&P                      Patient presenting for evaluation of altered mental status.  Per information provided by the wife at the bedside the patient has been more "spaced out", staring spells, borderline aphasic.  No focal neurologic deficits on my assessment but he does appear mildly confused, somewhat slow to answer questions and follow commands.  Lab work reviewed and interpreted by myself shows no leukocytosis, no anemia, no metabolic derangements.  He has  chronic mild elevation in his LFTs which is about baseline.  UA does not suggest UTI.  His UDS was negative.  His ammonia level is within normal limits.  His chest x-ray shows possible progression of bronchopneumonia.  He has been on a course of antibiotics for this but it could be reasonable to switch him to a different course.  Head CT shows no acute intracranial abnormalities.   CONSULT: Spoke with Dr. Cheral Marker with neurology.  He recommends transfer to [facility] for MRI of brain with contrast and emergent EEG.  We would like to rule out paraneoplastic syndrome. He is requesting that the neurology service received a phone call when the patient is in the department so that they may evaluate him in person.  If work-up during he recommends close outpatient neurology follow-up.  Spoke with Dr. Ashok Cordia at [facility] ED who agrees to transfer.  Patient will be transferred POV with his wife driving.   Final Clinical Impression(s) / ED Diagnoses Final diagnoses:  Altered mental status, unspecified altered mental status type  Bronchopneumonia    Rx / DC Orders ED Discharge Orders    None       Renita Papa, PA-C 09/13/19 1152    Wyvonnia Dusky, MD 09/13/19 [phone]

## 2019-09-13 NOTE — ED Provider Notes (Signed)
49 year old male received in sign out. He had COVID 2 weeks ago. Has had some episodes of staring and possible aphasia. Neuro ws consulted and he was sent from [facility] for MRI and EEG. Neuro to see.  MRI is negative. EEG is pending  10:02 PM EEG is negative. Advised f/u with PCP    Recardo Evangelist, PA-C 09/13/19 2203    Charlesetta Shanks, MD 09/14/19 [phone]

## 2019-09-13 NOTE — ED Triage Notes (Signed)
Pt arrived POV from [facility] for MRI

## 2019-09-13 NOTE — Consult Note (Addendum)
Neurology Consultation Reason for Consult: Altered mental status Referring Physician: Dr. Charlesetta Shanks  CC: Altered mental status  History is obtained from: Patient, chart review, wife  HPI: Raymond Kelly is a 49 y.o. 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  Patient states he was diagnosed with COVID19 infection on 09/05/2019 and has been on amoxicillin. 2 days ago he started noticing some word finding difficulties and 'spacing out", was aware he is spacing out. This was also witnessed by his wife and daughter. Therefore, he decided to come to ED to get evaluated.  Of note, he also reported brief left arm paresthesias "as if cold wind is blowing on my arm" about 2 weeks ago which self resolved and hasnt recurred since. Denies any prior h/o seizure, meningitis, head injury. Denies any other new medications ( except amoxicillin).   ROS: A 14 point ROS was performed and is negative except as noted in the HPI.   Past Medical History:  Diagnosis Date  . Family history of adverse reaction to anesthesia    mother slow to awaken  . History of kidney stones 2007    History reviewed. No pertinent family history.  Social History:  reports that he has never smoked. He has never used smokeless tobacco. He reports previous alcohol use. He reports that he does not use drugs.  Exam: Current vital signs: BP 139/84 (BP Location: Right Arm)   Pulse (!) 52   Temp 97.6 F (36.4 C) (Oral)   Resp 16   SpO2 100%  Vital signs in last 24 hours: Temp:  [97.6 F (36.4 C)-98 F (36.7 C)] 97.6 F (36.4 C) (03/09 1235) Pulse Rate:  [52-60] 52 (03/09 1235) Resp:  [16-18] 16 (03/09 1235) BP: (116-139)/(80-88) 139/84 (03/09 1235) SpO2:  [98 %-100 %] 100 % (03/09 1235)   Physical Exam  Constitutional:  Appears well-developed and well-nourished.  Psych: Affect appropriate to situation Eyes: No scleral injection HENT: No OP obstrucion Head: Normocephalic, atraumatic Cardiovascular: Normal rate and regular rhythm.  Respiratory: Effort normal, non-labored breathing GI: Soft.  No distension. There is no tenderness.  Skin: Warm, no ulcer  Neuro: Mental Status: Patient is awake, alert, oriented to person, place, month, year, and situation. Patient is able to give a clear and coherent history. No signs of aphasia or neglect Was able to spell WORLD backwards after 4 attempts. Trouble with serial 7s, able to remember 4/5 words in 5 minutes Cranial Nerves: II: Visual Fields are full. Pupils are equal, round, and reactive to light.   III,IV, VI: EOMI without ptosis or diploplia.  V: Facial sensation is symmetric to temperature VII: Facial movement is symmetric.  VIII: hearing is intact to voice X: Uvula elevates symmetrically XI: Shoulder shrug is symmetric. XII: tongue is midline without atrophy or fasciculations.  Motor: Tone is normal. Bulk is normal. 5/5 strength was present in all four extremities.  Sensory: Sensation is symmetric to light touch and temperature in the arms and legs. Deep Tendon Reflexes: 2+ and symmetric in the biceps and patellae.  Plantars: Toes are downgoing bilaterally.  Cerebellar: FNF intact bilaterally  I have reviewed labs in epic and the results pertinent to this consultation are: No leukocytosis, normal electrolytes, UA not suggestive of UTI, UDS negative for drugs of abuse, ammonia normal AST 59, ALT 118, alk phos 144,  total bilirubin 1.2  I have reviewed the images obtained: CT head without contrast 09/13/2019: No acute abnormality. Chest x-ray 09/13/2019: Patchy left basilar consolidation, which could reflect bronchopneumonia. Slight progression since prior study.  ASSESSMENT/PLAN: 49 year old male with recent COVID-19 infection presented with  altered mental status.  Transient alteration of awareness COVID-19 infection Transaminitis - semiology of episodes suggestive of COVID 19 related transient neurological symptoms also described as brain fog at times. The seizures reported in patients with COVID 19 are seen in much sicker patients and not of staring/absence type.  - However, given persistence of symptoms and h/o autoimmune disease ( alopecia areata), its important to do a through workup   Recommendations - MRI brain w and wo contrast to assess for any evidence of inflammation, acute abnormality - EEG to assess for potential epileptogenicity - As patient doesn't have clear ams, neck stiffness, I do not think patient needs a LP - Discussed with patient and wife that there is no clear evidence based guildeline for management of COVID 19 related "brain fog". There ae case reports and case series of potentially using IVIG for symptomatic management. - Will await results of MRI and eeg before making any further recommendations  ADDENDUM - Reviewed MRI Brain w and wo contrast: no acute abnormality - Reviewed EEG: no ictal-interictal abnormality - Discussed with patient's wife who agrees that as patient is already improving clinically, has minimal to no deficits on exam and his tests are normal, he is OK to be discharged from neurology standpoint - Please return to ED if you have any further episodes of alteration of awareness, seizure like activity, headache, mental status changes. - F/u with neurology as outpatient  if symptoms persist  Thank you for allowing [place] to participate in the care of this patient.  Neurology will follow.  Please page neuro hospitalist for any further questions after 5 PM.  [name]

## 2019-09-13 NOTE — Discharge Instructions (Addendum)
Your MRI and EEG here are normal - there is no evidence of a stroke or seizure Please follow up with your doctor Return if you are worsening

## 2019-09-13 NOTE — ED Notes (Signed)
Bryam Bandt Wife [phone]

## 2019-09-13 NOTE — ED Triage Notes (Signed)
States since he has Covid he feels like he is in "slow motion" difficult to think and process information. Has had a lot of fatigue

## 2019-09-13 NOTE — Procedures (Signed)
Patient Name: Raymond Kelly  MRN: [id number]  Epilepsy Attending: Lora Havens  Referring Physician/Provider: Dr Zeb Comfort Date: 09/13/2019 Duration: 23.26 mins  Patient history: 49 year old male with recent COVID-106 infection presented with altered mental status. EEG to evaluate for seizure.   Level of alertness: awake  AEDs during EEG study: None  Technical aspects: This EEG study was done with scalp electrodes positioned according to the 10-20 International system of electrode placement. Electrical activity was acquired at a sampling rate of 500Hz  and reviewed with a high frequency filter of 70Hz  and a low frequency filter of 1Hz . EEG data were recorded continuously and digitally stored.   DESCRIPTION:  The posterior dominant rhythm consists of 9 Hz activity of moderate voltage (25-35 uV) seen predominantly in posterior head regions, symmetric and reactive to eye opening and eye closing.         Hyperventilation and photic stimulation were not performed.  IMPRESSION: This study is within normal limits. No seizures or epileptiform discharges were seen throughout the recording.  Adrienne Delay Barbra Sarks

## 2019-09-13 NOTE — Progress Notes (Signed)
EEG complete - results pending 

## 2019-09-13 NOTE — ED Notes (Signed)
EEG at bedside.

## 2019-09-13 NOTE — ED Provider Notes (Signed)
Pt transferred POV from [facility] for MRI.   Please see previous ED PA note for additional information: Patient presenting for evaluation of altered mental status.  Per information provided by the wife at the bedside the patient has been more "spaced out", staring spells, borderline aphasic.  No focal neurologic deficits on my assessment but he does appear mildly confused, somewhat slow to answer questions and follow commands.  Lab work reviewed and interpreted by myself shows no leukocytosis, no anemia, no metabolic derangements.  He has chronic mild elevation in his LFTs which is about baseline.  UA does not suggest UTI.  His UDS was negative.  His ammonia level is within normal limits.  His chest x-ray shows possible progression of bronchopneumonia.  He has been on a course of antibiotics for this but it could be reasonable to switch him to a different course.  Head CT shows no acute intracranial abnormalities.  CONSULT: Spoke with Dr. Cheral Marker with neurology.  He recommends transfer to [facility] for MRI of brain with contrast and emergent EEG.  He is requesting that the neurology service received a phone call when the patient is in the department so that they may evaluate him in person.  If work-up during he recommends close outpatient neurology follow-up. Physical Exam  BP 139/84 (BP Location: Right Arm)   Pulse (!) 52   Temp 97.6 F (36.4 C) (Oral)   Resp 16   SpO2 100%   Physical Exam Vitals and nursing note reviewed.  Constitutional:      Appearance: He is not ill-appearing.  HENT:     Head: Normocephalic and atraumatic.  Eyes:     Extraocular Movements: Extraocular movements intact.     Conjunctiva/sclera: Conjunctivae normal.     Pupils: Pupils are equal, round, and reactive to light.  Cardiovascular:     Rate and Rhythm: Normal rate and regular rhythm.     Pulses: Normal pulses.  Pulmonary:     Effort: Pulmonary effort is normal.     Breath sounds: Normal breath sounds.  No wheezing, rhonchi or rales.  Skin:    General: Skin is warm and dry.     Coloration: Skin is not jaundiced.  Neurological:     Mental Status: He is alert.     Comments: CN 3-12 grossly intact A&O x4 GCS 15 Sensation and strength intact Coordination with finger-to-nose WNL Neg romberg, neg pronator drift     ED Course/Procedures     Procedures  Results for orders placed or performed during the hospital encounter of 09/13/19  Comprehensive metabolic panel  Result Value Ref Range   Sodium 138 135 - 145 mmol/L   Potassium 4.0 3.5 - 5.1 mmol/L   Chloride 106 98 - 111 mmol/L   CO2 25 22 - 32 mmol/L   Glucose, Bld 102 (H) 70 - 99 mg/dL   BUN 14 6 - 20 mg/dL   Creatinine, Ser 1.03 0.61 - 1.24 mg/dL   Calcium 8.8 (L) 8.9 - 10.3 mg/dL   Total Protein 6.9 6.5 - 8.1 g/dL   Albumin 3.8 3.5 - 5.0 g/dL   AST 59 (H) 15 - 41 U/L   ALT 118 (H) 0 - 44 U/L   Alkaline Phosphatase 144 (H) 38 - 126 U/L   Total Bilirubin 1.2 0.3 - 1.2 mg/dL   GFR calc non Af Amer >60 >60 mL/min   GFR calc Af Amer >60 >60 mL/min   Anion gap 7 5 - 15  CBC with  Differential  Result Value Ref Range   WBC 5.9 4.0 - 10.5 K/uL   RBC 4.97 4.22 - 5.81 MIL/uL   Hemoglobin 15.6 13.0 - 17.0 g/dL   HCT 45.5 39.0 - 52.0 %   MCV 91.5 80.0 - 100.0 fL   MCH 31.4 26.0 - 34.0 pg   MCHC 34.3 30.0 - 36.0 g/dL   RDW 11.7 11.5 - 15.5 %   Platelets 335 150 - 400 K/uL   nRBC 0.0 0.0 - 0.2 %   Neutrophils Relative % 61 %   Neutro Abs 3.6 1.7 - 7.7 K/uL   Lymphocytes Relative 22 %   Lymphs Abs 1.3 0.7 - 4.0 K/uL   Monocytes Relative 12 %   Monocytes Absolute 0.7 0.1 - 1.0 K/uL   Eosinophils Relative 2 %   Eosinophils Absolute 0.1 0.0 - 0.5 K/uL   Basophils Relative 1 %   Basophils Absolute 0.0 0.0 - 0.1 K/uL   Immature Granulocytes 2 %   Abs Immature Granulocytes 0.12 (H) 0.00 - 0.07 K/uL  Lactic acid, plasma  Result Value Ref Range   Lactic Acid, Venous 1.3 0.5 - 1.9 mmol/L  Urinalysis, Routine w reflex  microscopic  Result Value Ref Range   Color, Urine YELLOW YELLOW   APPearance CLEAR CLEAR   Specific Gravity, Urine 1.020 1.005 - 1.030   pH 6.0 5.0 - 8.0   Glucose, UA NEGATIVE NEGATIVE mg/dL   Hgb urine dipstick NEGATIVE NEGATIVE   Bilirubin Urine NEGATIVE NEGATIVE   Ketones, ur NEGATIVE NEGATIVE mg/dL   Protein, ur NEGATIVE NEGATIVE mg/dL   Nitrite NEGATIVE NEGATIVE   Leukocytes,Ua NEGATIVE NEGATIVE  Rapid urine drug screen (hospital performed)  Result Value Ref Range   Opiates NONE DETECTED NONE DETECTED   Cocaine NONE DETECTED NONE DETECTED   Benzodiazepines NONE DETECTED NONE DETECTED   Amphetamines NONE DETECTED NONE DETECTED   Tetrahydrocannabinol NONE DETECTED NONE DETECTED   Barbiturates NONE DETECTED NONE DETECTED  Ammonia  Result Value Ref Range   Ammonia 24 9 - 35 umol/L    MDM  On reevaluation pt states he feels "improved." No focal neuro deficits on repeat exam and pt does not appear confused or slow to answer questions currently. MRI has been ordered. Will consult neurology to evaluate patient at bedside  Discussed case with Dr. Cheral Marker neurologist who will come evaluate patient. MRI pending. Will put in recommendations.   3:50 PM At shift change case signed out to Janetta Hora, PA-C, who will dispo patient accordingly after MRI and neuro reccs.        Eustaquio Maize, PA-C 09/13/19 1551    Lajean Saver, MD 09/15/19 [phone]

## 2019-09-14 LAB — URINE CULTURE: Culture: NO GROWTH

## 2020-02-12 ENCOUNTER — Ambulatory Visit (HOSPITAL_COMMUNITY)
Admission: EM | Admit: 2020-02-12 | Discharge: 2020-02-12 | Disposition: A | Discharge status: Home / Self Care | Payer: 59 | Attending: Emergency Medicine | Admitting: Emergency Medicine

## 2020-02-12 ENCOUNTER — Encounter (HOSPITAL_COMMUNITY): Payer: Self-pay

## 2020-02-12 ENCOUNTER — Other Ambulatory Visit: Payer: Self-pay

## 2020-02-12 DIAGNOSIS — H5789 Other specified disorders of eye and adnexa: Secondary | ICD-10-CM | POA: Diagnosis not present

## 2020-02-12 MED ORDER — TETRACAINE HCL 0.5 % OP SOLN
OPHTHALMIC | Status: AC
  Filled 2020-02-12: qty 4

## 2020-02-12 MED ORDER — TOBRAMYCIN-DEXAMETHASONE 0.3-0.1 % OP SUSP: 1.0000 [drp] | Freq: Four times a day (QID) | OPHTHALMIC | 0 refills | Status: AC

## 2020-02-12 MED ORDER — FLUORESCEIN SODIUM 1 MG OP STRP
ORAL_STRIP | OPHTHALMIC | Status: AC
  Filled 2020-02-12: qty 1

## 2020-02-12 NOTE — Discharge Instructions (Signed)
I do not see any foreign objects (bug included) in the eye.  I am providing a drop which is antibiotic and steroid which I feel will help with healing and resolution of the irritation following the incident with bug in your eye last evening.  You may additionally use any artificial tears for comfort as well.  If symptoms worsen or do not improve in the next week to return to be seen or to follow up with ophthalmology.

## 2020-02-12 NOTE — ED Provider Notes (Signed)
[place]    CSN: [id number] Arrival date & time: 02/12/20  1130      History   Chief Complaint Chief Complaint  Patient presents with  . Foreign Body in [place] is a 49 y.o. male.   Raymond Kelly presents with complaints of left eye concerns. Bug got into eye last evening, he flushed eye and manually removed what looked like a bug leg, from the eye last evening. Noted a sac like appearing substance to inner eye this morning. No drainage, but some tearing. Very mild sensation of irritation to inside of lower lid. No vision changes. Doesn't wear contacts. No eye ball pain.    ROS per HPI, negative if not otherwise mentioned.      Past Medical History:  Diagnosis Date  . Family history of adverse reaction to anesthesia    mother slow to awaken  . History of kidney stones 2007    Patient Active Problem List   Diagnosis Date Noted  . Hyperbilirubinemia 02/04/2016  . Bile leak, postoperative     Past Surgical History:  Procedure Laterality Date  . CHOLECYSTECTOMY N/A 02/01/2016   Procedure: LAPAROSCOPIC CHOLECYSTECTOMY WITH INTRAOPERATIVE CHOLANGIOGRAM;  Surgeon: Coralie Keens, MD;  Location: [facility];  Service: General;  Laterality: N/A;  . cyst removed     between his front teeth and cyst removed from abdomal area  . ESOPHAGOGASTRODUODENOSCOPY (EGD) WITH PROPOFOL N/A 03/06/2016   Procedure: ESOPHAGOGASTRODUODENOSCOPY (EGD) WITH PROPOFOL;  Surgeon: Doran Stabler, MD;  Location: [facility];  Service: Gastroenterology;  Laterality: N/A;  with stent removal.  . GASTROINTESTINAL STENT REMOVAL N/A 03/06/2016   Procedure: GASTROINTESTINAL STENT REMOVAL;  Surgeon: Doran Stabler, MD;  Location: [facility];  Service: Gastroenterology;  Laterality: N/A;  . TONSILLECTOMY    . ULNAR NERVE TRANSPOSITION         Home Medications    Prior to Admission medications   Medication Sig Start Date End Date Taking? Authorizing Provider    amoxicillin (AMOXIL) 125 MG/5ML suspension Take by mouth 3 (three) times daily.    [provider]  chlorpheniramine-HYDROcodone (TUSSIONEX PENNKINETIC ER) 10-8 MG/5ML SUER Take 5 mLs by mouth every 12 (twelve) hours as needed for cough. 09/07/19   Molpus, John, MD  docusate sodium (COLACE) 100 MG capsule Take 100 mg by mouth 2 (two) times daily as needed for mild constipation.    [provider]  tobramycin-dexamethasone [facility]) ophthalmic solution Place 1 drop into the left eye every 6 (six) hours for 5 days. 02/12/20 02/17/20  Zigmund Gottron, NP  simethicone (MYLICON) 80 MG chewable tablet Chew 80-160 mg by mouth every 6 (six) hours as needed for flatulence.  09/07/19  [provider]    Family History Family History  Family history unknown: Yes    Social History Social History   Tobacco Use  . Smoking status: Never Smoker  . Smokeless tobacco: Never Used  Vaping Use  . Vaping Use: Never used  Substance Use Topics  . Alcohol use: Not Currently    Comment: social at times  . Drug use: No     Allergies   No known allergies   Review of Systems Review of Systems   Physical Exam Triage Vital Signs ED Triage Vitals  Enc Vitals Group     BP 02/12/20 1155 132/86     Pulse Rate 02/12/20 1155 84     Resp 02/12/20 1155 18  Temp 02/12/20 1155 98.2 F (36.8 C)     Temp Source 02/12/20 1155 Oral     SpO2 02/12/20 1155 94 %     Weight --      Height --      Head Circumference --      Peak Flow --      Pain Score 02/12/20 1153 3     Pain Loc --      Pain Edu? --      Excl. in [place]? --    No data found.  Updated Vital Signs BP 132/86 (BP Location: Right Arm)   Pulse 84   Temp 98.2 F (36.8 C) (Oral)   Resp 18   SpO2 94%   Visual Acuity Right Eye Distance:   Left Eye Distance:   Bilateral Distance:    Right Eye Near:   Left Eye Near:    Bilateral Near:     Physical Exam Constitutional:      Appearance: He is well-developed.   Eyes:     General: Lids are normal. Lids are everted, no foreign bodies appreciated. Vision grossly intact. Gaze aligned appropriately.        Left eye: No foreign body.     Conjunctiva/sclera:     Left eye: Left conjunctiva is injected.     Pupils: Pupils are equal, round, and reactive to light.     Comments: Tearing noted to left eye with very mild injection; no visible foreign body, no obvious significant abrasion; some thick clear discharge noted, particularly with pooling to the lower lid   Cardiovascular:     Rate and Rhythm: Normal rate.  Pulmonary:     Effort: Pulmonary effort is normal.  Skin:    General: Skin is warm and dry.  Neurological:     Mental Status: He is alert and oriented to person, place, and time.      UC Treatments / Results  Labs (all labs ordered are listed, but only abnormal results are displayed) Labs Reviewed - No data to display  EKG   Radiology No results found.  Procedures Procedures (including critical care time)  Medications Ordered in UC Medications - No data to display  Initial Impression / Assessment and Plan / UC Course  I have reviewed the triage vital signs and the nursing notes.  Pertinent labs & imaging results that were available during my care of the patient were reviewed by me and considered in my medical decision making (see chart for details).     Mild injection and tearing s/p bug to eye. No significant abrasion, no f/b presence. tobradex provided. Follow up recommendations provided Return precautions provided. Patient verbalized understanding and agreeable to plan.    Final Clinical Impressions(s) / UC Diagnoses   Final diagnoses:  Irritation of left eye     Discharge Instructions     I do not see any foreign objects (bug included) in the eye.  I am providing a drop which is antibiotic and steroid which I feel will help with healing and resolution of the irritation following the incident with bug in your eye  last evening.  You may additionally use any artificial tears for comfort as well.  If symptoms worsen or do not improve in the next week to return to be seen or to follow up with ophthalmology.     ED Prescriptions    Medication Sig Dispense Auth. Provider   tobramycin-dexamethasone [facility]) ophthalmic solution Place 1 drop into the left eye every  6 (six) hours for 5 days. 5 mL Zigmund Gottron, NP     PDMP not reviewed this encounter.   Zigmund Gottron, NP 02/12/20 2300

## 2020-02-12 NOTE — ED Triage Notes (Signed)
Pt presents with foreign body in left eye; pt states a bug flew in his eye and it still feels kind if irritated and he is not sure if he got everything out.

## 2020-06-12 ENCOUNTER — Ambulatory Visit (INDEPENDENT_AMBULATORY_CARE_PROVIDER_SITE_OTHER): Payer: 59 | Admitting: Physician Assistant

## 2020-06-12 ENCOUNTER — Encounter: Payer: Self-pay | Admitting: Physician Assistant

## 2020-06-12 VITALS — BP 120/78 | HR 71 | Ht 68.0 in | Wt 200.0 lb

## 2020-06-12 DIAGNOSIS — K625 Hemorrhage of anus and rectum: Secondary | ICD-10-CM

## 2020-06-12 DIAGNOSIS — R194 Change in bowel habit: Secondary | ICD-10-CM | POA: Diagnosis not present

## 2020-06-12 DIAGNOSIS — R1030 Lower abdominal pain, unspecified: Secondary | ICD-10-CM | POA: Diagnosis not present

## 2020-06-12 MED ORDER — NA SULFATE-K SULFATE-MG SULF 17.5-3.13-1.6 GM/177ML PO SOLN: 1.0000 | Freq: Once | ORAL | 0 refills | Status: AC

## 2020-06-12 NOTE — Patient Instructions (Signed)
If you are age 51 or older, your body mass index should be between 23-30. Your Body mass index is 30.41 kg/m. If this is out of the aforementioned range listed, please consider follow up with your Primary Care Provider.  If you are age 62 or younger, your body mass index should be between 19-25. Your Body mass index is 30.41 kg/m. If this is out of the aformentioned range listed, please consider follow up with your Primary Care Provider.   You have been scheduled for a colonoscopy. Please follow written instructions given to you at your visit today.  Please pick up your prep supplies at the pharmacy within the next 1-3 days. If you use inhalers (even only as needed), please bring them with you on the day of your procedure.  Thank you for choosing me and [facility].  Ellouise Newer , PA-C

## 2020-06-12 NOTE — Progress Notes (Signed)
Chief Complaint: Abdominal pain  HPI:    Raymond Kelly is a 49 year old Caucasian male, known to Dr. Loletha Carrow, with a past medical history as listed below, who presents to clinic with a complaint of abdominal pain.      03/06/2016 EGD with biliary stent removal from a previous bile leak with normal esophagus, normal stomach and a plastic stent was removed from the duodenum.    05/19/2020 patient saw PCP in regards to pelvic pain.  At that time described 3 days of bladder spasm with some suprapubic tenderness.  Was noted he had a history of prostatitis in the past and saw urology for this.  At that time his prostate exam and urinalysis were negative.  He was started on Cipro twice a day for a week.  Labs returned normal.    Today, the patient presents to clinic and tells me that he had what he describes as a "spasm" in his lower abdomen it felt like it could be near his bladder about a week before seeing his PCP as above.  Tells me that he assumed that it was prostatitis as he has had this before and he was given Cipro but he only took 1 pill before getting a phone call that likely this was not the case due to all of his labs being normal.  Explains that after a week all of this was better but then he did notice some mucus in his stool which at first was clear and then yellow with some twinges of bright red blood.  He has not seen this now for the past few weeks but is worried about what could be going on because "you do the search online and it could be colon cancer".  He does tell me he did a Cologuard last year which was negative.  He has never had a colonoscopy.    Denies fever, chills, weight loss or symptoms that awaken him from sleep.  Past Medical History:  Diagnosis Date  . Family history of adverse reaction to anesthesia    mother slow to awaken  . History of kidney stones 2007    Past Surgical History:  Procedure Laterality Date  . CHOLECYSTECTOMY N/A 02/01/2016   Procedure: LAPAROSCOPIC  CHOLECYSTECTOMY WITH INTRAOPERATIVE CHOLANGIOGRAM;  Surgeon: Coralie Keens, MD;  Location: [facility];  Service: General;  Laterality: N/A;  . cyst removed     between his front teeth and cyst removed from abdomal area  . ESOPHAGOGASTRODUODENOSCOPY (EGD) WITH PROPOFOL N/A 03/06/2016   Procedure: ESOPHAGOGASTRODUODENOSCOPY (EGD) WITH PROPOFOL;  Surgeon: Doran Stabler, MD;  Location: [facility];  Service: Gastroenterology;  Laterality: N/A;  with stent removal.  . GASTROINTESTINAL STENT REMOVAL N/A 03/06/2016   Procedure: GASTROINTESTINAL STENT REMOVAL;  Surgeon: Doran Stabler, MD;  Location: [facility];  Service: Gastroenterology;  Laterality: N/A;  . TONSILLECTOMY    . ULNAR NERVE TRANSPOSITION      Current Outpatient Medications  Medication Sig Dispense Refill  . amoxicillin (AMOXIL) 125 MG/5ML suspension Take by mouth 3 (three) times daily.    . chlorpheniramine-HYDROcodone (TUSSIONEX PENNKINETIC ER) 10-8 MG/5ML SUER Take 5 mLs by mouth every 12 (twelve) hours as needed for cough. 70 mL 0  . docusate sodium (COLACE) 100 MG capsule Take 100 mg by mouth 2 (two) times daily as needed for mild constipation.     No current facility-administered medications for this visit.    Allergies as of 06/12/2020 - Review Complete 02/12/2020  Allergen Reaction Noted  .  No known allergies  01/31/2016    Family History  Family history unknown: Yes    Social History   Socioeconomic History  . Marital status: Married    Spouse name: Not on file  . Number of children: Not on file  . Years of education: Not on file  . Highest education level: Not on file  Occupational History  . Not on file  Tobacco Use  . Smoking status: Never Smoker  . Smokeless tobacco: Never Used  Vaping Use  . Vaping Use: Never used  Substance and Sexual Activity  . Alcohol use: Not Currently    Comment: social at times  . Drug use: No  . Sexual activity: Not on file  Other Topics Concern  . Not on file    Social History Narrative  . Not on file   Social Determinants of Health   Financial Resource Strain:   . Difficulty of Paying Living Expenses: Not on file  Food Insecurity:   . Worried About Charity fundraiser in the Last Year: Not on file  . Ran Out of Food in the Last Year: Not on file  Transportation Needs:   . Lack of Transportation (Medical): Not on file  . Lack of Transportation (Non-Medical): Not on file  Physical Activity:   . Days of Exercise per Week: Not on file  . Minutes of Exercise per Session: Not on file  Stress:   . Feeling of Stress : Not on file  Social Connections:   . Frequency of Communication with Friends and Family: Not on file  . Frequency of Social Gatherings with Friends and Family: Not on file  . Attends Religious Services: Not on file  . Active Member of Clubs or Organizations: Not on file  . Attends Archivist Meetings: Not on file  . Marital Status: Not on file  Intimate Partner Violence:   . Fear of Current or Ex-Partner: Not on file  . Emotionally Abused: Not on file  . Physically Abused: Not on file  . Sexually Abused: Not on file    Review of Systems:    Constitutional: No weight loss, fever or chills Skin: No rash  Cardiovascular: No chest pain Respiratory: No SOB  Gastrointestinal: See HPI and otherwise negative Genitourinary: No dysuria  Neurological: No headache, dizziness or syncope Musculoskeletal: No new muscle or joint pain Hematologic: No bruising Psychiatric: +anxiety   Physical Exam:  Vital signs: BP 120/78   Pulse 71   Ht 5\' 8"  (1.727 m)   Wt 200 lb (90.7 kg)   BMI 30.41 kg/m   Constitutional:   Pleasant Caucasian male appears to be in NAD, Well developed, Well nourished, alert and cooperative Head:  Normocephalic and atraumatic. Eyes:   PEERL, EOMI. No icterus. Conjunctiva pink. Ears:  Normal auditory acuity. Neck:  Supple Throat: Oral cavity and pharynx without inflammation, swelling or lesion.   Respiratory: Respirations even and unlabored. Lungs clear to auscultation bilaterally.   No wheezes, crackles, or rhonchi.  Cardiovascular: Normal S1, S2. No MRG. Regular rate and rhythm. No peripheral edema, cyanosis or pallor.  Gastrointestinal:  Soft, nondistended, nontender. No rebound or guarding. Normal bowel sounds. No appreciable masses or hepatomegaly. Rectal:  Not performed.  Msk:  Symmetrical without gross deformities. Without edema, no deformity or joint abnormality.  Neurologic:  Alert and  oriented x4;  grossly normal neurologically.  Skin:   Dry and intact without significant lesions or rashes. Psychiatric: Demonstrates good judgement and reason without abnormal  affect or behaviors.  RELEVANT LABS AND IMAGING: CBC    Component Value Date/Time   WBC 5.9 09/13/2019 0941   RBC 4.97 09/13/2019 0941   HGB 15.6 09/13/2019 0941   HCT 45.5 09/13/2019 0941   PLT 335 09/13/2019 0941   MCV 91.5 09/13/2019 0941   MCH 31.4 09/13/2019 0941   MCHC 34.3 09/13/2019 0941   RDW 11.7 09/13/2019 0941   LYMPHSABS 1.3 09/13/2019 0941   MONOABS 0.7 09/13/2019 0941   EOSABS 0.1 09/13/2019 0941   BASOSABS 0.0 09/13/2019 0941    CMP     Component Value Date/Time   NA 138 09/13/2019 0941   K 4.0 09/13/2019 0941   CL 106 09/13/2019 0941   CO2 25 09/13/2019 0941   GLUCOSE 102 (H) 09/13/2019 0941   BUN 14 09/13/2019 0941   CREATININE 1.03 09/13/2019 0941   CALCIUM 8.8 (L) 09/13/2019 0941   PROT 6.9 09/13/2019 0941   ALBUMIN 3.8 09/13/2019 0941   AST 59 (H) 09/13/2019 0941   ALT 118 (H) 09/13/2019 0941   ALKPHOS 144 (H) 09/13/2019 0941   BILITOT 1.2 09/13/2019 0941   GFRNONAA >60 09/13/2019 0941   GFRAA >60 09/13/2019 0941    Assessment: 1.  Lower abdominal pain: For about a week about a month ago, none further, felt like a spasm followed by a change in bowel habits 2.  Change in bowel habits: With mucus and some bright red blood about 3 weeks ago; consider IBS with hemorrhoids  versus other 3.  Bright red blood per rectum  Plan: 1.  Scheduled patient for diagnostic colonoscopy given change in bowel habits, bright red blood per rectum and some lower abdominal pain.  This was scheduled with Dr. Loletha Carrow in the [facility].  Did provide the patient with a list of detailed risks and he agrees to proceed.  He will be Covid tested 2 days prior to time of procedure. 2.  Patient to follow in clinic per recommendations from Dr. Loletha Carrow after above.  Ellouise Newer, PA-C [facility] 06/12/2020, 8:23 AM

## 2020-06-12 NOTE — Progress Notes (Signed)
____________________________________________________________  Attending physician addendum:  Thank you for sending this case to me. I have reviewed the entire note and agree with the plan.   Mylynn Dinh Danis, MD  ____________________________________________________________  

## 2020-06-14 ENCOUNTER — Encounter: Payer: Self-pay | Admitting: Gastroenterology

## 2020-06-18 ENCOUNTER — Telehealth: Payer: Self-pay | Admitting: Physician Assistant

## 2020-06-18 ENCOUNTER — Other Ambulatory Visit: Payer: Self-pay | Admitting: Gastroenterology

## 2020-06-18 LAB — SARS CORONAVIRUS 2 (TAT 6-24 HRS): SARS Coronavirus 2: NEGATIVE

## 2020-06-18 NOTE — Telephone Encounter (Signed)
Inbound call from patient stated he was scheduled to have covid test on 06/16/20 but when he went practice was closed; they do not open on weekends.  Called today and was informed can have covid test today but wants to know if that will be enough time to get results back, his procedure is scheduled for tomorrow 06/19/20 at 2:30pm.  Please advise.

## 2020-06-18 NOTE — Telephone Encounter (Signed)
Informed patient he could go get his Covid test today

## 2020-06-19 ENCOUNTER — Other Ambulatory Visit: Payer: Self-pay

## 2020-06-19 ENCOUNTER — Ambulatory Visit (AMBULATORY_SURGERY_CENTER): Payer: 59 | Admitting: Gastroenterology

## 2020-06-19 ENCOUNTER — Encounter: Payer: Self-pay | Admitting: Gastroenterology

## 2020-06-19 VITALS — BP 135/83 | HR 55 | Temp 97.3°F | Resp 13 | Ht 68.0 in | Wt 200.0 lb

## 2020-06-19 DIAGNOSIS — K625 Hemorrhage of anus and rectum: Secondary | ICD-10-CM | POA: Diagnosis not present

## 2020-06-19 MED ORDER — SODIUM CHLORIDE 0.9 % IV SOLN: 500.0000 mL | Freq: Once | INTRAVENOUS | Status: DC

## 2020-06-19 NOTE — Progress Notes (Signed)
VS by CW. ?

## 2020-06-19 NOTE — Op Note (Signed)
[facility] Patient Name: Raymond Kelly Procedure Date: 06/19/2020 2:08 PM MRN: [id number] Endoscopist: Mallie Mussel L. Loletha Carrow , MD Age: 49 Referring MD:  Date of Birth: 02/16/1971 Gender: Male Account #: [id number] Procedure:                Colonoscopy Indications:              Pelvic pain, Rectal bleeding (self-limited recent                            episode, initially thought to be urologic, bloody                            mucus per rectum during latter part of the episode) Medicines:                Monitored Anesthesia Care Procedure:                Pre-Anesthesia Assessment:                           - Prior to the procedure, a History and Physical                            was performed, and patient medications and                            allergies were reviewed. The patient's tolerance of                            previous anesthesia was also reviewed. The risks                            and benefits of the procedure and the sedation                            options and risks were discussed with the patient.                            All questions were answered, and informed consent                            was obtained. Prior Anticoagulants: The patient has                            taken no previous anticoagulant or antiplatelet                            agents. ASA Grade Assessment: II - A patient with                            mild systemic disease. After reviewing the risks                            and benefits, the patient was deemed in  satisfactory condition to undergo the procedure.                           After obtaining informed consent, the colonoscope                            was passed under direct vision. Throughout the                            procedure, the patient's blood pressure, pulse, and                            oxygen saturations were monitored continuously. The                            Colonoscope  was introduced through the anus and                            advanced to the the terminal ileum, with                            identification of the appendiceal orifice and IC                            valve. The colonoscopy was performed without                            difficulty. The patient tolerated the procedure                            well. The quality of the bowel preparation was                            excellent. The terminal ileum, ileocecal valve,                            appendiceal orifice, and rectum were photographed. Scope In: 2:23:17 PM Scope Out: 2:34:17 PM Scope Withdrawal Time: 0 hours 8 minutes 58 seconds  Total Procedure Duration: 0 hours 11 minutes 0 seconds  Findings:                 The perianal and digital rectal examinations were                            normal.                           The terminal ileum appeared normal.                           The entire examined colon appeared normal on direct                            and retroflexion views. Complications:            No immediate complications. Estimated  Blood Loss:     Estimated blood loss: none. Impression:               - The examined portion of the ileum was normal.                           - The entire examined colon is normal on direct and                            retroflexion views.                           - No specimens collected.                           Cause of episode unknown, perhaps acute infectious                            illness now resolved. Recommendation:           - Patient has a contact number available for                            emergencies. The signs and symptoms of potential                            delayed complications were discussed with the                            patient. Return to normal activities tomorrow.                            Written discharge instructions were provided to the                            patient.                            - Resume previous diet.                           - Continue present medications.                           - Repeat colonoscopy in 10 years for screening                            purposes. Koraline Phillipson L. Loletha Carrow, MD 06/19/2020 2:39:44 PM This report has been signed electronically.

## 2020-06-19 NOTE — Patient Instructions (Signed)
Discharge instructions given. Normal exam. Resume previous medications. YOU HAD AN ENDOSCOPIC PROCEDURE TODAY AT THE [facility]:   Refer to the procedure report that was given to you for any specific questions about what was found during the examination.  If the procedure report does not answer your questions, please call your gastroenterologist to clarify.  If you requested that your care partner not be given the details of your procedure findings, then the procedure report has been included in a sealed envelope for you to review at your convenience later.  YOU SHOULD EXPECT: Some feelings of bloating in the abdomen. Passage of more gas than usual.  Walking can help get rid of the air that was put into your GI tract during the procedure and reduce the bloating. If you had a lower endoscopy (such as a colonoscopy or flexible sigmoidoscopy) you may notice spotting of blood in your stool or on the toilet paper. If you underwent a bowel prep for your procedure, you may not have a normal bowel movement for a few days.  Please Note:  You might notice some irritation and congestion in your nose or some drainage.  This is from the oxygen used during your procedure.  There is no need for concern and it should clear up in a day or so.  SYMPTOMS TO REPORT IMMEDIATELY:  Following lower endoscopy (colonoscopy or flexible sigmoidoscopy):  Excessive amounts of blood in the stool  Significant tenderness or worsening of abdominal pains  Swelling of the abdomen that is new, acute  Fever of 100F or higher   For urgent or emergent issues, a gastroenterologist can be reached at any hour by calling [phone]. Do not use MyChart messaging for urgent concerns.    DIET:  We do recommend a small meal at first, but then you may proceed to your regular diet.  Drink plenty of fluids but you should avoid alcoholic beverages for 24 hours.  ACTIVITY:  You should plan to take it easy for the rest of  today and you should NOT DRIVE or use heavy machinery until tomorrow (because of the sedation medicines used during the test).    FOLLOW UP: Our staff will call the number listed on your records 48-72 hours following your procedure to check on you and address any questions or concerns that you may have regarding the information given to you following your procedure. If we do not reach you, we will leave a message.  We will attempt to reach you two times.  During this call, we will ask if you have developed any symptoms of COVID 19. If you develop any symptoms (ie: fever, flu-like symptoms, shortness of breath, cough etc.) before then, please call [phone].  If you test positive for Covid 19 in the 2 weeks post procedure, please call and report this information to us.    If any biopsies were taken you will be contacted by phone or by letter within the next 1-3 weeks.  Please call us at [phone] if you have not heard about the biopsies in 3 weeks.    SIGNATURES/CONFIDENTIALITY: You and/or your care partner have signed paperwork which will be entered into your electronic medical record.  These signatures attest to the fact that that the information above on your After Visit Summary has been reviewed and is understood.  Full responsibility of the confidentiality of this discharge information lies with you and/or your care-partner.  

## 2020-06-19 NOTE — Progress Notes (Signed)
PT taken to PACU. Monitors in place. VSS. Report given to RN. 

## 2020-06-21 ENCOUNTER — Telehealth: Payer: Self-pay | Admitting: *Deleted

## 2020-06-21 NOTE — Telephone Encounter (Signed)
  Follow up Call-  Call back number 06/19/2020  Post procedure Call Back phone  # [phone]  Permission to leave phone message Yes  Some recent data might be hidden     Patient questions:  Do you have a fever, pain , or abdominal swelling? No. Pain Score  0 *  Have you tolerated food without any problems? Yes.    Have you been able to return to your normal activities? Yes.    Do you have any questions about your discharge instructions: Diet   No. Medications  No. Follow up visit  No.  Do you have questions or concerns about your Care? No.  Actions: * If pain score is 4 or above: No action needed, pain <4.  1. Have you developed a fever since your procedure? No  2.   Have you had an respiratory symptoms (SOB or cough) since your procedure? no  3.   Have you tested positive for COVID 19 since your procedure no  4.   Have you had any family members/close contacts diagnosed with the COVID 19 since your procedure?  no   If yes to any of these questions please route to Joylene John, RN and Joella Prince, RN

## 2021-05-03 ENCOUNTER — Encounter (HOSPITAL_BASED_OUTPATIENT_CLINIC_OR_DEPARTMENT_OTHER): Payer: Self-pay | Admitting: Emergency Medicine

## 2021-05-03 ENCOUNTER — Other Ambulatory Visit: Payer: Self-pay

## 2021-05-03 ENCOUNTER — Emergency Department (HOSPITAL_BASED_OUTPATIENT_CLINIC_OR_DEPARTMENT_OTHER): Payer: 59

## 2021-05-03 ENCOUNTER — Emergency Department (HOSPITAL_BASED_OUTPATIENT_CLINIC_OR_DEPARTMENT_OTHER)
Admission: EM | Admit: 2021-05-03 | Discharge: 2021-05-03 | Disposition: A | Discharge status: Home / Self Care | Payer: 59 | Attending: Emergency Medicine | Admitting: Emergency Medicine

## 2021-05-03 DIAGNOSIS — K5793 Diverticulitis of intestine, part unspecified, without perforation or abscess with bleeding: Secondary | ICD-10-CM | POA: Insufficient documentation

## 2021-05-03 DIAGNOSIS — K5792 Diverticulitis of intestine, part unspecified, without perforation or abscess without bleeding: Secondary | ICD-10-CM

## 2021-05-03 DIAGNOSIS — R1084 Generalized abdominal pain: Secondary | ICD-10-CM

## 2021-05-03 DIAGNOSIS — R945 Abnormal results of liver function studies: Secondary | ICD-10-CM | POA: Diagnosis not present

## 2021-05-03 DIAGNOSIS — R1033 Periumbilical pain: Secondary | ICD-10-CM | POA: Diagnosis present

## 2021-05-03 LAB — CBC
HCT: 45.7 % (ref 39.0–52.0)
Hemoglobin: 15.6 g/dL (ref 13.0–17.0)
MCH: 31.3 pg (ref 26.0–34.0)
MCHC: 34.1 g/dL (ref 30.0–36.0)
MCV: 91.6 fL (ref 80.0–100.0)
Platelets: 244 10*3/uL (ref 150–400)
RBC: 4.99 MIL/uL (ref 4.22–5.81)
RDW: 12.2 % (ref 11.5–15.5)
WBC: 12.1 10*3/uL — ABNORMAL HIGH (ref 4.0–10.5)
nRBC: 0 % (ref 0.0–0.2)

## 2021-05-03 LAB — URINALYSIS, ROUTINE W REFLEX MICROSCOPIC
Bilirubin Urine: NEGATIVE
Glucose, UA: NEGATIVE mg/dL
Hgb urine dipstick: NEGATIVE
Ketones, ur: NEGATIVE mg/dL
Leukocytes,Ua: NEGATIVE
Nitrite: NEGATIVE
Protein, ur: NEGATIVE mg/dL
Specific Gravity, Urine: 1.026 (ref 1.005–1.030)
pH: 5.5 (ref 5.0–8.0)

## 2021-05-03 LAB — COMPREHENSIVE METABOLIC PANEL
ALT: 160 U/L — ABNORMAL HIGH (ref 0–44)
AST: 78 U/L — ABNORMAL HIGH (ref 15–41)
Albumin: 4.2 g/dL (ref 3.5–5.0)
Alkaline Phosphatase: 134 U/L — ABNORMAL HIGH (ref 38–126)
Anion gap: 8 (ref 5–15)
BUN: 13 mg/dL (ref 6–20)
CO2: 28 mmol/L (ref 22–32)
Calcium: 9.3 mg/dL (ref 8.9–10.3)
Chloride: 104 mmol/L (ref 98–111)
Creatinine, Ser: 1.11 mg/dL (ref 0.61–1.24)
GFR, Estimated: >60 mL/min (ref >60)
Glucose, Bld: 120 mg/dL — ABNORMAL HIGH (ref 70–99)
Potassium: 3.7 mmol/L (ref 3.5–5.1)
Sodium: 140 mmol/L (ref 135–145)
Total Bilirubin: 1.6 mg/dL — ABNORMAL HIGH (ref 0.3–1.2)
Total Protein: 6.7 g/dL (ref 6.5–8.1)

## 2021-05-03 LAB — LIPASE, BLOOD: Lipase: 18 U/L (ref 11–51)

## 2021-05-03 IMAGING — CT CT ABD-PELV W/ CM
2 of 5 series · 16 of 46 positions shown, 18 images · IV contrast (APPLIED)
Comparison: [DATE]

CLINICAL DATA: Acute abdominal pain,

EXAM:
CT ABDOMEN AND PELVIS WITH CONTRAST
TECHNIQUE: Multidetector CT imaging of the abdomen and pelvis was performed
using the standard protocol following bolus administration of
intravenous contrast. Sagittal and coronal MPR images reconstructed
from axial data set.
CONTRAST:  100mL OMNIPAQUE IOHEXOL 300 MG/ML SOLN IV. No oral
contrast.

[Series 2: abd pel w · axial · 0.75mm/px · z∈[-472,-56]mm · 13 of 93 slices shown, 15 images]
[im 5/93  soft-tissue]
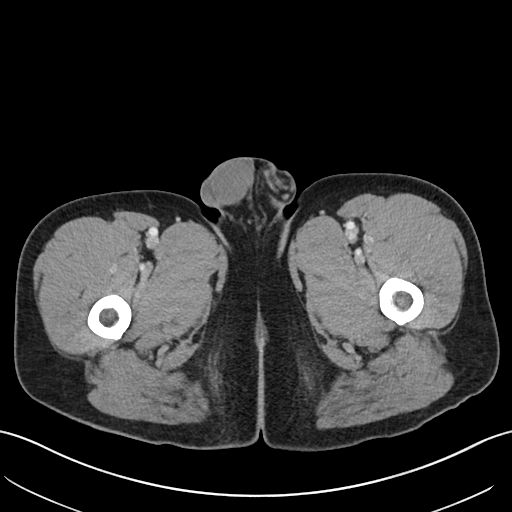
[im 5/93  bone]
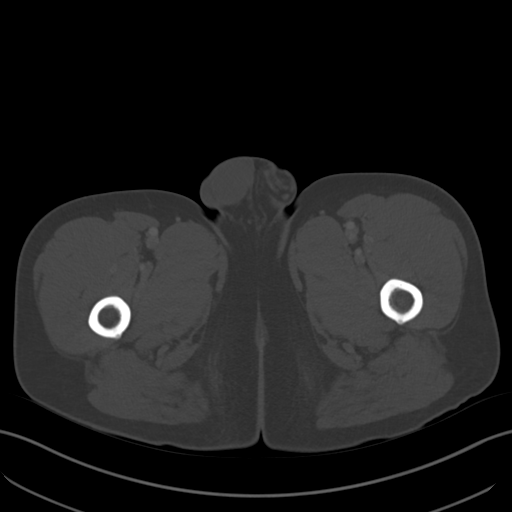
[im 15/93  soft-tissue]
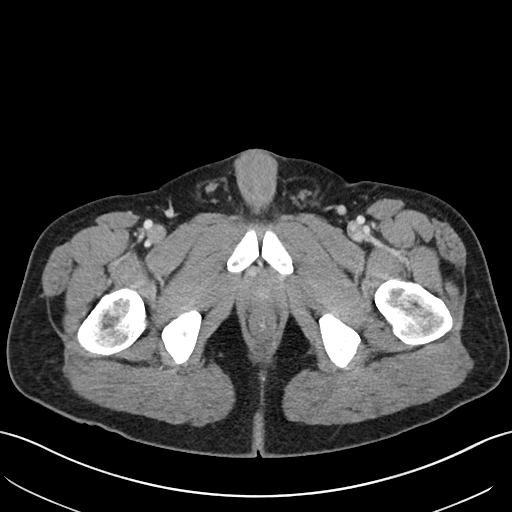
[im 20/93  soft-tissue]
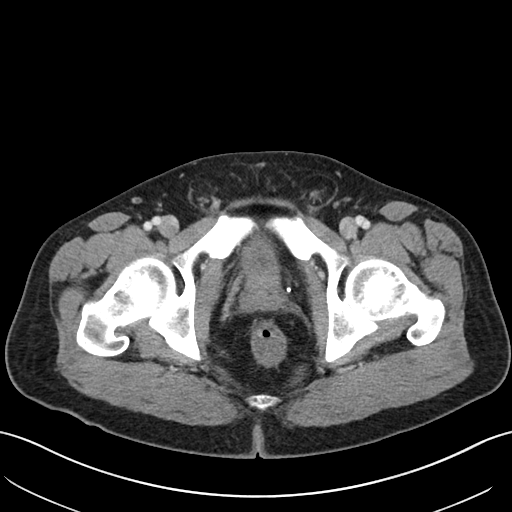
[im 25/93  soft-tissue]
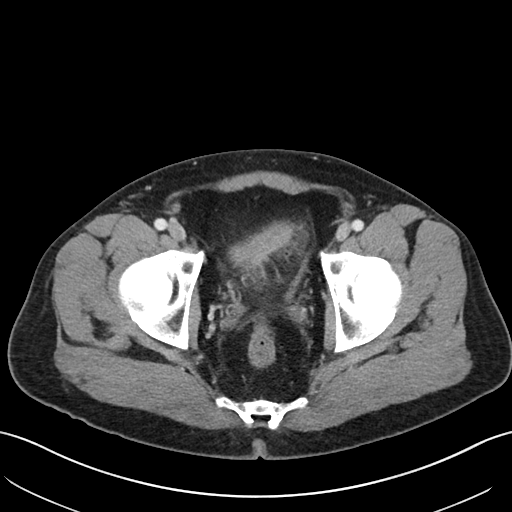
[im 34/93  soft-tissue]
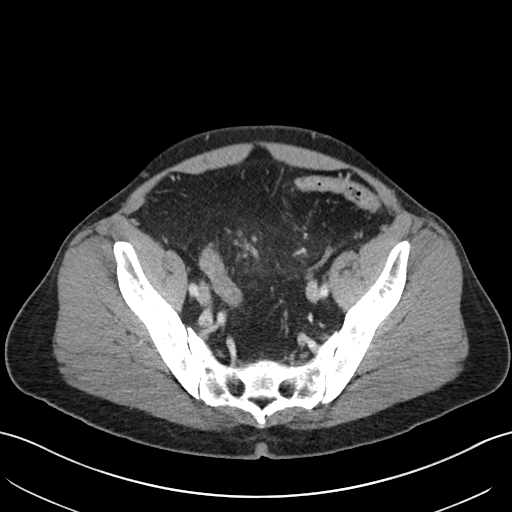
[im 39/93  soft-tissue]
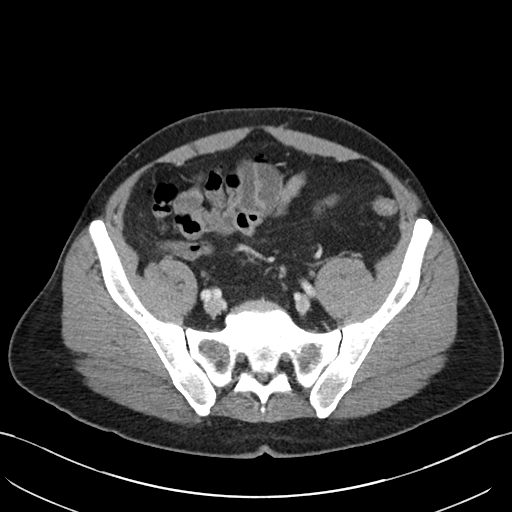
[im 49/93  soft-tissue]
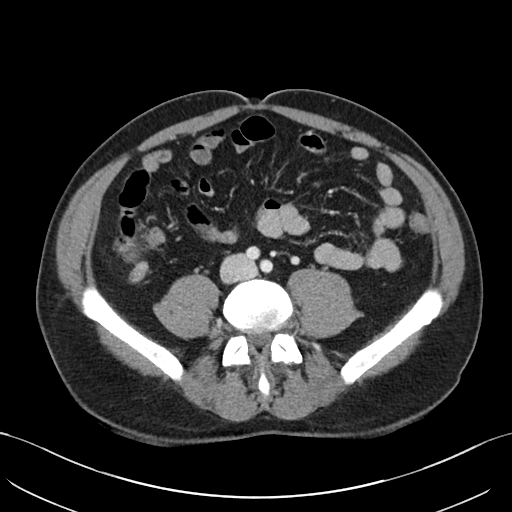
[im 54/93  soft-tissue]
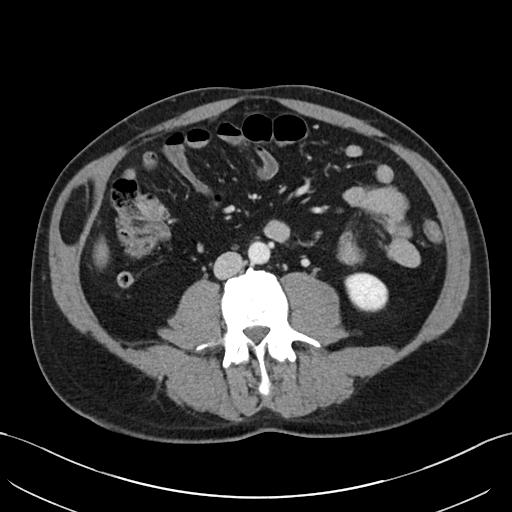
[im 59/93  soft-tissue]
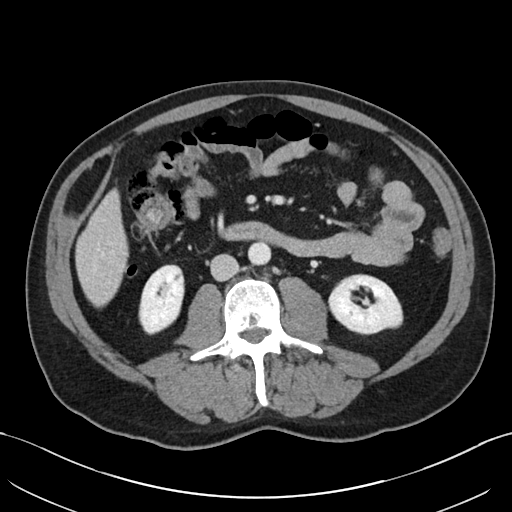
[im 59/93  bone]
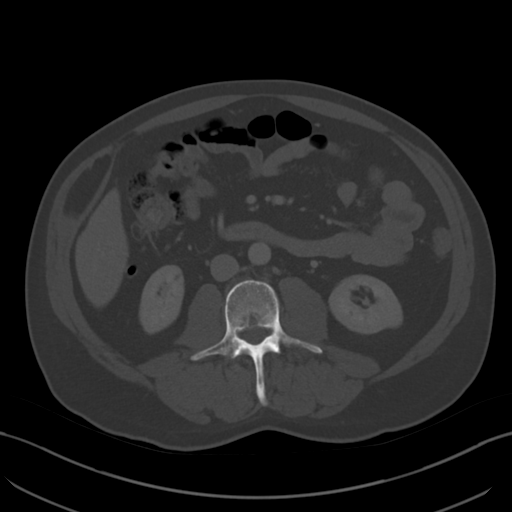
[im 68/93  soft-tissue]
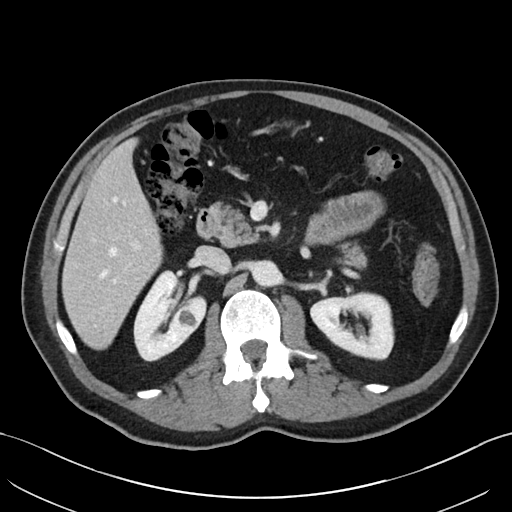
[im 73/93  soft-tissue]
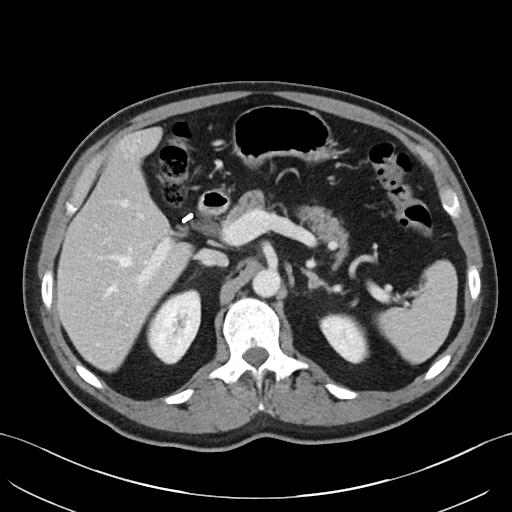
[im 78/93  soft-tissue]
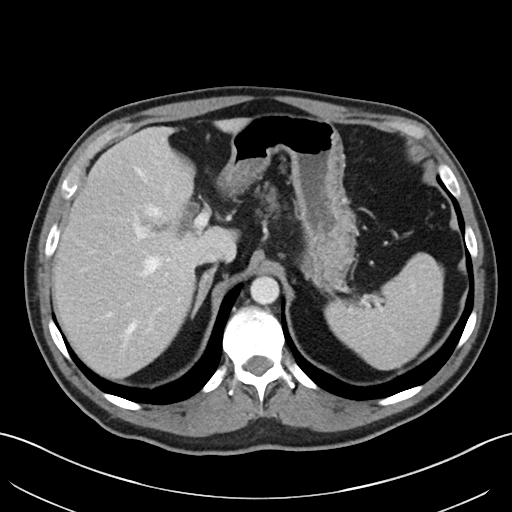
[im 88/93  soft-tissue]
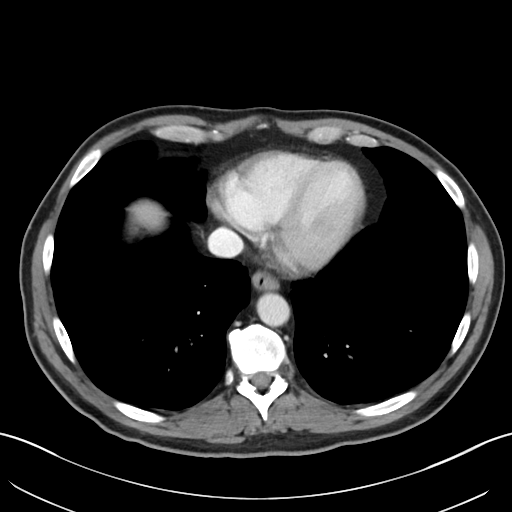

[Series 5: coronal · coronal · 0.75mm/px · 3 of 95 slices shown]
[im 32/95  soft-tissue]
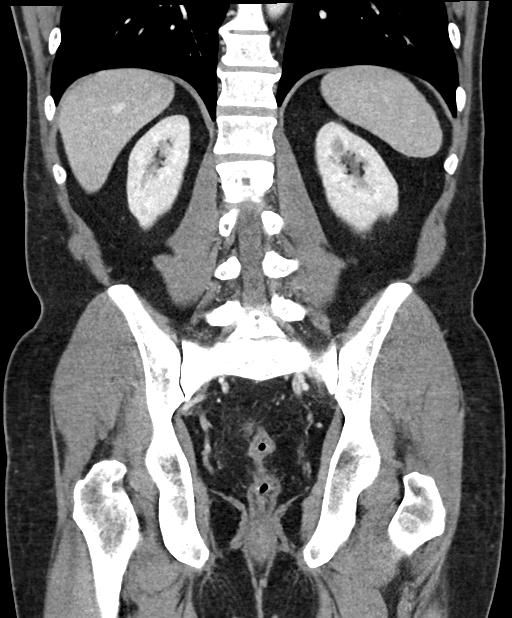
[im 42/95  soft-tissue]
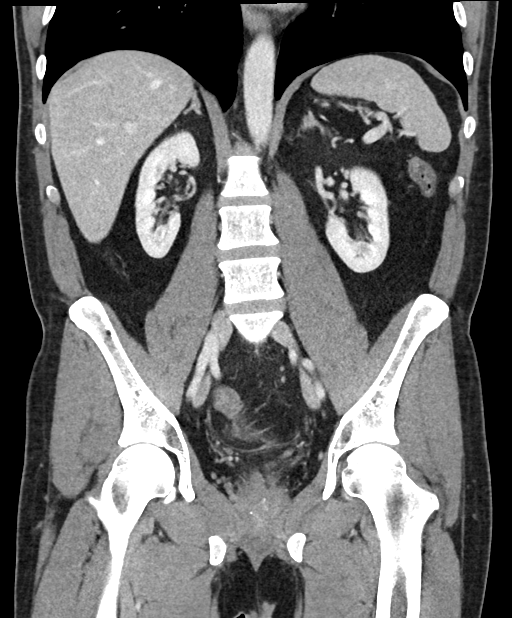
[im 53/95  soft-tissue]
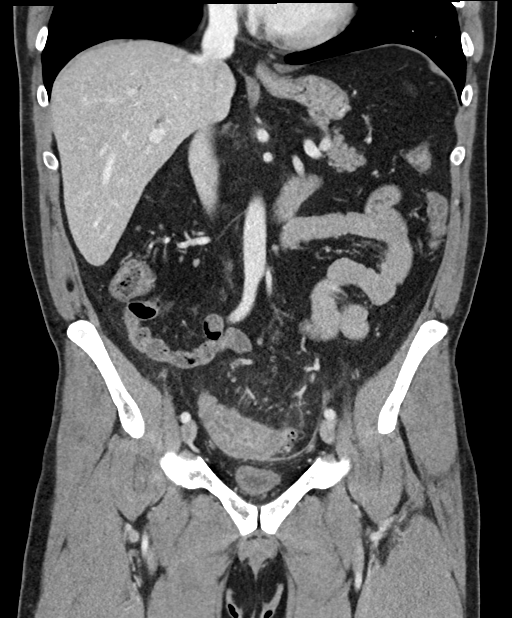

[16 of 46 positions shown; findings below may reference images not displayed]

FINDINGS: Lower chest: Lung bases clear

Hepatobiliary: Gallbladder surgically absent. Small cysts within
liver. Liver otherwise normal appearance.

Pancreas: Normal appearance

Spleen: Normal appearance

Adrenals/Urinary Tract: Adrenal glands normal. Small BILATERAL renal
cysts. Kidneys, ureters, and bladder otherwise normal appearance.

Stomach/Bowel: Normal appendix. Stomach and small bowel loops normal
appearance. Sigmoid diverticulosis with wall thickening and
pericolic inflammatory changes extending in the sigmoid mesocolon
consistent with acute diverticulitis. No evidence of abscess or
extraluminal gas. Remainder of colon normal.

Vascular/Lymphatic: Scattered pelvic phleboliths. Aorta normal
caliber. Vascular structures patent. Retroaortic LEFT renal vein. No
adenopathy.

Reproductive: Unremarkable prostate gland and seminal vesicles

Other: Small LEFT and question tiny RIGHT inguinal hernias
containing fat. No free air or free fluid. Tiny umbilical hernia
containing fat.

Musculoskeletal: Osseous structures unremarkable. Intramuscular
lipoma of the RIGHT internal oblique 7.4 x 3.3 x 2.9 cm.
IMPRESSION: Acute sigmoid diverticulitis without evidence of abscess or
extraluminal gas.

Small LEFT and question tiny RIGHT inguinal hernias containing fat.

Tiny umbilical hernia containing fat.

Intramuscular lipoma of the RIGHT internal oblique muscle 7.4 x
x 2.9 cm.

## 2021-05-03 MED ORDER — IOHEXOL 300 MG/ML  SOLN
100.0000 mL | Freq: Once | INTRAMUSCULAR | Status: AC | PRN
  Administered 2021-05-03: 100 mL via INTRAVENOUS

## 2021-05-03 MED ORDER — CIPROFLOXACIN HCL 500 MG PO TABS
500.0000 mg | ORAL_TABLET | Freq: Two times a day (BID) | ORAL | 0 refills | Status: DC
Start: 2021-05-03 — End: 2021-05-08

## 2021-05-03 MED ORDER — METRONIDAZOLE 500 MG PO TABS: 500.0000 mg | ORAL_TABLET | Freq: Three times a day (TID) | ORAL | 0 refills | Status: DC

## 2021-05-03 NOTE — ED Notes (Signed)
Patient verbalizes understanding of discharge instructions. Opportunity for questioning and answers were provided. Patient discharged from ED.  °

## 2021-05-03 NOTE — ED Notes (Signed)
Pt currently denis pain. Pt reports intermittent abdominal pain that seems to get worse with gas and or exertion

## 2021-05-03 NOTE — Discharge Instructions (Addendum)
He can appointment to follow-up with gastroenterology regarding the abnormal liver function test.  Today's work-up shows evidence of diverticulitis.  Take the antibiotics as directed.  Prescriptions provided.  Would expect improvement on the antibiotics over the next couple days.  Return for any new or worse symptoms.  Return for significant fever or persistent vomiting making and unable to keep the antibiotics down.

## 2021-05-03 NOTE — ED Triage Notes (Signed)
Low mid abd pain that started last night. C/o of loose stools and gas. Denies vomiting.

## 2021-05-03 NOTE — ED Provider Notes (Signed)
[place] EMERGENCY DEPT Provider Note   CSN: [id number] Arrival date & time: 05/03/21  0762     History No chief complaint on file.   Raymond Kelly is a 50 y.o. male.  Patient with the onset of sore periumbilical lower abdomen pain that started last night.  Patient's been having some difficulties with some mucousy type stools.  And a lot of gas.  But he in the last 6 months did have a colonoscopy done he believes that was done by [facility] gastroenterology.  Looks as if he may have had an upper endoscopy as well.  Patient had a gallbladder removed in the past.  Had a bile leak and required a stent.  So patient's had pain like this before.  This was just a little more severe.  No nausea or vomiting.  Patient is also recovering from an upper respiratory infection.  Patient states that he had COVID about a year ago.  And has had elevated liver function test since that time.  But but has not had them rechecked recently.  Far as he knows he never went back to normal.  Chart review shows that the colonoscopy was in December.  It was by [facility] gastroenterology.  Patient had upper endoscopy done in 2017.  Patient denies any alcohol intake.      Past Medical History:  Diagnosis Date   Family history of adverse reaction to anesthesia    mother slow to awaken   History of kidney stones 2007    Patient Active Problem List   Diagnosis Date Noted   Hyperbilirubinemia 02/04/2016   Bile leak, postoperative     Past Surgical History:  Procedure Laterality Date   CHOLECYSTECTOMY N/A 02/01/2016   Procedure: LAPAROSCOPIC CHOLECYSTECTOMY WITH INTRAOPERATIVE CHOLANGIOGRAM;  Surgeon: Coralie Keens, MD;  Location: [facility];  Service: General;  Laterality: N/A;   cyst removed     between his front teeth and cyst removed from abdomal area   ESOPHAGOGASTRODUODENOSCOPY (EGD) WITH PROPOFOL N/A 03/06/2016   Procedure: ESOPHAGOGASTRODUODENOSCOPY (EGD) WITH PROPOFOL;  Surgeon: Doran Stabler, MD;   Location: [facility];  Service: Gastroenterology;  Laterality: N/A;  with stent removal.   GASTROINTESTINAL STENT REMOVAL N/A 03/06/2016   Procedure: GASTROINTESTINAL STENT REMOVAL;  Surgeon: Doran Stabler, MD;  Location: [facility];  Service: Gastroenterology;  Laterality: N/A;   TONSILLECTOMY     ULNAR NERVE TRANSPOSITION         Family History  Problem Relation Age of Onset   Ulcerative colitis Mother    Irritable bowel syndrome Mother    Hypertension Mother    Arthritis Father    ALS Father    Neuropathy Father    Hypertension Father    Other Father        "open stomach"   Diabetes Brother    Juvenile Diabetes Niece    Colon cancer Neg Hx    Rectal cancer Neg Hx    Stomach cancer Neg Hx    Esophageal cancer Neg Hx     Social History   Tobacco Use   Smoking status: Never   Smokeless tobacco: Never  Vaping Use   Vaping Use: Never used  Substance Use Topics   Alcohol use: Not Currently    Comment: social at times   Drug use: No    Home Medications Prior to Admission medications   Medication Sig Start Date End Date Taking? Authorizing Provider  simethicone (MYLICON) 80 MG chewable tablet Chew 80-160 mg by  mouth every 6 (six) hours as needed for flatulence.  09/07/19  [provider]    Allergies    No known allergies  Review of Systems   Review of Systems  Constitutional:  Negative for chills and fever.  HENT:  Negative for ear pain and sore throat.   Eyes:  Negative for pain and visual disturbance.  Respiratory:  Negative for cough and shortness of breath.   Cardiovascular:  Negative for chest pain and palpitations.  Gastrointestinal:  Positive for abdominal pain. Negative for vomiting.  Genitourinary:  Negative for dysuria and hematuria.  Musculoskeletal:  Negative for arthralgias and back pain.  Skin:  Negative for color change and rash.  Neurological:  Negative for seizures and syncope.  All other systems reviewed and are  negative.  Physical Exam Updated Vital Signs BP (!) 137/98 (BP Location: Left Arm)   Pulse 71   Temp 98.8 F (37.1 C) (Oral)   Resp 18   Ht 1.727 m (5' 8" )   Wt 88.9 kg   SpO2 98%   BMI 29.80 kg/m   Physical Exam Vitals and nursing note reviewed.  Constitutional:      Appearance: Normal appearance. He is well-developed.  HENT:     Head: Normocephalic and atraumatic.  Eyes:     Conjunctiva/sclera: Conjunctivae normal.     Pupils: Pupils are equal, round, and reactive to light.  Cardiovascular:     Rate and Rhythm: Normal rate and regular rhythm.     Heart sounds: No murmur heard. Pulmonary:     Effort: Pulmonary effort is normal. No respiratory distress.     Breath sounds: Normal breath sounds.  Abdominal:     Palpations: Abdomen is soft.     Tenderness: There is no abdominal tenderness. There is no guarding.  Musculoskeletal:     Cervical back: Normal range of motion and neck supple.  Skin:    General: Skin is warm and dry.  Neurological:     General: No focal deficit present.     Mental Status: He is alert and oriented to person, place, and time.    ED Results / Procedures / Treatments   Labs (all labs ordered are listed, but only abnormal results are displayed) Labs Reviewed  COMPREHENSIVE METABOLIC PANEL - Abnormal; Notable for the following components:      Result Value   Glucose, Bld 120 (*)    AST 78 (*)    ALT 160 (*)    Alkaline Phosphatase 134 (*)    Total Bilirubin 1.6 (*)    All other components within normal limits  CBC - Abnormal; Notable for the following components:   WBC 12.1 (*)    All other components within normal limits  LIPASE, BLOOD  URINALYSIS, ROUTINE W REFLEX MICROSCOPIC    EKG None  Radiology No results found.  Procedures Procedures   Medications Ordered in ED Medications - No data to display  ED Course  I have reviewed the triage vital signs and the nursing notes.  Pertinent labs & imaging results that were  available during my care of the patient were reviewed by me and considered in my medical decision making (see chart for details).    MDM Rules/Calculators/A&P                           Patient with markedly elevated liver function test.  He states that that is been present for probably over a year.  Has been seen by [facility] gastroenterology in the past had a colonoscopy done by them without any acute findings in December.  Has not been back to follow-up with them.  Denies any alcohol intake.  Labs are significant for total bili 1.6 alk phos of 134.  Patient has had his gallbladder removed and did have a stent for biliary leak.  Possibly could have some biliary narrowing.  AST 78 ALT 160.  Lipase is normal.  White count slightly elevated at 12.1.  Hemoglobin normal.  Will get CT scan abdomen.  If no acute findings will refer patient back to [facility] gastroenterology for further follow-up.   Final Clinical Impression(s) / ED Diagnoses Final diagnoses:  None    Rx / DC Orders ED Discharge Orders     None        Fredia Sorrow, MD 05/03/21 1358

## 2021-05-07 ENCOUNTER — Telehealth: Payer: Self-pay | Admitting: Gastroenterology

## 2021-05-07 DIAGNOSIS — R7989 Other specified abnormal findings of blood chemistry: Secondary | ICD-10-CM

## 2021-05-07 NOTE — Telephone Encounter (Signed)
Patient called.  Went to ER recently, had a CT scan which showed diverticulitis.  Was told to be on clear liquids.  How long should he stay on those?  Also, he had elevated liver enzymes and is concerned about those.  Wants to know if he needs an appointment.  Please can you call and advise?  Thank you.

## 2021-05-07 NOTE — Telephone Encounter (Signed)
Discussed with pt that he could follow a low fiber/low residue diet and gradually add things back in as tolerated. Pt scheduled to see Dr. Loletha Carrow 05/14/21 at 8:20am. Pt aware of appt.

## 2021-05-08 MED ORDER — CIPROFLOXACIN HCL 500 MG PO TABS: 500.0000 mg | ORAL_TABLET | Freq: Two times a day (BID) | ORAL | 0 refills | Status: AC

## 2021-05-08 MED ORDER — METRONIDAZOLE 500 MG PO TABS: 500.0000 mg | ORAL_TABLET | Freq: Three times a day (TID) | ORAL | 0 refills | Status: AC

## 2021-05-08 NOTE — Telephone Encounter (Signed)
Patient called is highly concerned about taking the antibiotics with having really high LFT's is asking for a call back to discus further with a nurse.

## 2021-05-08 NOTE — Telephone Encounter (Signed)
Spoke with patient in regards to recommendations as outlined below. Pt reports that the medications are making him nauseated, advised patient to try advancing to a bland diet to help coat his stomach before taking medications. Pt would like additional prescriptions sent to [facility] in [place]. Pt is aware that he will need to complete the full course of antibiotics. Pt is aware that he needs to come in about 30 minutes before his appt for blood work. Pt is aware that the lab is in the basement and his appt will be on the 3rd floor. Pt verbalized understanding of all information and had no concerns at the end of the call.   Lab order and reminder in epic.  Prescriptions sent to pharmacy on file.

## 2021-05-08 NOTE — Addendum Note (Signed)
Addended by: Yevette Edwards on: 05/08/2021 01:18 PM   Modules accepted: Orders

## 2021-05-08 NOTE — Telephone Encounter (Signed)
I am aware of his history and do not yet know why his liver labs are elevated, but it will be OK to take the ciprofloxacin and metronidazole for diverticulitis.  In fact, I think he needs a total 10 day course, so please send Rx for additional 3 days of both antibiotics with same dosing instructions.  Will assess him in clinic next week and then decide what add'l testing needed for the LFTs.  Address insomnia with PCP.  - HD

## 2021-05-08 NOTE — Telephone Encounter (Signed)
Additional recommendations per Dr. Loletha Carrow - have patient come in 30 minutes prior to his appt to get STAT hepatic function panel.

## 2021-05-08 NOTE — Telephone Encounter (Signed)
Returned call to patient, he states that he had a diverticulitis flare on Friday. Pt states that he was prescribed Cipro and Flagyl for 7 days, pt reports that he has about 2 days left. Pt reports that he is concerned about finishing the medication because his LFT's are elevated. Pt denies any NSAID or alcohol use. Pt reports that he previously had his gallbladder removed and had to have a stent placed because he had a leak. Pt is very concerned about the elevated LFT's. He also reports that he has been having insomnia and no relief with melatonin. Labs and CT scan are available in epic for review. Pt has a follow up scheduled with you on 05/14/21. Please advise, thanks.

## 2021-05-13 ENCOUNTER — Telehealth: Payer: Self-pay

## 2021-05-13 ENCOUNTER — Other Ambulatory Visit: Payer: Self-pay

## 2021-05-13 NOTE — Telephone Encounter (Signed)
-----   Message from Yevette Edwards, RN sent at 05/08/2021  1:14 PM EDT ----- Regarding: labs LFT's tomorrow morning before appt - call in the afternoon to remind patient

## 2021-05-13 NOTE — Telephone Encounter (Signed)
Spoke with patient to remind him to come in for labs tomorrow morning before his appt. Pt will come in for labs around 7:45 am. I have provided patient with the office address. Pt verbalized understanding and had no concerns at the end of the call.

## 2021-05-14 ENCOUNTER — Ambulatory Visit (INDEPENDENT_AMBULATORY_CARE_PROVIDER_SITE_OTHER): Payer: 59 | Admitting: Gastroenterology

## 2021-05-14 ENCOUNTER — Other Ambulatory Visit: Payer: 59

## 2021-05-14 ENCOUNTER — Encounter: Payer: Self-pay | Admitting: Gastroenterology

## 2021-05-14 VITALS — BP 120/64 | HR 74 | Ht 68.0 in | Wt 186.6 lb

## 2021-05-14 DIAGNOSIS — R7989 Other specified abnormal findings of blood chemistry: Secondary | ICD-10-CM | POA: Diagnosis not present

## 2021-05-14 DIAGNOSIS — K5732 Diverticulitis of large intestine without perforation or abscess without bleeding: Secondary | ICD-10-CM

## 2021-05-14 DIAGNOSIS — R1013 Epigastric pain: Secondary | ICD-10-CM

## 2021-05-14 LAB — HEPATIC FUNCTION PANEL
ALT: 43 U/L (ref 0–53)
AST: 26 U/L (ref 0–37)
Albumin: 4.3 g/dL (ref 3.5–5.2)
Alkaline Phosphatase: 81 U/L (ref 39–117)
Bilirubin, Direct: 0.2 mg/dL (ref 0.0–0.3)
Total Bilirubin: 0.9 mg/dL (ref 0.2–1.2)
Total Protein: 6.8 g/dL (ref 6.0–8.3)

## 2021-05-14 NOTE — Progress Notes (Signed)
[place] [facility] Progress Note  Chief Complaint: Diverticulitis and elevated LFTs  Subjective  History: Raymond Kelly is here for follow-up after recent ED visit.  I last saw him in December 2021 for a colonoscopy when he was having some pelvic pain (which may have been urologic) had an episode of bloody mucus.  Colonoscopy was normal. He had escalating lower abdominal pain for few days and came to the ED on 05/03/2021.  CT scan showed sigmoid diverticulitis treated with 7 days of ciprofloxacin and metronidazole.  He was also noted to have elevated LFTs which had been the case in the past (see below).  He contacted our office shortly after, I recommended an additional 3 days for total 7-day course of antibiotics, and he went for hepatic function panel earlier this morning and has not yet resulted. He was taking Gemtesa for frequent urination, but stopped it because he thought it might be related to these recent issues. Raymond Kelly had a bile leak in 2017 treated with an ERCP with plastic biliary stent, stent removed by EGD shortly afterward. _______________________  Raymond Kelly reports that he is feeling much better, with minimal lower abdominal pain at this point.  He still has urinary frequency and does not understand why that is been occurring.  He has been gassy last few months and wondered if that may have been smoldering diverticulitis.  Denies rectal bleeding.  Also, on direct questioning, he reported 2 episodes in the last couple of months of a "sharp gas pain" in the epigastrium that lasted an hour or 2.  ROS: Cardiovascular:  no chest pain Respiratory: no dyspnea Appetite generally good and weight stable. Work-related stress Interrupted sleep from urinary frequency overnight Remainder of systems negative except as above  The patient's Past Medical, Family and Social History were reviewed and are on file in the EMR.  Objective:  Med list reviewed No current outpatient medications on  file.   Vital signs in last 24 hrs: Vitals:   05/14/21 0815  BP: 120/64  Pulse: 74  SpO2: 98%   Wt Readings from Last 3 Encounters:  05/14/21 186 lb 9.6 oz (84.6 kg)  05/03/21 196 lb (88.9 kg)  06/19/20 200 lb (90.7 kg)    Physical Exam  Well-appearing HEENT: sclera anicteric, oral mucosa moist without lesions Neck: supple, no thyromegaly, JVD or lymphadenopathy Cardiac: RRR without murmurs, S1S2 heard, no peripheral edema Pulm: clear to auscultation bilaterally, normal RR and effort noted Abdomen: soft, no tenderness, with active bowel sounds. No guarding or palpable hepatosplenomegaly. Skin; warm and dry, no jaundice or rash  Labs:  CMP Latest Ref Rng & Units 05/03/2021 09/13/2019 02/05/2016  Glucose 70 - 99 mg/dL 120(H) 102(H) 108(H)  BUN 6 - 20 mg/dL 13 14 11   Creatinine 0.61 - 1.24 mg/dL 1.11 1.03 1.02  Sodium 135 - 145 mmol/L 140 138 140  Potassium 3.5 - 5.1 mmol/L 3.7 4.0 4.4  Chloride 98 - 111 mmol/L 104 106 105  CO2 22 - 32 mmol/L 28 25 29   Calcium 8.9 - 10.3 mg/dL 9.3 8.8(L) 8.6(L)  Total Protein 6.5 - 8.1 g/dL 6.7 6.9 6.5  Total Bilirubin 0.3 - 1.2 mg/dL 1.6(H) 1.2 2.8(H)  Alkaline Phos 38 - 126 U/L 134(H) 144(H) 123  AST 15 - 41 U/L 78(H) 59(H) 108(H)  ALT 0 - 44 U/L 160(H) 118(H) 175(H)   CBC Latest Ref Rng & Units 05/03/2021 09/13/2019 02/05/2016  WBC 4.0 - 10.5 K/uL 12.1(H) 5.9 8.3  Hemoglobin 13.0 - 17.0 g/dL  15.6 15.6 14.3  Hematocrit 39.0 - 52.0 % 45.7 45.5 42.1  Platelets 150 - 400 K/uL 244 335 236    2017 LFTs during bile leak  2021 LFTs during admission for respiratory illness and altered mental status 2 weeks after COVID infection  ___________________________________________ Radiologic studies:  CLINICAL DATA:  Acute abdominal pain,   EXAM: CT ABDOMEN AND PELVIS WITH CONTRAST   TECHNIQUE: Multidetector CT imaging of the abdomen and pelvis was performed using the standard protocol following bolus administration of intravenous contrast.  Sagittal and coronal MPR images reconstructed from axial data set.   CONTRAST:  187mL OMNIPAQUE IOHEXOL 300 MG/ML SOLN IV. No oral contrast.   COMPARISON:  02/04/2016   FINDINGS: Lower chest: Lung bases clear   Hepatobiliary: Gallbladder surgically absent. Small cysts within liver. Liver otherwise normal appearance.   Pancreas: Normal appearance   Spleen: Normal appearance   Adrenals/Urinary Tract: Adrenal glands normal. Small BILATERAL renal cysts. Kidneys, ureters, and bladder otherwise normal appearance.   Stomach/Bowel: Normal appendix. Stomach and small bowel loops normal appearance. Sigmoid diverticulosis with wall thickening and pericolic inflammatory changes extending in the sigmoid mesocolon consistent with acute diverticulitis. No evidence of abscess or extraluminal gas. Remainder of colon normal.   Vascular/Lymphatic: Scattered pelvic phleboliths. Aorta normal caliber. Vascular structures patent. Retroaortic LEFT renal vein. No adenopathy.   Reproductive: Unremarkable prostate gland and seminal vesicles   Other: Small LEFT and question tiny RIGHT inguinal hernias containing fat. No free air or free fluid. Tiny umbilical hernia containing fat.   Musculoskeletal: Osseous structures unremarkable. 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   IMPRESSION: Acute sigmoid diverticulitis without evidence of abscess or extraluminal gas.   Small LEFT and question tiny RIGHT inguinal hernias containing fat.   Tiny umbilical hernia containing fat.   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     Electronically Signed   By: Lavonia Dana M.D.   On: 05/03/2021 14:51  ____________________________________________ Other:   _____________________________________________ Assessment & Plan  Assessment: Encounter Diagnoses  Name Primary?   Diverticulitis of colon Yes   Epigastric pain    LFTs abnormal     First episode of uncomplicated left-sided diverticulitis, now much improved after 10-day course of ciprofloxacin and metronidazole.  2 episodes epigastric pain last couple of months, unclear relation to overall clinical picture.  In setting of elevated LFTs, raises concern for CBD stone.  Hepatic function panel was drawn prior to today's appointment, but not yet resulted. Chronic viral hepatitis, autoimmune or metabolic liver disease to be considered.  Plan: Follow-up hepatic function panel.  Depending on results, additional lab work-up may be needed.  MRCP to rule out CBD stone.  He was agreeable.  No dietary restrictions at this point, he appears to have recovered from the diverticulitis.   32 minutes were spent on this encounter (including chart review, history/exam, counseling/coordination of care, and documentation) > 50% of that time was spent on counseling and coordination of care.   [name]

## 2021-05-14 NOTE — Patient Instructions (Signed)
If you are age 52 or older, your body mass index should be between 23-30. Your Body mass index is 28.37 kg/m. If this is out of the aforementioned range listed, please consider follow up with your Primary Care Provider.  If you are age 75 or younger, your body mass index should be between 19-25. Your Body mass index is 28.37 kg/m. If this is out of the aformentioned range listed, please consider follow up with your Primary Care Provider.   ________________________________________________________  The [facility] providers would like to encourage you to use [facility] to communicate with providers for non-urgent requests or questions.  Due to long hold times on the telephone, sending your provider a message by [facility] may be a faster and more efficient way to get a response.  Please allow 48 business hours for a response.  Please remember that this is for non-urgent requests.  _______________________________________________________  Dennis Bast have been scheduled for an MRI at [facility] long radiology on 05-22-2021. Your appointment time is 7:30am. Please arrive to admitting (at main entrance of the hospital) 15 minutes prior to your appointment time for registration purposes. Please make certain not to have anything to eat or drink 4 hours prior to your test. In addition, if you have any metal in your body, have a pacemaker or defibrillator, please be sure to let your ordering physician know. This test typically takes 45 minutes to 1 hour to complete. Should you need to reschedule, please call [phone] to do so.  Due to recent changes in healthcare laws, you may see the results of your imaging and laboratory studies on MyChart before your provider has had a chance to review them.  We understand that in some cases there may be results that are confusing or concerning to you. Not all laboratory results come back in the same time frame and the provider may be waiting for multiple results in order to interpret  others.  Please give [place] 48 hours in order for your provider to thoroughly review all the results before contacting the office for clarification of your results.   It was a pleasure to see you today!  Thank you for trusting me with your gastrointestinal care!

## 2021-05-15 ENCOUNTER — Telehealth: Payer: Self-pay | Admitting: Gastroenterology

## 2021-05-15 NOTE — Telephone Encounter (Signed)
Called and spoke with patient in regards to recommendations. Advised pt that we will cancel the MRCP. Pt verbalized understanding and had no concerns at the end of the call.

## 2021-05-15 NOTE — Telephone Encounter (Signed)
Thank you for the note, and I am glad he called.  I have just now had a chance to look at the liver lab results, and they have returned completely to normal.  That speaks against him having a stone retained in the common bile duct, so I think the MRCP is not necessary at this point.  If he has recurrent episodes of epigastric or right upper quadrant pain, then I would like him to contact us and we will get repeat LFTs at that time.  - HD

## 2021-05-15 NOTE — Telephone Encounter (Signed)
Inbound call from patient requesting a call back to discuss if the MRCP is necessary based off lab results. Best contact number [phone]

## 2021-05-17 ENCOUNTER — Telehealth: Payer: Self-pay | Admitting: Gastroenterology

## 2021-05-17 NOTE — Telephone Encounter (Signed)
Inbound call from pt requesting a call back stating that he is having a flare up from his diverticulitis and would like a prescription called in. Please advise. Thank you.

## 2021-05-17 NOTE — Telephone Encounter (Signed)
Dr. Loletha Carrow has left. Please advise as doc of day

## 2021-05-17 NOTE — Telephone Encounter (Signed)
Pt called states he thinks he may be having another diverticulitis flare. Reports last night he started having lots of gas but he reports no pain yet. No fever. States he did pass some mucous in the stool. He is not sure if this it diverticulitis or not. Afraid with weekend coming. Please advise.

## 2021-05-20 NOTE — Telephone Encounter (Signed)
Spoke with pt and he is aware of Dr. Danis' recommendations. 

## 2021-05-20 NOTE — Telephone Encounter (Signed)
Spoke with pt and he states things have settled down, he reports he has a "Weird sensation on his left side that is like  a dull ache that comes and goes. He wonders if he needs to have an antibiotic "on hand" in case he has a flare so he would have something to take. Please advise.

## 2021-05-20 NOTE — Telephone Encounter (Signed)
As he and I had discussed in the office, the inflammation of that area take some time to resolve even after the infection component is cleared with the antibiotics.  Rather than "on hand" antibiotics, I would prefer to hear from him if he is having pain such as what brought him to the emergency department recently.  - HD

## 2021-05-20 NOTE — Telephone Encounter (Signed)
Please call him to see how he is feeling today.  Gas and mucus without pain is more likely after effect of antibiotic than persistent diverticulitis after he rec'd a 10 day course of treatment.  - HD

## 2021-05-22 ENCOUNTER — Ambulatory Visit (HOSPITAL_COMMUNITY): Payer: 59

## 2021-05-22 NOTE — Telephone Encounter (Signed)
Called and spoke with patient in regards to information below. Pt will begin Prednisone as prescribed. Pt verbalized understanding and had no concerns at the end of the call.

## 2021-05-22 NOTE — Telephone Encounter (Signed)
Please reassure him that I do not expect the prednisone to cause any trouble related to the recent diverticulitis.  - HD

## 2021-05-22 NOTE — Telephone Encounter (Signed)
Patient called and is concerned about being able to take his antibiotic medication because he said he is having a Diverticulitis flare up.

## 2021-05-22 NOTE — Telephone Encounter (Addendum)
Returned call to patient. Patient states that he injured himself and pulled a muscle. Patient reports that he saw Dr. Nuala Alpha today and prescribed him Baclofen 5 mg tablets to take a bedtime along with a 6-day Prednisone taper. Patient wanted to know if it was OK to begin Prednisone taper. He states that he thought the Prednisone was contraindicated since he is having a bout of diverticulitis. Please advise, thanks.

## 2021-06-02 ENCOUNTER — Emergency Department (HOSPITAL_BASED_OUTPATIENT_CLINIC_OR_DEPARTMENT_OTHER): Payer: 59

## 2021-06-02 ENCOUNTER — Emergency Department (HOSPITAL_BASED_OUTPATIENT_CLINIC_OR_DEPARTMENT_OTHER): Payer: 59 | Admitting: Radiology

## 2021-06-02 ENCOUNTER — Encounter (HOSPITAL_BASED_OUTPATIENT_CLINIC_OR_DEPARTMENT_OTHER): Payer: Self-pay

## 2021-06-02 ENCOUNTER — Emergency Department (HOSPITAL_BASED_OUTPATIENT_CLINIC_OR_DEPARTMENT_OTHER)
Admission: EM | Admit: 2021-06-02 | Discharge: 2021-06-02 | Disposition: A | Discharge status: Home / Self Care | Payer: 59 | Attending: Emergency Medicine | Admitting: Emergency Medicine

## 2021-06-02 DIAGNOSIS — R531 Weakness: Secondary | ICD-10-CM | POA: Diagnosis present

## 2021-06-02 DIAGNOSIS — R0789 Other chest pain: Secondary | ICD-10-CM | POA: Insufficient documentation

## 2021-06-02 DIAGNOSIS — M549 Dorsalgia, unspecified: Secondary | ICD-10-CM | POA: Insufficient documentation

## 2021-06-02 DIAGNOSIS — M5412 Radiculopathy, cervical region: Secondary | ICD-10-CM | POA: Insufficient documentation

## 2021-06-02 HISTORY — DX: Diverticulitis of intestine, part unspecified, without perforation or abscess without bleeding: K57.92

## 2021-06-02 LAB — BASIC METABOLIC PANEL
Anion gap: 7 (ref 5–15)
BUN: 15 mg/dL (ref 6–20)
CO2: 26 mmol/L (ref 22–32)
Calcium: 9 mg/dL (ref 8.9–10.3)
Chloride: 105 mmol/L (ref 98–111)
Creatinine, Ser: 0.88 mg/dL (ref 0.61–1.24)
GFR, Estimated: >60 mL/min (ref >60)
Glucose, Bld: 122 mg/dL — ABNORMAL HIGH (ref 70–99)
Potassium: 4.1 mmol/L (ref 3.5–5.1)
Sodium: 138 mmol/L (ref 135–145)

## 2021-06-02 LAB — TROPONIN I (HIGH SENSITIVITY): Troponin I (High Sensitivity): 4 ng/L (ref <18)

## 2021-06-02 LAB — CBC
HCT: 45.2 % (ref 39.0–52.0)
Hemoglobin: 15.5 g/dL (ref 13.0–17.0)
MCH: 30.9 pg (ref 26.0–34.0)
MCHC: 34.3 g/dL (ref 30.0–36.0)
MCV: 90 fL (ref 80.0–100.0)
Platelets: 229 10*3/uL (ref 150–400)
RBC: 5.02 MIL/uL (ref 4.22–5.81)
RDW: 12 % (ref 11.5–15.5)
WBC: 7.4 10*3/uL (ref 4.0–10.5)
nRBC: 0 % (ref 0.0–0.2)

## 2021-06-02 IMAGING — CT CT HEAD W/O CM
4 series · 17 of 47 positions shown, 19 images · non-contrast
Comparison: [DATE]

CLINICAL DATA: Pt presents with Left posterior shoulder blade pain
x2 weeks, started radiating around to his chest and down into his
Left arm. Associated with numbness and tingling to his Left arm x2
days. Recently dx w/diverticulitis. Spouse reports she has noticed
pt having "sleep apnea" 3 days ago, feeling foggy and woke last
night diaphoretic. Secretary called spouse and reported he was
staring out in to space, not answering emails, not his usual self.
Spouse is concerned for stroke or heart attack. Pt's father and
daughter both have a hx of a stroke

EXAM:
CT HEAD WITHOUT CONTRAST
TECHNIQUE: Contiguous axial images were obtained from the base of the skull
through the vertex without intravenous contrast.

[Series 2: head wo · axial · 0.47mm/px · z∈[-160,-40]mm · 7 of 32 slices shown, 9 images]
[im 4/32  brain]
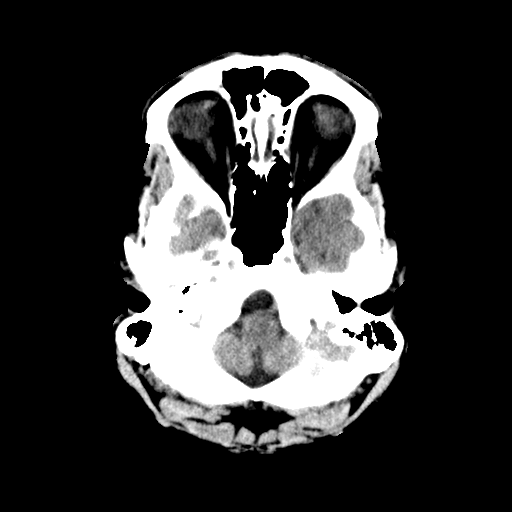
[im 4/32  bone]
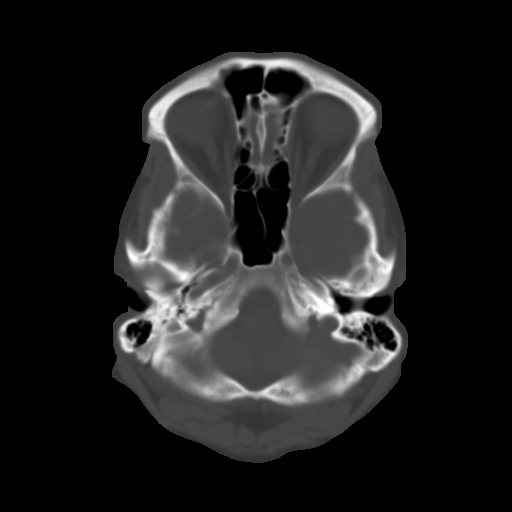
[im 8/32  brain]
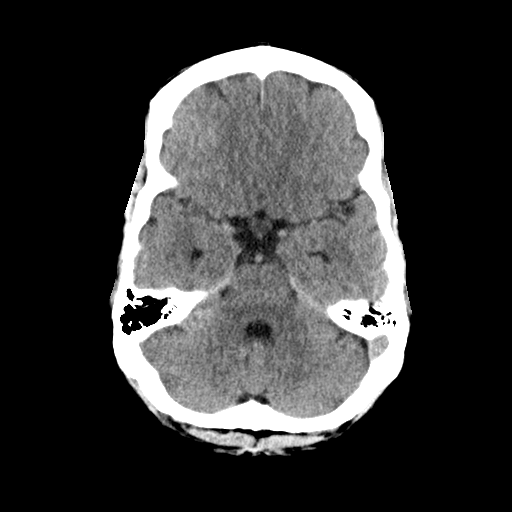
[im 12/32  brain]
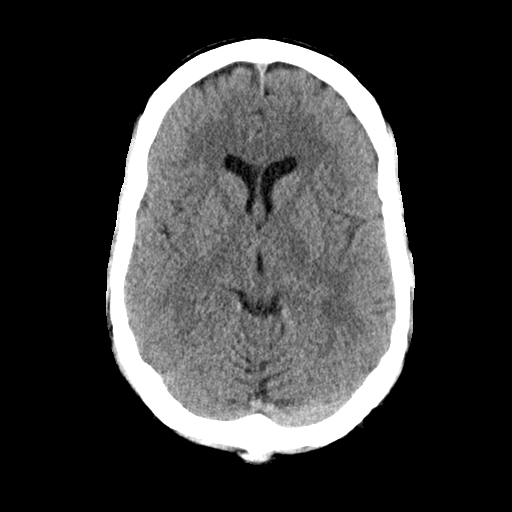
[im 16/32  brain]
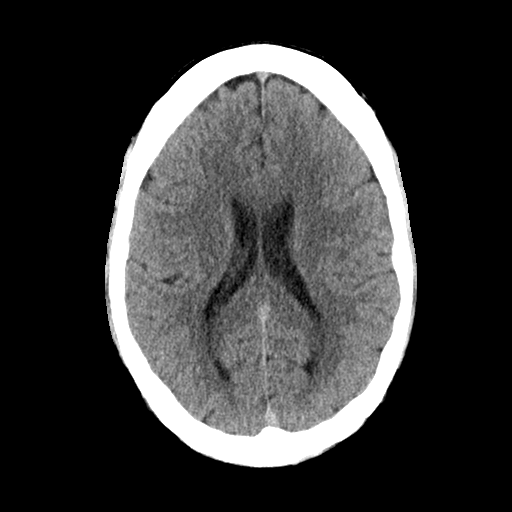
[im 20/32  brain]
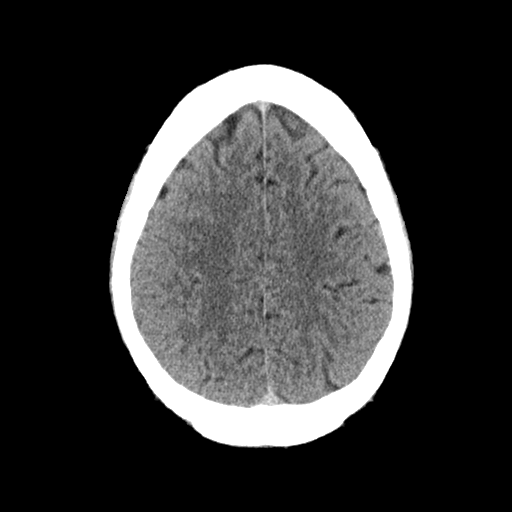
[im 20/32  bone]
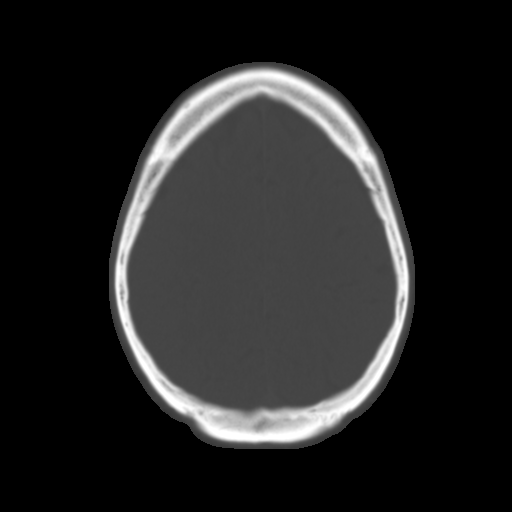
[im 24/32  brain]
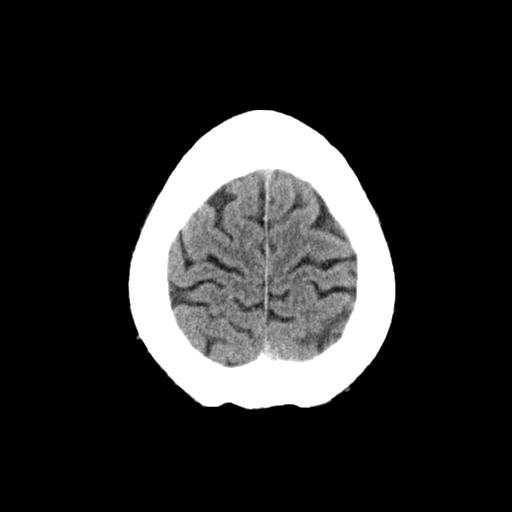
[im 28/32  brain]
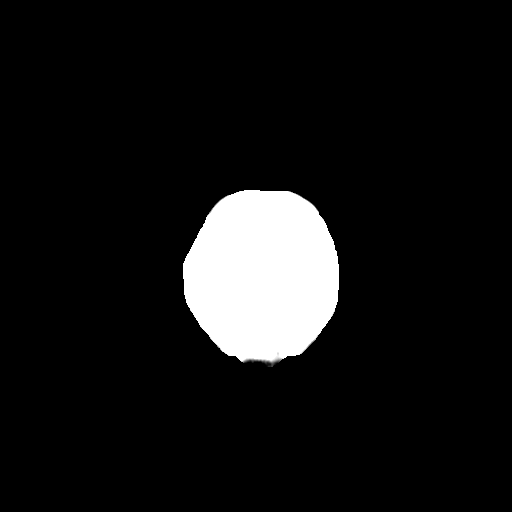

[Series 3: head bone · axial · 0.47mm/px · z∈[-161,-105]mm · 4 of 79 slices shown]
[im 8/79  bone]
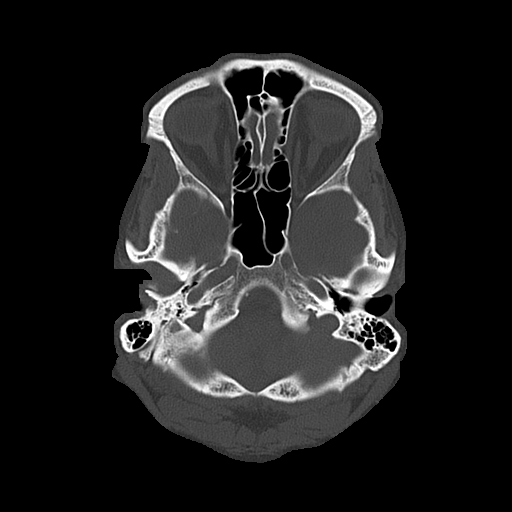
[im 16/79  bone]
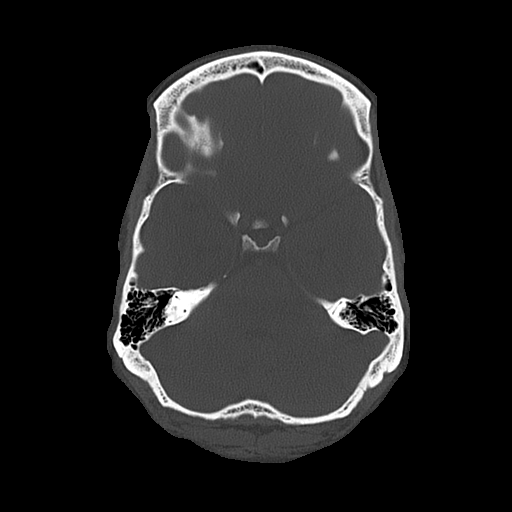
[im 24/79  bone]
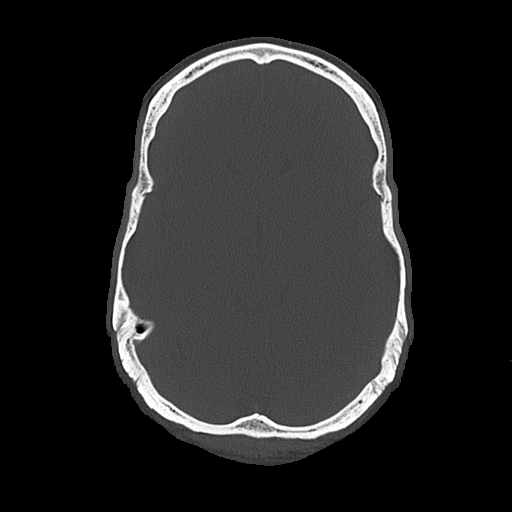
[im 36/79  bone]
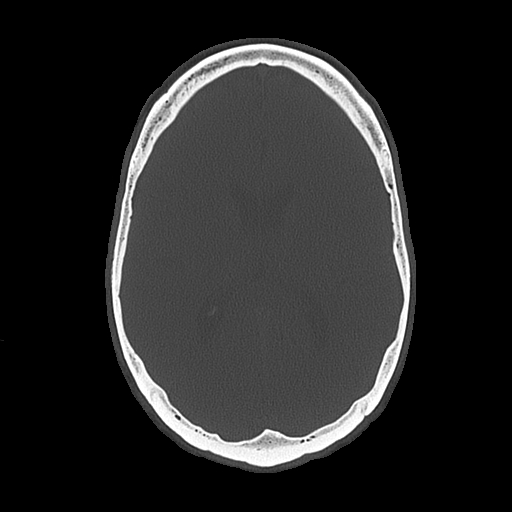

[Series 4: coronal soft · coronal · 0.34mm/px · 3 of 70 slices shown]
[im 24/70  brain]
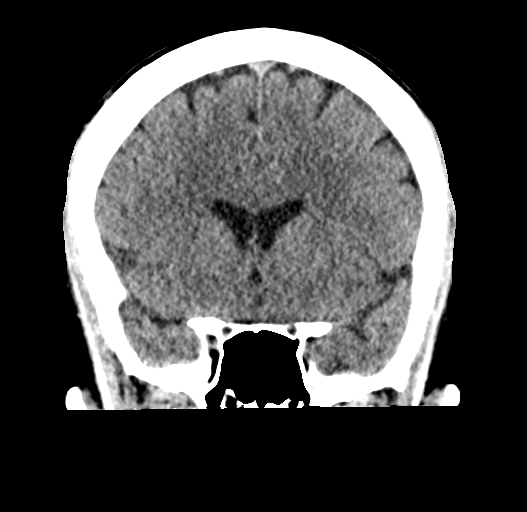
[im 31/70  brain]
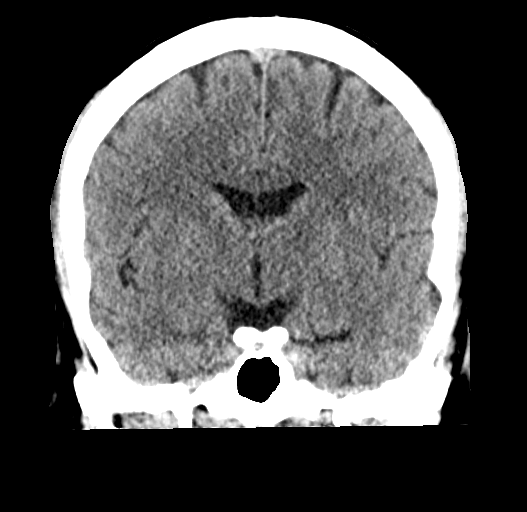
[im 39/70  brain]
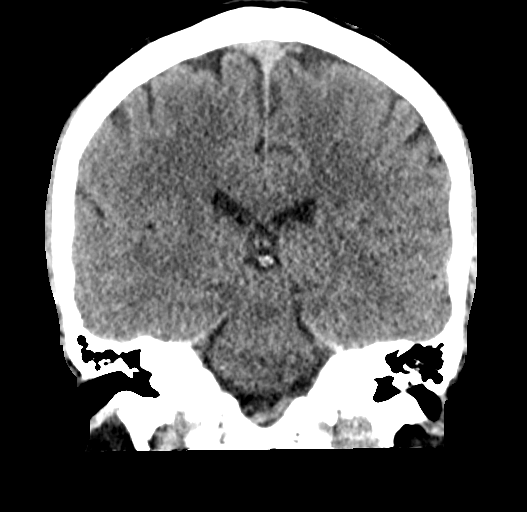

[Series 5: sagittal soft · sagittal · 0.37mm/px · 3 of 57 slices shown]
[im 19/57  brain]
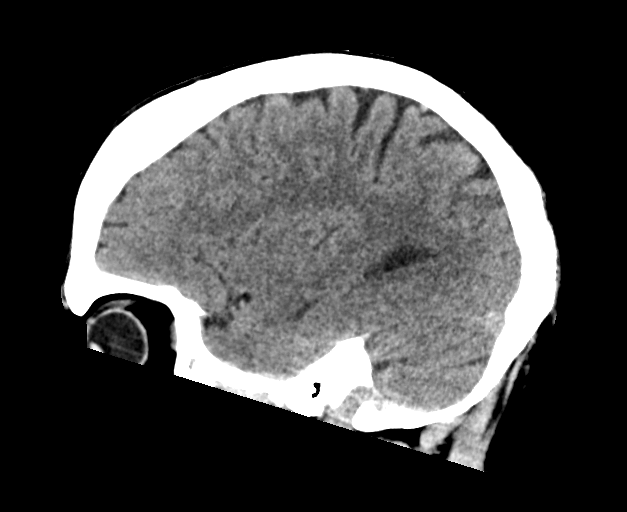
[im 29/57  brain]
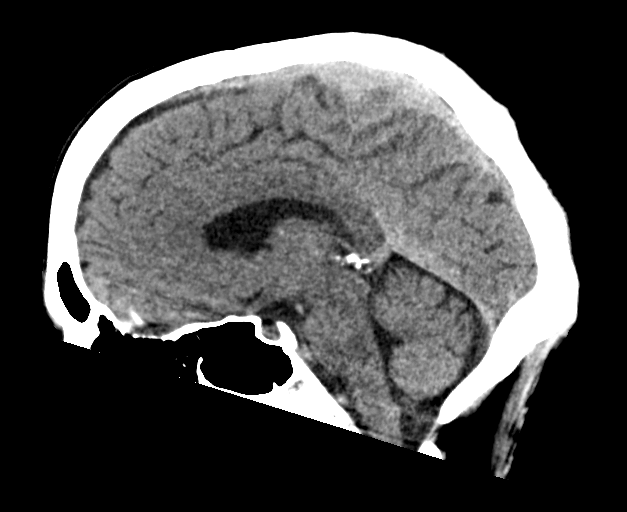
[im 38/57  brain]
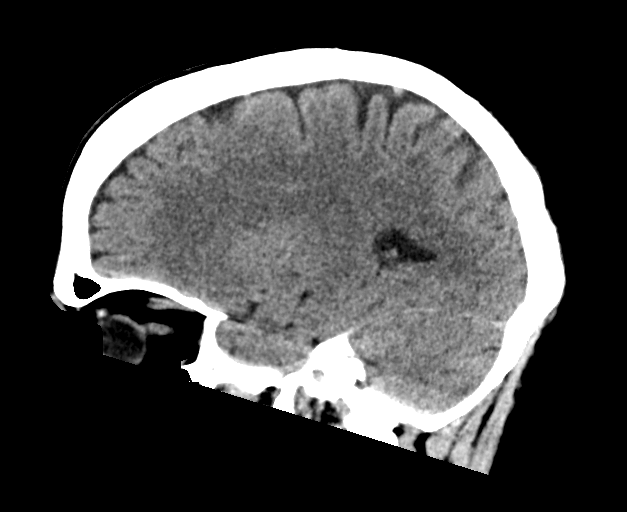

[17 of 47 positions shown; findings below may reference images not displayed]

FINDINGS: Brain: No evidence of acute infarction, hemorrhage, hydrocephalus,
extra-axial collection or mass lesion/mass effect.

Vascular: No hyperdense vessel or unexpected calcification.

Skull: Normal. Negative for fracture or focal lesion.

Sinuses/Orbits: Visualized globes and orbits are unremarkable. The
visualized sinuses are clear.

Other: None.
IMPRESSION: Normal unenhanced CT scan of the brain.

## 2021-06-02 IMAGING — CT CT ANGIO CHEST-ABD-PELV FOR DISSECTION W/ AND WO/W CM
2 of 7 series · 11 of 36 positions shown, 15 images · IV contrast (APPLIED)
Comparison: CT abdomen pelvis [DATE]

CLINICAL DATA: Evaluate for aortic dissection.

EXAM:
CT ANGIOGRAPHY CHEST, ABDOMEN AND PELVIS
TECHNIQUE: Non-contrast CT of the chest was initially obtained.

[Series 7: axial arterial · axial · arterial · 0.76mm/px · z∈[-891,-345]mm · 10 of 317 slices shown, 13 images]
[im 22/317  mediastinal]
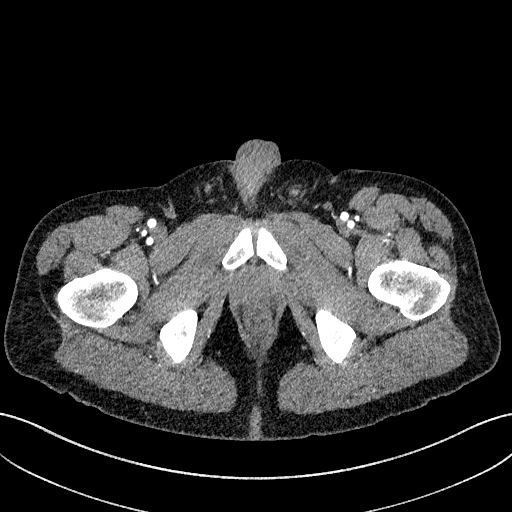
[im 22/317  bone]
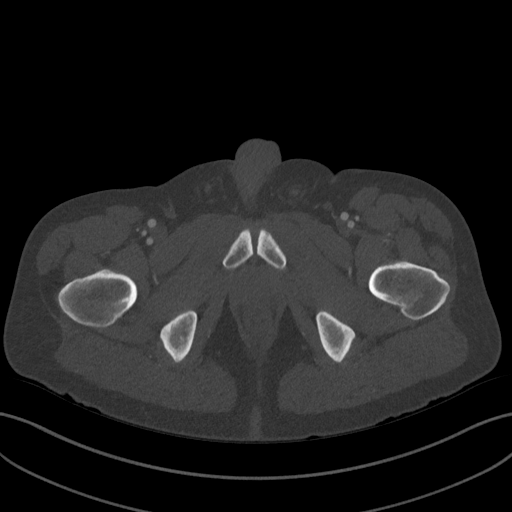
[im 64/317  mediastinal]
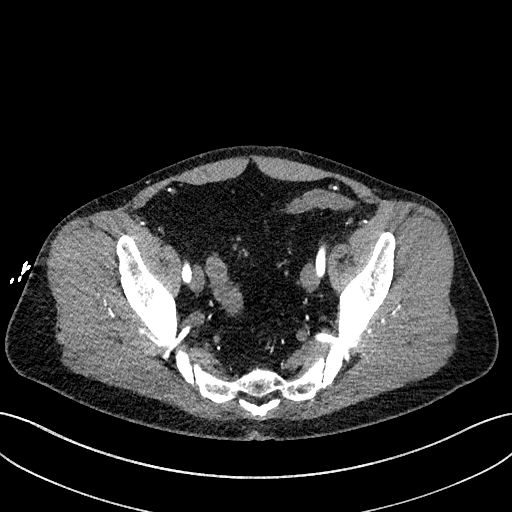
[im 106/317  mediastinal]
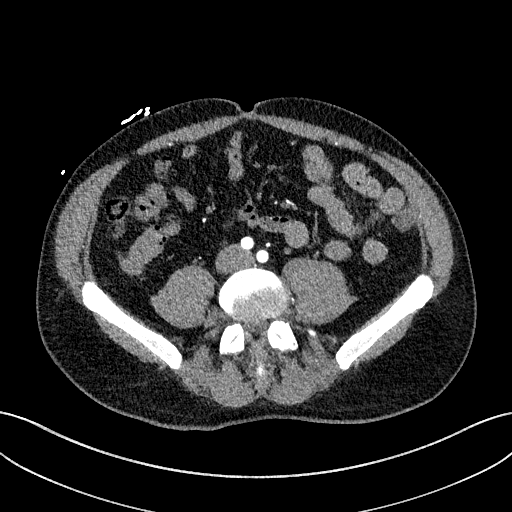
[im 148/317  mediastinal]
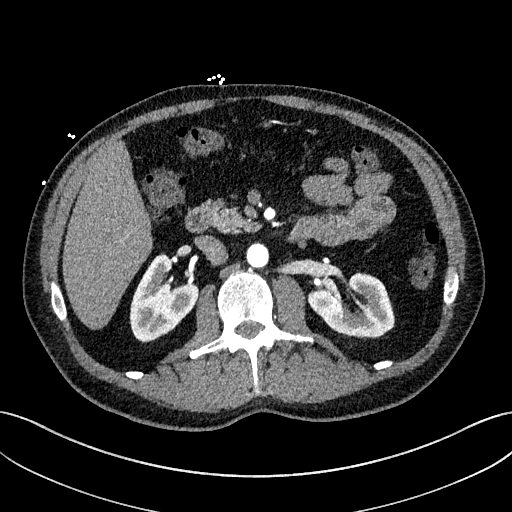
[im 169/317  mediastinal]
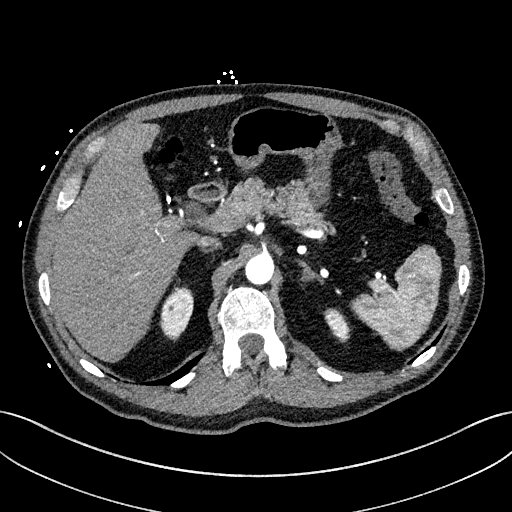
[im 211/317  mediastinal]
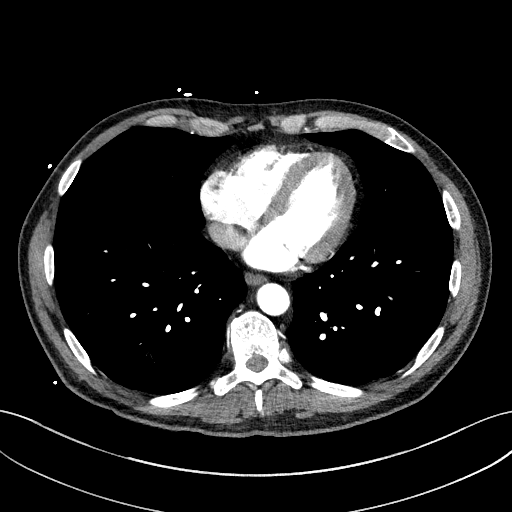
[im 232/317  lung]
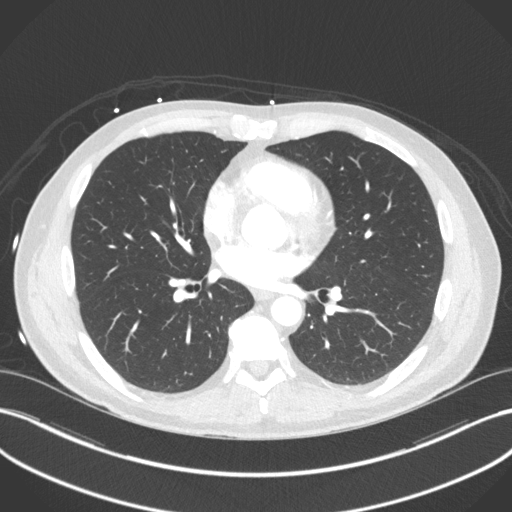
[im 253/317  mediastinal]
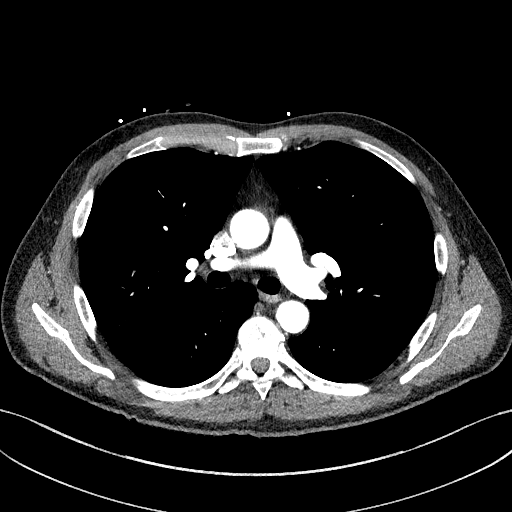
[im 253/317  lung]
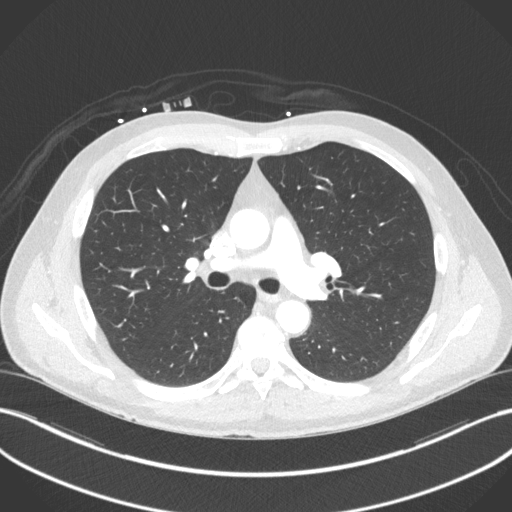
[im 274/317  lung]
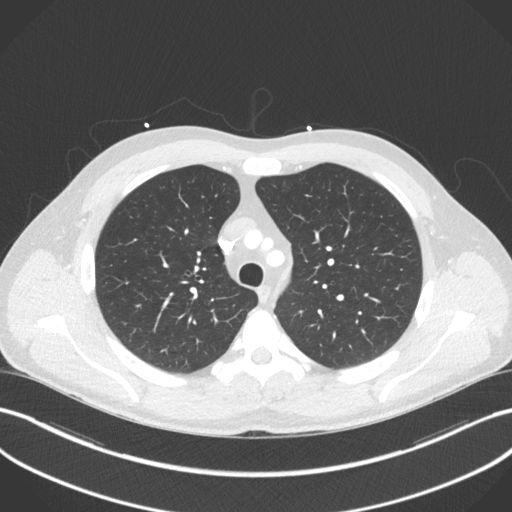
[im 295/317  mediastinal]
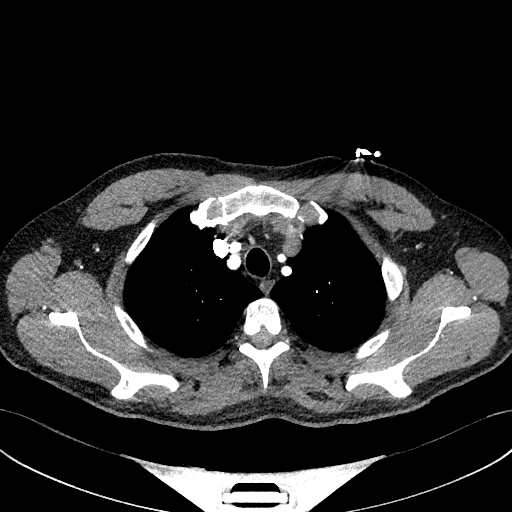
[im 295/317  lung]
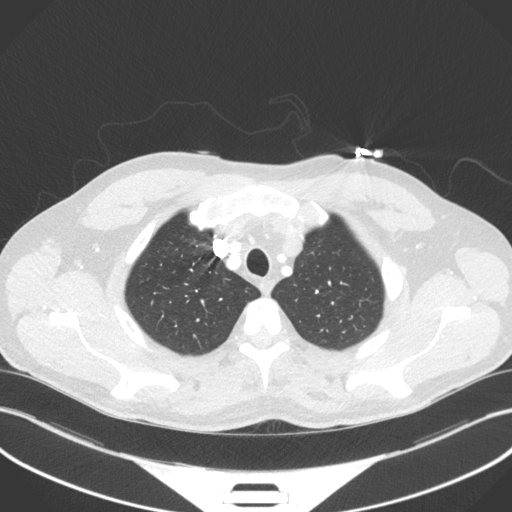

[Series 10: cor soft · coronal · 0.83mm/px · 1 of 141 slices shown, 2 images]
[im 71/141  mediastinal]
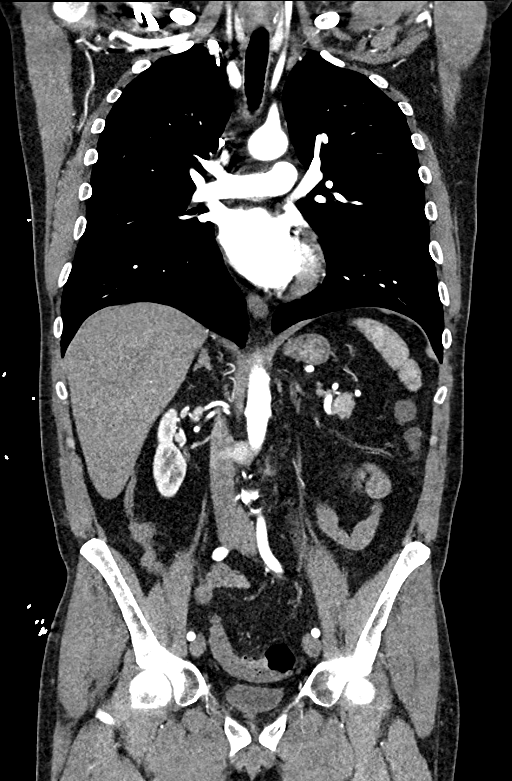
[im 71/141  bone]
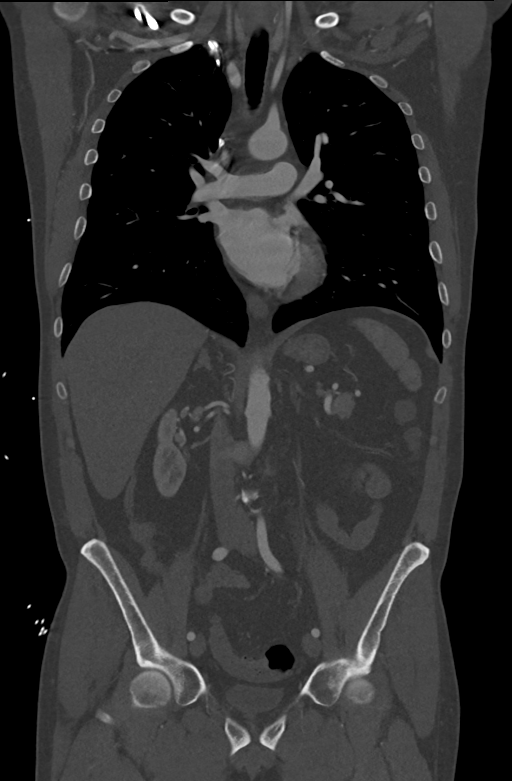

[11 of 36 positions shown; findings below may reference images not displayed]

Multidetector CT imaging through the chest, abdomen and pelvis was
performed using the standard protocol during bolus administration of
intravenous contrast. Multiplanar reconstructed images and MIPs were
obtained and reviewed to evaluate the vascular anatomy.

CONTRAST:  100mL OMNIPAQUE IOHEXOL 350 MG/ML SOLN
FINDINGS: CTA CHEST FINDINGS

Cardiovascular: Satisfactory opacification of the pulmonary arteries
to the segmental level. No evidence of pulmonary embolism. Normal
heart size. No pericardial effusion.

Mediastinum/Nodes: No enlarged mediastinal, hilar, or axillary lymph
nodes. Thyroid gland, trachea, and esophagus demonstrate no
significant findings.

Lungs/Pleura: Lungs are clear. No pleural effusion or pneumothorax.

Musculoskeletal: No chest wall abnormality. No acute or significant
osseous findings.

Review of the MIP images confirms the above findings.

CTA ABDOMEN AND PELVIS FINDINGS

VASCULAR

Aorta: Normal caliber aorta without aneurysm, dissection, vasculitis
or significant stenosis.

Celiac: Patent without evidence of aneurysm, dissection, vasculitis
or significant stenosis.

SMA: Patent without evidence of aneurysm, dissection, vasculitis or
significant stenosis.

Renals: Both renal arteries are patent without evidence of aneurysm,
dissection, vasculitis, fibromuscular dysplasia or significant
stenosis.

IMA: Patent without evidence of aneurysm, dissection, vasculitis or
significant stenosis.

Inflow: Patent without evidence of aneurysm, dissection, vasculitis
or significant stenosis.

Veins: No obvious venous abnormality within the limitations of this
arterial phase study.

Review of the MIP images confirms the above findings.

NON-VASCULAR

Hepatobiliary: Small cyst again seen in the left lobe of the liver
status post cholecystectomy. No biliary dilatation.

Pancreas: Unremarkable. No pancreatic ductal dilatation or
surrounding inflammatory changes.

Spleen: Normal in size without focal abnormality.

Adrenals/Urinary Tract: Adrenal glands are unremarkable. Kidneys are
normal, without renal calculi, focal lesion, or hydronephrosis.
Bladder is unremarkable.

Stomach/Bowel: Stomach is within normal limits. Appendix appears
normal. Diverticulosis of the sigmoid colon. Persistent thickening
of a segment of proximal sigmoid colon with resolution of the
adjacent inflammatory changes seen on prior CT [DATE].

Lymphatic: No abnormally enlarged abdominal or pelvic lymph nodes.

Reproductive: Prostate is unremarkable.

Other: Small fat containing left inguinal hernia. No abdominopelvic
ascites.

Musculoskeletal: Advanced degenerative disc disease at L5-S1 with
vacuum disc phenomenon. No acute or suspicious osseous findings.
Lipoma of the right internal oblique muscle, stable.

Review of the MIP images confirms the above findings.
IMPRESSION: 1. No acute abnormality in the chest, abdomen, or pelvis.
Specifically, no acute aortic pathology or pulmonary embolism.
2. Resolution of sigmoid diverticulitis seen on prior CT [DATE].

## 2021-06-02 MED ORDER — IOHEXOL 350 MG/ML SOLN
100.0000 mL | Freq: Once | INTRAVENOUS | Status: AC | PRN
  Administered 2021-06-02: 17:00:00 100 mL via INTRAVENOUS

## 2021-06-02 MED ORDER — ONDANSETRON HCL 4 MG/2ML IJ SOLN: 4.0000 mg | Freq: Once | INTRAMUSCULAR | Status: AC

## 2021-06-02 MED ORDER — ONDANSETRON HCL 4 MG/2ML IJ SOLN
INTRAMUSCULAR | Status: AC
  Administered 2021-06-02: 17:00:00 4 mg via INTRAVENOUS
  Filled 2021-06-02: qty 2

## 2021-06-02 NOTE — Discharge Instructions (Addendum)
The CAT scan of the brain today was normal and the CAT scan of your chest was normal without any evidence of blood clots or problems with your aorta.  No signs of heart attack and your lab work looks normal.  It is okay for you to take the muscle relaxer and anti-inflammatory for pain.  Also consider a deep tissue massage or using a racquetball and moving your arm through range of motion in that area.

## 2021-06-02 NOTE — ED Notes (Signed)
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 What brought patient here today was that his left arm is progressively worsening.

## 2021-06-02 NOTE — ED Triage Notes (Signed)
Pt presents with Left posterior shoulder blade pain x2 weeks, started radiating around to his chest and down into his Left arm. Associated with numbness and tingling to his Left arm x2 days. Recently dx w/diverticulitis. Spouse reports she has noticed pt having "sleep apnea" 3 days ago, feeling foggy and woke last night diaphoretic. Secretary called spouse and reported he was staring out in to space, not answering emails, not his usual self. Spouse is concerned for stroke or heart attack. Pt's father and daughter both have a hx of a stroke.

## 2021-06-02 NOTE — ED Notes (Signed)
Dc instructions reviewed with patient. Patient voiced understanding. Dc with belongings.  °

## 2021-06-02 NOTE — ED Notes (Signed)
Patient transported to CT 

## 2021-06-02 NOTE — ED Provider Notes (Signed)
[facility] Provider Note   CSN: [id number] Arrival date & time: 06/02/21  1333     History Chief Complaint  Patient presents with   Back Pain   Chest Pain    Raymond Kelly is a 50 y.o. male.  Patient is a 50 year old male with a history of diverticulitis who is presenting today with multiple symptoms.  Patient reports that he had an episode of diverticulitis approximately 4 weeks ago he took 10 days of antibiotics and his symptoms did resolve however for the last 2 weeks he has been having multiple new symptoms that have been worsening and he and his family are concerned that there is something else going on.  He reports that approximately 2 weeks ago he started to get pain in his left shoulder blade which initially was just an ache but has gradually worsened over the last 2 weeks and is now radiating around to the left chest and radiating down his arm and causing weakness in his left arm.  He denies any shortness of breath, syncope, vision changes or urinary symptoms.  His wife is also present in the room and they both report that he has had increased difficulty with thinking over the last 2 weeks.  It seems to come and go but seems to be a bit worse at the end of the day.  He has noticed things have slowed and even his secretary has called his wife at times saying he could not answer phone calls or type or do the things he could normally do.  He does note that he is under an extreme amount of stress and pressure at work and also was getting very poor sleep due to the pain.  After all of the symptoms started he has seen an orthopedist who has plan to do an MRI of his spine in January and he did start Lexapro and some gabapentin but it does not seem to be helping.  He did try ibuprofen at first but reported there was no difference in the discomfort.  He has had no specific shortness of breath or deep type of chest pain or palpitations.  No fever.  The history is  provided by the patient.  Back Pain Associated symptoms: chest pain   Chest Pain Associated symptoms: back pain       Past Medical History:  Diagnosis Date   Diverticulitis    Family history of adverse reaction to anesthesia    mother slow to awaken   History of kidney stones 2007    Patient Active Problem List   Diagnosis Date Noted   Alopecia areata 08/28/2016   Hyperbilirubinemia 02/04/2016   Bile leak, postoperative     Past Surgical History:  Procedure Laterality Date   CHOLECYSTECTOMY N/A 02/01/2016   Procedure: LAPAROSCOPIC CHOLECYSTECTOMY WITH INTRAOPERATIVE CHOLANGIOGRAM;  Surgeon: Coralie Keens, MD;  Location: [facility];  Service: General;  Laterality: N/A;   COLONOSCOPY     cyst removed     between his front teeth and cyst removed from abdomal area   ESOPHAGOGASTRODUODENOSCOPY (EGD) WITH PROPOFOL N/A 03/06/2016   Procedure: ESOPHAGOGASTRODUODENOSCOPY (EGD) WITH PROPOFOL;  Surgeon: Doran Stabler, MD;  Location: [facility];  Service: Gastroenterology;  Laterality: N/A;  with stent removal.   GASTROINTESTINAL STENT REMOVAL N/A 03/06/2016   Procedure: GASTROINTESTINAL STENT REMOVAL;  Surgeon: Doran Stabler, MD;  Location: [facility];  Service: Gastroenterology;  Laterality: N/A;   TONSILLECTOMY     ULNAR NERVE TRANSPOSITION  Family History  Problem Relation Age of Onset   Ulcerative colitis Mother    Irritable bowel syndrome Mother    Hypertension Mother    Arthritis Father    ALS Father    Neuropathy Father    Hypertension Father    Other Father        "open stomach"   Diabetes Brother    Juvenile Diabetes Niece    Colon cancer Neg Hx    Rectal cancer Neg Hx    Stomach cancer Neg Hx    Esophageal cancer Neg Hx     Social History   Tobacco Use   Smoking status: Never   Smokeless tobacco: Never  Vaping Use   Vaping Use: Never used  Substance Use Topics   Alcohol use: Not Currently    Comment: social at times   Drug use: No     Home Medications Prior to Admission medications   Medication Sig Start Date End Date Taking? Authorizing Provider  simethicone (MYLICON) 80 MG chewable tablet Chew 80-160 mg by mouth every 6 (six) hours as needed for flatulence.  09/07/19  [provider]    Allergies    No known allergies  Review of Systems   Review of Systems  Cardiovascular:  Positive for chest pain.  Musculoskeletal:  Positive for back pain.  All other systems reviewed and are negative.  Physical Exam Updated Vital Signs BP 125/89   Pulse 68   Temp 98.5 F (36.9 C)   Resp 16   SpO2 98%   Physical Exam Vitals and nursing note reviewed.  Constitutional:      General: He is not in acute distress.    Appearance: He is well-developed.  HENT:     Head: Normocephalic and atraumatic.  Eyes:     General: No visual field deficit.    Conjunctiva/sclera: Conjunctivae normal.     Pupils: Pupils are equal, round, and reactive to light.  Cardiovascular:     Rate and Rhythm: Normal rate and regular rhythm.     Pulses: Normal pulses.     Heart sounds: No murmur heard. Pulmonary:     Effort: Pulmonary effort is normal. No respiratory distress.     Breath sounds: Normal breath sounds. No wheezing or rales.  Chest:     Chest wall: No tenderness.  Abdominal:     General: There is no distension.     Palpations: Abdomen is soft.     Tenderness: There is no abdominal tenderness. There is no guarding or rebound.  Musculoskeletal:        General: No tenderness. Normal range of motion.     Cervical back: Normal range of motion and neck supple.     Right lower leg: No edema.     Left lower leg: No edema.  Skin:    General: Skin is warm and dry.     Findings: No erythema or rash.  Neurological:     Mental Status: He is alert and oriented to person, place, and time. Mental status is at baseline.     Cranial Nerves: No cranial nerve deficit, dysarthria or facial asymmetry.     Sensory: Sensation is  intact.     Motor: Weakness present. No pronator drift.     Coordination: Coordination normal.     Gait: Gait is intact.     Comments: 4/5 strength in left tricep.  5/5 strength in bicep, hand grip and shoulder  Psychiatric:  Mood and Affect: Mood normal.        Behavior: Behavior normal.    ED Results / Procedures / Treatments   Labs (all labs ordered are listed, but only abnormal results are displayed) Labs Reviewed  BASIC METABOLIC PANEL - Abnormal; Notable for the following components:      Result Value   Glucose, Bld 122 (*)    All other components within normal limits  CBC  TROPONIN I (HIGH SENSITIVITY)    EKG EKG Interpretation  Date/Time:  Sunday June 02 2021 13:45:25 EST Ventricular Rate:  75 PR Interval:  138 QRS Duration: 96 QT Interval:  376 QTC Calculation: 419 R Axis:   44 Text Interpretation: Normal sinus rhythm Normal ECG No significant change since last tracing Confirmed by Blanchie Dessert [phone]) on 06/02/2021 4:29:42 PM  Radiology DG Chest 2 View  Result Date: 06/02/2021 CLINICAL DATA:  Chest pain EXAM: CHEST - 2 VIEW COMPARISON:  Chest x-ray 09/13/2019. FINDINGS: The heart and mediastinal contours are within normal limits. No focal consolidation. No pulmonary edema. No pleural effusion. No pneumothorax. No acute osseous abnormality. IMPRESSION: No active cardiopulmonary disease. Electronically Signed   By: Iven Finn M.D.   On: 06/02/2021 15:19   CT Head Wo Contrast  Result Date: 06/02/2021 CLINICAL DATA:  Pt presents with Left posterior shoulder blade pain x2 weeks, started radiating around to his chest and down into his Left arm. Associated with numbness and tingling to his Left arm x2 days. Recently dx w/diverticulitis. Spouse reports she has noticed pt having "sleep apnea" 3 days ago, feeling foggy and woke last night diaphoretic. Secretary called spouse and reported he was staring out in to space, not answering emails, not his usual  self. Spouse is concerned for stroke or heart attack. Pt's father and daughter both have a hx of a stroke EXAM: CT HEAD WITHOUT CONTRAST TECHNIQUE: Contiguous axial images were obtained from the base of the skull through the vertex without intravenous contrast. COMPARISON:  09/13/2019 FINDINGS: Brain: No evidence of acute infarction, hemorrhage, hydrocephalus, extra-axial collection or mass lesion/mass effect. Vascular: No hyperdense vessel or unexpected calcification. Skull: Normal. Negative for fracture or focal lesion. Sinuses/Orbits: Visualized globes and orbits are unremarkable. The visualized sinuses are clear. Other: None. IMPRESSION: Normal unenhanced CT scan of the brain. Electronically Signed   By: Lajean Manes M.D.   On: 06/02/2021 17:38   CT Angio Chest/Abd/Pel for Dissection W and/or Wo Contrast  Result Date: 06/02/2021 CLINICAL DATA:  Evaluate for aortic dissection. EXAM: CT ANGIOGRAPHY CHEST, ABDOMEN AND PELVIS TECHNIQUE: Non-contrast CT of the chest was initially obtained. Multidetector CT imaging through the chest, abdomen and pelvis was performed using the standard protocol during bolus administration of intravenous contrast. Multiplanar reconstructed images and MIPs were obtained and reviewed to evaluate the vascular anatomy. CONTRAST:  170mL OMNIPAQUE IOHEXOL 350 MG/ML SOLN COMPARISON:  CT abdomen pelvis 05/03/2021 FINDINGS: CTA CHEST FINDINGS Cardiovascular: Satisfactory opacification of the pulmonary arteries to the segmental level. No evidence of pulmonary embolism. Normal heart size. No pericardial effusion. Mediastinum/Nodes: No enlarged mediastinal, hilar, or axillary lymph nodes. Thyroid gland, trachea, and esophagus demonstrate no significant findings. Lungs/Pleura: Lungs are clear. No pleural effusion or pneumothorax. Musculoskeletal: No chest wall abnormality. No acute or significant osseous findings. Review of the MIP images confirms the above findings. CTA ABDOMEN AND PELVIS  FINDINGS VASCULAR Aorta: Normal caliber aorta without aneurysm, dissection, vasculitis or significant stenosis. Celiac: Patent without evidence of aneurysm, dissection, vasculitis or significant stenosis. SMA: Patent without  evidence of aneurysm, dissection, vasculitis or significant stenosis. Renals: Both renal arteries are patent without evidence of aneurysm, dissection, vasculitis, fibromuscular dysplasia or significant stenosis. IMA: Patent without evidence of aneurysm, dissection, vasculitis or significant stenosis. Inflow: Patent without evidence of aneurysm, dissection, vasculitis or significant stenosis. Veins: No obvious venous abnormality within the limitations of this arterial phase study. Review of the MIP images confirms the above findings. NON-VASCULAR Hepatobiliary: Small cyst again seen in the left lobe of the liver status post cholecystectomy. No biliary dilatation. Pancreas: Unremarkable. No pancreatic ductal dilatation or surrounding inflammatory changes. Spleen: Normal in size without focal abnormality. Adrenals/Urinary Tract: Adrenal glands are unremarkable. Kidneys are normal, without renal calculi, focal lesion, or hydronephrosis. Bladder is unremarkable. Stomach/Bowel: Stomach is within normal limits. Appendix appears normal. Diverticulosis of the sigmoid colon. Persistent thickening of a segment of proximal sigmoid colon with resolution of the adjacent inflammatory changes seen on prior CT 05/03/2021. Lymphatic: No abnormally enlarged abdominal or pelvic lymph nodes. Reproductive: Prostate is unremarkable. Other: Small fat containing left inguinal hernia. No abdominopelvic ascites. Musculoskeletal: Advanced degenerative disc disease at L5-S1 with vacuum disc phenomenon. No acute or suspicious osseous findings. 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 Review of the MIP images confirms the above findings. IMPRESSION: 1. No acute abnormality in the chest, abdomen, or pelvis.  Specifically, no acute aortic pathology or pulmonary embolism. 2. Resolution of sigmoid diverticulitis seen on prior CT 05/03/2021. Electronically Signed   By: Ileana Roup M.D.   On: 06/02/2021 18:09    Procedures Procedures   Medications Ordered in ED Medications  iohexol (OMNIPAQUE) 350 MG/ML injection 100 mL (100 mLs Intravenous Contrast Given 06/02/21 1728)  ondansetron (ZOFRAN) injection 4 mg (4 mg Intravenous Given 06/02/21 1726)    ED Course  I have reviewed the triage vital signs and the nursing notes.  Pertinent labs & imaging results that were available during my care of the patient were reviewed by me and considered in my medical decision making (see chart for details).    MDM Rules/Calculators/A&P                           Patient is a 50 year old male presenting with multiple symptoms today.  Pain in the left scapula that radiates into the arm and has some weakness in the left tricep.  Also having some mental clouding and issues with comprehension.  Patient does have strong family history of stroke and they had that concern.  He has seen an orthopedist and has planned for an MRI of the spine as this could be musculoskeletal, radicular in nature.  However today they came just to ensure there is nothing else going on.  We will do a CT of the head to ensure no evidence of stroke in the last few weeks as his symptoms have been persistent.  Also will do a CT of the chest abdomen pelvis to rule out dissection given the sudden onset of his pain.  Low suspicion for ACS at this time.  EKG and troponin are normal.  Labs show normal electrolytes, hemoglobin and renal function.  7:05 PM Imaging is negative for any acute findings.  Low suspicion for stroke at this time, dissection, PE, ACS.  Suspect cervical radiculopathy or muscular related issue.  Patient to continue current therapies, follow-up with orthopedics and possible physical therapy or did deep tissue massage.  MDM   Amount  and/or Complexity of Data Reviewed Clinical lab tests: ordered  and reviewed Tests in the radiology section of CPT: ordered and reviewed Tests in the medicine section of CPT: ordered and reviewed Independent visualization of images, tracings, or specimens: yes    Final Clinical Impression(s) / ED Diagnoses Final diagnoses:  Cervical radiculopathy    Rx / DC Orders ED Discharge Orders     None        Blanchie Dessert, MD 06/02/21 1905

## 2021-06-17 ENCOUNTER — Telehealth: Payer: Self-pay | Admitting: Gastroenterology

## 2021-06-17 NOTE — Telephone Encounter (Signed)
Called and spoke with patient. He reports that he went to [facility] urgent care yesterday for diverticulitis flare. Patient states that he was prescribed Cipro 500 mg BID and Metronidazole 500 mg BID for 10 days. Pt states that they did not do any imaging. Pt advised that this is usually the recommended treatment for diverticulitis flare. Pt wanted to know if there was anything else that you think he needs to do? Please advise, thanks.

## 2021-06-17 NOTE — Telephone Encounter (Signed)
Inbound call from patient states that he had a Diverticulitis on Saturday night, he went to Urgent Care Sunday morning and they give him some medications and states that he wants to make sure they give him the right thing and if Dr. Loletha Carrow wants him to continue those medication. Please advise.

## 2021-06-18 NOTE — Telephone Encounter (Signed)
Thank you for the note (I was out of the office 12/12)  Yes, that is what we have used to treat him before.  Urgent care facilities do not have CT scanners, but a scan is not always necessary if typical symptoms and exam findings.  No additional recommendations now other than he needs a clinic visit with me in 2-3 weeks (or next available after that)  - HD

## 2021-06-18 NOTE — Telephone Encounter (Signed)
Called and spoke with patient in regards to information below. Pt has been scheduled for a follow up with Dr. Loletha Carrow on Tuesday, 07/09/21 at 8:20 am. Answered all of patient's questions regarding his diverticulitis flare. Pt verbalized understanding of all information and had no concerns at the end of the call.

## 2021-06-26 ENCOUNTER — Telehealth: Payer: Self-pay | Admitting: Gastroenterology

## 2021-06-26 NOTE — Telephone Encounter (Signed)
Patient called stating he just had his second bout with diverticulitis flare-up and finished his second course of antibiotics yesterday.  He still feels gassy like it does when the flares come, but he doesn't feel like he can withstand another 10-day course of antibiotics.  Also, he has a pinched nerve in his neck and cannot get the injection for it while he is on antibiotics.  Patient is worried what to do if he does have another flare-up.  Can you please call and advise?  Thank you.

## 2021-06-26 NOTE — Telephone Encounter (Signed)
Left message on machine to call back  

## 2021-06-27 ENCOUNTER — Other Ambulatory Visit: Payer: Self-pay

## 2021-06-27 DIAGNOSIS — K5732 Diverticulitis of large intestine without perforation or abscess without bleeding: Secondary | ICD-10-CM

## 2021-06-27 MED ORDER — DICYCLOMINE HCL 10 MG PO CAPS: 10.0000 mg | ORAL_CAPSULE | Freq: Two times a day (BID) | ORAL | 0 refills | Status: DC | PRN

## 2021-06-27 NOTE — Telephone Encounter (Signed)
Pt reports that he had severe pain 8/10 this morning that went away. He also stated that he had one loose bowel movement. He just finished 10 days of flagyl and cipro. Pt is concerned that he might need to go back on antibiotics because he is supposed to get a shot for a pinched nerve in his neck on 12/27 and was told he cannot be on antibiotics. Pt said he has been eating soft foods and avoiding foods that could trigger a flare. Please advise.

## 2021-06-27 NOTE — Telephone Encounter (Signed)
Spoke with pt and gave him Dr. Steve Rattler recommendations. Pt knows to come in for CBC and CMP and lab orders are in epic. Sent prescription to patients pharmacy and pt knows to let us know if he develops fever or chills. Pt asked about what diet he should follow. Sent pt low fiber diet instructions through MyChart. Pt verbalized understanding and had no other concerns at end of call.

## 2021-06-27 NOTE — Telephone Encounter (Signed)
Patient called back.  Did not get the message.  Please call and advise.  Still feels like he is having another attack.  Severe pain this morning.  Thank you.

## 2021-06-27 NOTE — Telephone Encounter (Signed)
I have reviewed recent CTA Abdo/pelvis He has been given 10-day course of antibiotics Has intermittent abdominal pain which comes and goes without fever or chills This sounds more like spasms rather than acute diverticulitis  Plan: -Check CBC, CMP -Start Bentyl 10 mg p.o. twice daily until follow-up visit -He already has follow-up appointment with Dr. Loletha Carrow -Please let us know if he develops fever or chills. -Hold off on more antibiotics at this time unless white cell count is significantly elevated  RG

## 2021-06-28 ENCOUNTER — Other Ambulatory Visit (INDEPENDENT_AMBULATORY_CARE_PROVIDER_SITE_OTHER): Payer: 59

## 2021-06-28 DIAGNOSIS — K5732 Diverticulitis of large intestine without perforation or abscess without bleeding: Secondary | ICD-10-CM

## 2021-06-28 LAB — COMPREHENSIVE METABOLIC PANEL
ALT: 87 U/L — ABNORMAL HIGH (ref 0–53)
AST: 58 U/L — ABNORMAL HIGH (ref 0–37)
Albumin: 4.3 g/dL (ref 3.5–5.2)
Alkaline Phosphatase: 69 U/L (ref 39–117)
BUN: 11 mg/dL (ref 6–23)
CO2: 30 mEq/L (ref 19–32)
Calcium: 9.1 mg/dL (ref 8.4–10.5)
Chloride: 103 mEq/L (ref 96–112)
Creatinine, Ser: 1.03 mg/dL (ref 0.40–1.50)
GFR: 83.29 mL/min (ref >60.00)
Glucose, Bld: 104 mg/dL — ABNORMAL HIGH (ref 70–99)
Potassium: 3.7 mEq/L (ref 3.5–5.1)
Sodium: 140 mEq/L (ref 135–145)
Total Bilirubin: 1.4 mg/dL — ABNORMAL HIGH (ref 0.2–1.2)
Total Protein: 6.9 g/dL (ref 6.0–8.3)

## 2021-06-28 LAB — CBC WITH DIFFERENTIAL/PLATELET
Basophils Absolute: 0.1 10*3/uL (ref 0.0–0.1)
Basophils Relative: 0.7 % (ref 0.0–3.0)
Eosinophils Absolute: 0.1 10*3/uL (ref 0.0–0.7)
Eosinophils Relative: 1 % (ref 0.0–5.0)
HCT: 43.9 % (ref 39.0–52.0)
Hemoglobin: 14.9 g/dL (ref 13.0–17.0)
Lymphocytes Relative: 15.5 % (ref 12.0–46.0)
Lymphs Abs: 1.1 10*3/uL (ref 0.7–4.0)
MCHC: 33.8 g/dL (ref 30.0–36.0)
MCV: 92.7 fl (ref 78.0–100.0)
Monocytes Absolute: 0.7 10*3/uL (ref 0.1–1.0)
Monocytes Relative: 9.6 % (ref 3.0–12.0)
Neutro Abs: 5.3 10*3/uL (ref 1.4–7.7)
Neutrophils Relative %: 73.2 % (ref 43.0–77.0)
Platelets: 242 10*3/uL (ref 150.0–400.0)
RBC: 4.74 Mil/uL (ref 4.22–5.81)
RDW: 13 % (ref 11.5–15.5)
WBC: 7.3 10*3/uL (ref 4.0–10.5)

## 2021-07-07 DIAGNOSIS — K5190 Ulcerative colitis, unspecified, without complications: Secondary | ICD-10-CM

## 2021-07-07 HISTORY — DX: Ulcerative colitis, unspecified, without complications: K51.90

## 2021-07-07 NOTE — Progress Notes (Signed)
07/09/21- 50 yoM never smoker for sleep evaluation courtesy of Clyde Lundborg, PA-C with concern of OSA Medical problem list includes Alopecia, Hyperbilirubinemia, hx kidney stones, Diverticulosis, Covid infection [id number],  Epworth score-15 Body weight today-174 lbs Covid vax-no Flu vax-no Wife has recorded and videoed him stopping breathing.  Mother has CPAP. He is up to bathroom 4-5 times per night and has seen urologist.  Refers to work stress that keeps his mind busy at night.  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  Melatonin did not help sleep quality.  Benadryl carried over too long. ENT surgery for tonsils.  Denies heart or lung disease.  Denies complex parasomnias. Described brain fog after 2 episodes Covid. CTa chest/abd/pelvis 06/02/21- IMPRESSION: 1. No acute abnormality in the chest, abdomen, or pelvis. Specifically, no acute aortic pathology or pulmonary embolism. 2. Resolution of sigmoid diverticulitis seen on prior CT 05/03/2021.  Prior to Admission medications   Medication Sig Start Date End Date Taking? Authorizing Provider  dicyclomine (BENTYL) 10 MG capsule Take 1 capsule (10 mg total) by mouth 2 (two) times daily as needed for spasms. 06/27/21  Yes Jackquline Denmark, MD  simethicone (MYLICON) 80 MG chewable tablet Chew 80-160 mg by mouth every 6 (six) hours as needed for flatulence.  09/07/19  [provider]   Past Medical History:  Diagnosis Date   Diverticulitis    Family history of adverse reaction to anesthesia    mother slow to awaken   History of kidney stones 2007   Past Surgical History:  Procedure Laterality Date   CHOLECYSTECTOMY N/A 02/01/2016   Procedure: LAPAROSCOPIC CHOLECYSTECTOMY WITH INTRAOPERATIVE CHOLANGIOGRAM;  Surgeon: Coralie Keens, MD;  Location: [facility];  Service: General;  Laterality: N/A;   COLONOSCOPY     cyst removed     between his front teeth and cyst removed from abdomal area    ESOPHAGOGASTRODUODENOSCOPY (EGD) WITH PROPOFOL N/A 03/06/2016   Procedure: ESOPHAGOGASTRODUODENOSCOPY (EGD) WITH PROPOFOL;  Surgeon: Doran Stabler, MD;  Location: [facility];  Service: Gastroenterology;  Laterality: N/A;  with stent removal.   GASTROINTESTINAL STENT REMOVAL N/A 03/06/2016   Procedure: GASTROINTESTINAL STENT REMOVAL;  Surgeon: Doran Stabler, MD;  Location: [facility];  Service: Gastroenterology;  Laterality: N/A;   TONSILLECTOMY     ULNAR NERVE TRANSPOSITION     Family History  Problem Relation Age of Onset   Ulcerative colitis Mother    Irritable bowel syndrome Mother    Hypertension Mother    Arthritis Father    ALS Father    Neuropathy Father    Hypertension Father    Other Father        "open stomach"   Diabetes Brother    Juvenile Diabetes Niece    Colon cancer Neg Hx    Rectal cancer Neg Hx    Stomach cancer Neg Hx    Esophageal cancer Neg Hx    Social History   Socioeconomic History   Marital status: Married    Spouse name: Not on file   Number of children: 2   Years of education: Not on file   Highest education level: Not on file  Occupational History   Occupation: sales  Tobacco Use   Smoking status: Never   Smokeless tobacco: Never  Vaping Use   Vaping Use: Never used  Substance and Sexual Activity   Alcohol use: Not Currently    Comment: social at times   Drug use: No   Sexual activity: Not  on file  Other Topics Concern   Not on file  Social History Narrative   Not on file   Social Determinants of Health   Financial Resource Strain: Not on file  Food Insecurity: Not on file  Transportation Needs: Not on file  Physical Activity: Not on file  Stress: Not on file  Social Connections: Not on file  Intimate Partner Violence: Not on file   ROS-see HPI   + = positive Constitutional:    weight loss, night sweats, fevers, chills, fatigue, lassitude. HEENT:    headaches, difficulty swallowing, tooth/dental problems, sore  throat,       sneezing, itching, ear ache, +nasal congestion, post nasal drip, snoring CV:    chest pain, orthopnea, PND, swelling in lower extremities, anasarca,                                  dizziness, palpitations Resp:   shortness of breath with exertion or at rest.                +productive cough,   non-productive cough, coughing up of blood.              change in color of mucus.  wheezing.   Skin:    rash or lesions. GI:  No-   heartburn, indigestion, +abdominal pain, nausea, vomiting, diarrhea,                 change in bowel habits, loss of appetite GU: dysuria, change in color of urine, no urgency or frequency.   flank pain. MS:   joint pain, stiffness, decreased range of motion, back pain. Neuro-     nothing unusual Psych:  change in mood or affect.  +depression or +anxiety.   memory loss.  OBJ- Physical Exam General- Alert, Oriented, Affect-appropriate, Distress- none acute, not obese Skin- rash-none, lesions- none, excoriation- none       Wore cap Lymphadenopathy- none Head- atraumatic            Eyes- Gross vision intact, PERRLA, conjunctivae and secretions clear            Ears- Hearing, canals-normal            Nose- Clear, no-Septal dev, mucus, polyps, erosion, perforation             Throat- Mallampati III , +tongue coated, drainage- none, tonsils absent, + teeth Neck- flexible , trachea midline, no stridor , thyroid nl, carotid no bruit Chest - symmetrical excursion , unlabored           Heart/CV- RRR , no murmur , no gallop  , no rub, nl s1 s2                           - JVD- none , edema- none, stasis changes- none, varices- none           Lung- clear to P&A, wheeze- none, cough- none , dullness-none, rub- none           Chest wall-  Abd-  Br/ Gen/ Rectal- Not done, not indicated Extrem- cyanosis- none, clubbing, none, atrophy- none, strength- nl Neuro- grossly intact to observation

## 2021-07-09 ENCOUNTER — Encounter: Payer: Self-pay | Admitting: Gastroenterology

## 2021-07-09 ENCOUNTER — Ambulatory Visit (INDEPENDENT_AMBULATORY_CARE_PROVIDER_SITE_OTHER): Payer: 59 | Admitting: Internal Medicine

## 2021-07-09 ENCOUNTER — Encounter: Payer: Self-pay | Admitting: Internal Medicine

## 2021-07-09 ENCOUNTER — Other Ambulatory Visit: Payer: Self-pay

## 2021-07-09 ENCOUNTER — Ambulatory Visit (INDEPENDENT_AMBULATORY_CARE_PROVIDER_SITE_OTHER): Payer: 59 | Admitting: Gastroenterology

## 2021-07-09 VITALS — BP 96/62 | HR 72 | Ht 68.0 in | Wt 175.0 lb

## 2021-07-09 VITALS — BP 100/70 | HR 64 | Temp 98.3°F | Ht 68.0 in | Wt 174.0 lb

## 2021-07-09 DIAGNOSIS — F5101 Primary insomnia: Secondary | ICD-10-CM

## 2021-07-09 DIAGNOSIS — K5792 Diverticulitis of intestine, part unspecified, without perforation or abscess without bleeding: Secondary | ICD-10-CM | POA: Diagnosis not present

## 2021-07-09 DIAGNOSIS — R143 Flatulence: Secondary | ICD-10-CM

## 2021-07-09 DIAGNOSIS — R7989 Other specified abnormal findings of blood chemistry: Secondary | ICD-10-CM

## 2021-07-09 DIAGNOSIS — G4700 Insomnia, unspecified: Secondary | ICD-10-CM | POA: Insufficient documentation

## 2021-07-09 DIAGNOSIS — R0683 Snoring: Secondary | ICD-10-CM | POA: Diagnosis not present

## 2021-07-09 DIAGNOSIS — G4733 Obstructive sleep apnea (adult) (pediatric): Secondary | ICD-10-CM | POA: Insufficient documentation

## 2021-07-09 NOTE — Assessment & Plan Note (Signed)
Waking several times for bathroom but also busy brain attributed to work stress. Preliminary discussion done. Plan- consider Rx once we know results of sleep study.

## 2021-07-09 NOTE — Progress Notes (Signed)
[facility] Progress Note  Chief Complaint: Diverticulitis  Subjective  History: Raymond Kelly was last seen November 8 after an episode of diverticulitis treated with 10 days of ciprofloxacin and metronidazole.  He also had elevated LFTs of unclear cause and history of bile leak requiring ERCP in 2017.  I had planned an MRCP to rule out CBD stone, but his LFTs returned to normal and the MRI was canceled. He called the office December 12 after having been to a local urgent care for abdominal pain, no imaging performed and he was treated empirically by the clinic for diverticulitis with metronidazole and ciprofloxacin for 10 days.  Raymond Kelly tells me his symptoms completely resolved after the first episode, then recurred with the episode that brought him to the urgent care.  At second episode felt much like the first, with bandlike lower abdominal pain and some in the left upper quadrant, tender to palpation.  All that considerably improved after another round of antibiotics, but since then he has had some intermittent bloating and gas might be accompanied by loose stool.  This may last a day or 2 and then resolved.  He is wondered if it could still be diverticulitis.  Was prescribed some dicyclomine, though has not needed it since things improved  ROS: Cardiovascular:  no chest pain Respiratory: no dyspnea  The patient's Past Medical, Family and Social History were reviewed and are on file in the EMR.  Objective:  Med list reviewed  Current Outpatient Medications:    dicyclomine (BENTYL) 10 MG capsule, Take 1 capsule (10 mg total) by mouth 2 (two) times daily as needed for spasms., Disp: 60 capsule, Rfl: 0   Vital signs in last 24 hrs: Vitals:   07/09/21 0821  BP: 96/62  Pulse: 72   Wt Readings from Last 3 Encounters:  07/09/21 175 lb (79.4 kg)  05/14/21 186 lb 9.6 oz (84.6 kg)  05/03/21 196 lb (88.9 kg)    Physical Exam  Well-appearing HEENT: sclera anicteric, oral mucosa moist  without lesions Neck: supple, no thyromegaly, JVD or lymphadenopathy Cardiac: RRR without murmurs, S1S2 heard, no peripheral edema Pulm: clear to auscultation bilaterally, normal RR and effort noted Abdomen: soft, no tenderness, with active bowel sounds. No guarding or palpable hepatosplenomegaly. Skin; warm and dry, no jaundice or rash  Labs: Hepatic Function Latest Ref Rng & Units 06/28/2021 05/14/2021 05/03/2021  Total Protein 6.0 - 8.3 g/dL 6.9 6.8 6.7  Albumin 3.5 - 5.2 g/dL 4.3 4.3 4.2  AST 0 - 37 U/L 58(H) 26 78(H)  ALT 0 - 53 U/L 87(H) 43 160(H)  Alk Phosphatase 39 - 117 U/L 69 81 134(H)  Total Bilirubin 0.2 - 1.2 mg/dL 1.4(H) 0.9 1.6(H)  Bilirubin, Direct 0.0 - 0.3 mg/dL - 0.2 -    CBC Latest Ref Rng & Units 06/28/2021 06/02/2021 05/03/2021  WBC 4.0 - 10.5 K/uL 7.3 7.4 12.1(H)  Hemoglobin 13.0 - 17.0 g/dL 14.9 15.5 15.6  Hematocrit 39.0 - 52.0 % 43.9 45.2 45.7  Platelets 150.0 - 400.0 K/uL 242.0 229 244    ___________________________________________ Radiologic studies:   ____________________________________________ Other:   _____________________________________________ Assessment & Plan  Assessment: Encounter Diagnoses  Name Primary?   Acute diverticulitis Yes   Flatulence    LFT elevation    Second episode acute diverticulitis, occurring about 6 weeks after the first episode.  Both times resolved with antibiotics, he still has some intermittent irregularity with gas, I do not think this is persistent diverticulitis.  Most likely  altered colonic microbiota from antibiotics with an IBS-like picture.  Expected will improve with time, can use dicyclomine as needed.  Liver labs increased again, to a lesser degree than when he had his first episode of diverticulitis.  This seems to be a hepatic reaction to the acute infection rather than it being from an underlying chronic liver disease. Overall clinical picture does not suggest retained CBD stone.  Plan: Recheck  hepatic function panel in 7 to 10 days.  If remains elevated, additional serologic work-up Raymond Kelly was wondering if he needed a repeat colonoscopy, but I do not feel that is necessary at this point.  His diagnosis of diverticulitis seems clear.  30 minutes were spent on this encounter (including chart review, history/exam, counseling/coordination of care, and documentation) > 50% of that time was spent on counseling and coordination of care.   [name]

## 2021-07-09 NOTE — Patient Instructions (Signed)
Order- schedule home sleep test    dx Snoring  Please call us for results and recommendations about 2 weeks after your sleep test.

## 2021-07-09 NOTE — Patient Instructions (Signed)
Your provider has requested that you go to the basement level for lab work in 7-10 days. Press "B" on the elevator. The lab is located at the first door on the left as you exit the elevator.  If you are age 53 or older, your body mass index should be between 23-30. Your Body mass index is 26.61 kg/m. If this is out of the aforementioned range listed, please consider follow up with your Primary Care Provider.  If you are age 29 or younger, your body mass index should be between 19-25. Your Body mass index is 26.61 kg/m. If this is out of the aformentioned range listed, please consider follow up with your Primary Care Provider.   ________________________________________________________  The [facility] providers would like to encourage you to use [facility] to communicate with providers for non-urgent requests or questions.  Due to long hold times on the telephone, sending your provider a message by [facility] may be a faster and more efficient way to get a response.  Please allow 48 business hours for a response.  Please remember that this is for non-urgent requests.  _______________________________________________________ Due to recent changes in healthcare laws, you may see the results of your imaging and laboratory studies on MyChart before your provider has had a chance to review them.  We understand that in some cases there may be results that are confusing or concerning to you. Not all laboratory results come back in the same time frame and the provider may be waiting for multiple results in order to interpret others.  Please give [place] 48 hours in order for your provider to thoroughly review all the results before contacting the office for clarification of your results.   It was a pleasure to see you today!  Thank you for trusting me with your gastrointestinal care!

## 2021-07-09 NOTE — Assessment & Plan Note (Signed)
Likely OSA. Appropriate discussion. Questions answered. Safe driving.  Plan- sleep study.

## 2021-07-19 ENCOUNTER — Other Ambulatory Visit: Payer: Self-pay | Admitting: Gastroenterology

## 2021-07-22 ENCOUNTER — Telehealth: Payer: Self-pay | Admitting: Gastroenterology

## 2021-07-22 NOTE — Telephone Encounter (Signed)
Patient's wife called stating patient is having another diverticulitis flare-up.  He is in a lot of pain with cramping, mucus, and nausea.  He is not running a fever.  This is his third attack since October and she wants to know what he should do.  Please call and advise.  Thank you.

## 2021-07-22 NOTE — Telephone Encounter (Signed)
Patient called back and wanted to know what Dr. Loletha Carrow recommended for a diverticulitis flare-up -- whether he should come in for an appointment or whether or not he should go to the ER and have a CT?  Please call patient and advise.  Thank you.

## 2021-07-22 NOTE — Telephone Encounter (Signed)
Called and spoke with patient in regards to Dr. Loletha Carrow' recommendations. Pt insisted that he feels like he is having another diverticulitis flare. He still has not taken the Dicyclomine because he is at work.  I told him to follow the recommendations for the next few days and call us with an update. He will go to the ER if pain worsens overnight. Pt verbalized understanding and had no other concerns at the end of the call.

## 2021-07-22 NOTE — Telephone Encounter (Signed)
It is not clear if this is truly an episode of diverticulitis with symptoms as described having started last evening.  I agree with a bland diet, avoiding fatty foods and roughage for the next couple of days, take dicyclomine if needed and call us with an update in 2 days.  HD

## 2021-07-22 NOTE — Telephone Encounter (Signed)
Called and spoke with patient. He states that last night he began having lower abdominal pain. He also has been gassy and passing some mucous in his stool. He describes stool as soft, but formed. No fever. No change in diet. He did say that he had sweet potato waffle fries that were really greasy that may have triggered his symptoms. Pt states he had similar symptoms when he had an "attack" before. Pt has not tried the Dicyclomine. I told patient to begin taking 1 Dicyclomine BID for the next couple of days to see if that helps with the cramping. He knows that he needs to go on a bland diet for the next couple of days. He will go on liquids if he is not able to tolerate solid foods. Pt knows to call us back if his symptoms do not improve or worsen. Pt verbalized understanding and had no concerns at the end of the call.

## 2021-07-24 NOTE — Telephone Encounter (Signed)
Patient states he have questions about diverticulitis flare. Says he believes the flare have attached to the bladder and wondered if it was normal

## 2021-07-24 NOTE — Telephone Encounter (Addendum)
Spoke with patient. Pt states he had one episode of pain with urination last night and heard from someone that diverticulitis could attach to the bladder. Pt is concerned he may have this problem. Asked pt if he had been following a bland diet and avoiding fatty foods and roughage and pt stated that he had been on a liquid diet the past 2 days. Pt states that bentyl helps but is concerned his bowel might perforate. Pt asked if he goes to ER will the doctor order a CT scan. Told pt I was unsure what doctor would order. Pt stated he wants a CT scan and not to be put on antibiotics. Pt wanted to know if Dr. Loletha Carrow thought he needed a CT scan. Pt also states that the flare seems to be clearing itself. Pt states he has had one normal formed stool today but experienced pain while going to the bathroom. Pt states he does not have a temp but thought he had chills on Monday. Pt also wanted Dr. Loletha Carrow to look at previous CT scan to see if diverticulitis possibly attached to his bladder. Please advise.

## 2021-07-25 ENCOUNTER — Other Ambulatory Visit: Payer: Self-pay

## 2021-07-25 DIAGNOSIS — R1032 Left lower quadrant pain: Secondary | ICD-10-CM

## 2021-07-25 NOTE — Telephone Encounter (Signed)
Patient is returning your call.  

## 2021-07-25 NOTE — Telephone Encounter (Signed)
Called and spoke with pt. Gave pt Dr. Loletha Carrow' recommendations. Pt verbalized understanding. Pt stated he would call back if pain returned.

## 2021-07-25 NOTE — Telephone Encounter (Signed)
Thank you for the note - I am just seeing it now because I was off work yesterday afternoon.  Please arrange a CT scan abdomen and pelvis with oral and IV contrast, and ask radiology if they can do it today or tomorrow. LLQ pain, rule out diverticulitis.  - HD

## 2021-07-25 NOTE — Telephone Encounter (Signed)
If his pain has resolved, then he does not have diverticulitis and does not need a CT scan.  - HD

## 2021-07-25 NOTE — Telephone Encounter (Signed)
Called and spoke with pt. Pt states his pain is gone today and wanted to know if doctor thought the CT scan would show his diverticulitis with bladder involvement if pain has stopped.

## 2021-07-25 NOTE — Telephone Encounter (Signed)
Placed order for CT abd and pelvis with contrast. Called pt to see when the last time he ate was and to let him know he will need to be NPO for 4 hours. Pt states he last ate at 8 am. Pt also wanted to know how much the CT would cost with his insurance. Called [facility] and they said a CT abd and pelvis is $1495 without insurance. Pt states that if he goes to the ER it will be $500 for the CT scan. Let pt know he could call his insurance company to see how much it would be for a CT abd and pelvis. Pt states he will contact his insurance company and call us back to let us know whether he will go to the ER or go to [facility].

## 2021-08-06 ENCOUNTER — Encounter (HOSPITAL_BASED_OUTPATIENT_CLINIC_OR_DEPARTMENT_OTHER): Payer: Self-pay | Admitting: Urology

## 2021-08-06 ENCOUNTER — Other Ambulatory Visit: Payer: Self-pay

## 2021-08-06 ENCOUNTER — Emergency Department (HOSPITAL_BASED_OUTPATIENT_CLINIC_OR_DEPARTMENT_OTHER)
Admission: EM | Admit: 2021-08-06 | Discharge: 2021-08-07 | Disposition: A | Discharge status: Home / Self Care | Payer: 59 | Attending: Emergency Medicine | Admitting: Emergency Medicine

## 2021-08-06 DIAGNOSIS — R1032 Left lower quadrant pain: Secondary | ICD-10-CM

## 2021-08-06 DIAGNOSIS — R102 Pelvic and perineal pain: Secondary | ICD-10-CM

## 2021-08-06 DIAGNOSIS — R197 Diarrhea, unspecified: Secondary | ICD-10-CM | POA: Diagnosis not present

## 2021-08-06 LAB — COMPREHENSIVE METABOLIC PANEL
ALT: 16 U/L (ref 0–44)
AST: 16 U/L (ref 15–41)
Albumin: 4.1 g/dL (ref 3.5–5.0)
Alkaline Phosphatase: 82 U/L (ref 38–126)
Anion gap: 8 (ref 5–15)
BUN: 10 mg/dL (ref 6–20)
CO2: 28 mmol/L (ref 22–32)
Calcium: 8.9 mg/dL (ref 8.9–10.3)
Chloride: 103 mmol/L (ref 98–111)
Creatinine, Ser: 0.96 mg/dL (ref 0.61–1.24)
GFR, Estimated: >60 mL/min (ref >60)
Glucose, Bld: 106 mg/dL — ABNORMAL HIGH (ref 70–99)
Potassium: 3.7 mmol/L (ref 3.5–5.1)
Sodium: 139 mmol/L (ref 135–145)
Total Bilirubin: 0.9 mg/dL (ref 0.3–1.2)
Total Protein: 6.8 g/dL (ref 6.5–8.1)

## 2021-08-06 LAB — CBC WITH DIFFERENTIAL/PLATELET
Abs Immature Granulocytes: 0.03 10*3/uL (ref 0.00–0.07)
Basophils Absolute: 0 10*3/uL (ref 0.0–0.1)
Basophils Relative: 0 %
Eosinophils Absolute: 0.1 10*3/uL (ref 0.0–0.5)
Eosinophils Relative: 2 %
HCT: 41.6 % (ref 39.0–52.0)
Hemoglobin: 14 g/dL (ref 13.0–17.0)
Immature Granulocytes: 1 %
Lymphocytes Relative: 18 %
Lymphs Abs: 1.1 10*3/uL (ref 0.7–4.0)
MCH: 30.7 pg (ref 26.0–34.0)
MCHC: 33.7 g/dL (ref 30.0–36.0)
MCV: 91.2 fL (ref 80.0–100.0)
Monocytes Absolute: 0.8 10*3/uL (ref 0.1–1.0)
Monocytes Relative: 12 %
Neutro Abs: 4.3 10*3/uL (ref 1.7–7.7)
Neutrophils Relative %: 67 %
Platelets: 214 10*3/uL (ref 150–400)
RBC: 4.56 MIL/uL (ref 4.22–5.81)
RDW: 12.3 % (ref 11.5–15.5)
WBC: 6.3 10*3/uL (ref 4.0–10.5)
nRBC: 0 % (ref 0.0–0.2)

## 2021-08-06 MED ORDER — ONDANSETRON HCL 4 MG/2ML IJ SOLN
4.0000 mg | Freq: Once | INTRAMUSCULAR | Status: AC
  Administered 2021-08-06: 4 mg via INTRAVENOUS
  Filled 2021-08-06: qty 2

## 2021-08-06 MED ORDER — SODIUM CHLORIDE 0.9 % IV BOLUS
1000.0000 mL | Freq: Once | INTRAVENOUS | Status: AC
  Administered 2021-08-06: 1000 mL via INTRAVENOUS

## 2021-08-06 NOTE — ED Triage Notes (Signed)
Diverticulitis flare up x 2-3 days Generalized abdominal pain intermittent Reports diarrhea  Low grade fever noted

## 2021-08-06 NOTE — ED Provider Notes (Signed)
[facility] EMERGENCY DEPT Provider Note   CSN: [id number] Arrival date & time: 08/06/21  1704     History  Chief Complaint  Patient presents with   Abdominal Pain    Raymond Kelly is a 51 y.o. male.  The history is provided by the patient, medical records and the spouse. No language interpreter was used.  Abdominal Pain Pain location:  LLQ and suprapubic Pain quality: aching   Pain radiates to:  Does not radiate Pain severity:  Moderate Onset quality:  Gradual Duration:  3 days Timing:  Constant Progression:  Waxing and waning Chronicity:  Recurrent Relieved by:  Nothing Worsened by:  Palpation and eating Ineffective treatments:  None tried Associated symptoms: chills and diarrhea   Associated symptoms: no anorexia, no chest pain, no constipation, no cough, no dysuria, no fatigue, no fever, no nausea, no shortness of breath and no vomiting       Home Medications Prior to Admission medications   Medication Sig Start Date End Date Taking? Authorizing Provider  dicyclomine (BENTYL) 10 MG capsule TAKE 1 CAPSULE BY MOUTH 2 TIMES DAILY AS NEEDED FOR SPASMS. 07/24/21   Doran Stabler, MD  simethicone (MYLICON) 80 MG chewable tablet Chew 80-160 mg by mouth every 6 (six) hours as needed for flatulence.  09/07/19  [provider]      Allergies    No known allergies    Review of Systems   Review of Systems  Constitutional:  Positive for chills. Negative for diaphoresis, fatigue and fever.  HENT:  Negative for congestion.   Respiratory:  Negative for cough, chest tightness, shortness of breath, wheezing and stridor.   Cardiovascular:  Negative for chest pain, palpitations and leg swelling.  Gastrointestinal:  Positive for abdominal pain and diarrhea. Negative for abdominal distention, anorexia, constipation, nausea and vomiting.  Genitourinary:  Negative for dysuria, enuresis and flank pain.  Musculoskeletal:  Negative for neck pain and neck  stiffness.  Skin:  Negative for rash and wound.  Neurological:  Negative for dizziness, light-headedness and headaches.  Psychiatric/Behavioral:  Negative for agitation.   All other systems reviewed and are negative.  Physical Exam Updated Vital Signs BP 121/85 (BP Location: Right Arm)    Pulse (!) 58    Temp 98.2 F (36.8 C)    Resp 17    Ht 5\' 8"  (1.727 m)    Wt 78.9 kg    SpO2 100%    BMI 26.45 kg/m  Physical Exam Vitals and nursing note reviewed.  Constitutional:      General: He is not in acute distress.    Appearance: He is well-developed. He is not ill-appearing, toxic-appearing or diaphoretic.  HENT:     Head: Normocephalic and atraumatic.     Mouth/Throat:     Mouth: Mucous membranes are moist.  Eyes:     Conjunctiva/sclera: Conjunctivae normal.  Cardiovascular:     Rate and Rhythm: Normal rate and regular rhythm.     Heart sounds: No murmur heard. Pulmonary:     Effort: Pulmonary effort is normal. No respiratory distress.     Breath sounds: Normal breath sounds. No wheezing, rhonchi or rales.  Chest:     Chest wall: No tenderness.  Abdominal:     General: Abdomen is flat. Bowel sounds are normal. There is no distension.     Palpations: Abdomen is soft.     Tenderness: There is abdominal tenderness in the suprapubic area and left lower quadrant. There is no right  CVA tenderness or left CVA tenderness.    Musculoskeletal:        General: No swelling.     Cervical back: Neck supple.  Skin:    General: Skin is warm and dry.     Capillary Refill: Capillary refill takes less than 2 seconds.  Neurological:     General: No focal deficit present.     Mental Status: He is alert.  Psychiatric:        Mood and Affect: Mood normal.    ED Results / Procedures / Treatments   Labs (all labs ordered are listed, but only abnormal results are displayed) Labs Reviewed  COMPREHENSIVE METABOLIC PANEL - Abnormal; Notable for the following components:      Result Value    Glucose, Bld 106 (*)    All other components within normal limits  URINE CULTURE  CBC WITH DIFFERENTIAL/PLATELET  URINALYSIS, ROUTINE W REFLEX MICROSCOPIC    EKG None  Radiology CT ABDOMEN PELVIS W CONTRAST  Result Date: 08/07/2021 CLINICAL DATA:  Left lower quadrant pain, history of diverticulitis, initial encounter EXAM: CT ABDOMEN AND PELVIS WITH CONTRAST TECHNIQUE: Multidetector CT imaging of the abdomen and pelvis was performed using the standard protocol following bolus administration of intravenous contrast. RADIATION DOSE REDUCTION: This exam was performed according to the departmental dose-optimization program which includes automated exposure control, adjustment of the mA and/or kV according to patient size and/or use of iterative reconstruction technique. CONTRAST:  174mL OMNIPAQUE IOHEXOL 300 MG/ML  SOLN COMPARISON:  05/03/2021 FINDINGS: Lower chest: No acute abnormality. Hepatobiliary: No focal liver abnormality is seen. Status post cholecystectomy. No biliary dilatation. Pancreas: Unremarkable. No pancreatic ductal dilatation or surrounding inflammatory changes. Spleen: Normal in size without focal abnormality. Adrenals/Urinary Tract: Adrenal glands are within normal limits. Kidneys demonstrate a normal enhancement pattern bilaterally. No renal calculi or obstructive changes are noted. The bladder is partially distended. Stomach/Bowel: Diverticular change of the colon is noted with wall thickening throughout the sigmoid colon. This is stable from the prior exam and consistent with the underlying diverticular disease. No inflammatory changes to suggest appendicitis are noted at this time. The appendix is within normal limits. Small bowel and stomach are unremarkable. Vascular/Lymphatic: No significant vascular findings are present. No enlarged abdominal or pelvic lymph nodes. Retroaortic left renal vein is noted. Reproductive: Prostate is unremarkable. Other: No abdominal wall hernia or  abnormality. No abdominopelvic ascites. Musculoskeletal: No acute bony abnormality is noted. 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 IMPRESSION: Changes of prior diverticulosis with wall thickening in the sigmoid colon. No acute diverticulitis is seen. No acute abnormality noted. Electronically Signed   By: Inez Catalina M.D.   On: 08/07/2021 00:28    Procedures Procedures    Medications Ordered in ED Medications  sodium chloride 0.9 % bolus 1,000 mL (0 mLs Intravenous Stopped 08/07/21 0113)  ondansetron (ZOFRAN) injection 4 mg (4 mg Intravenous Given 08/06/21 2352)  iohexol (OMNIPAQUE) 300 MG/ML solution 100 mL (100 mLs Intravenous Contrast Given 08/07/21 0014)    ED Course/ Medical Decision Making/ A&P                           Medical Decision Making Amount and/or Complexity of Data Reviewed Labs: ordered. Radiology: ordered.  Risk Prescription drug management.   Raymond Kelly is a 51 y.o. male with a past medical history significant for previous kidney stones, previous cholecystectomy,  and recurrent diverticulitis who presents with 3 days of recurrent suprapubic and left lower quadrant abdominal pain with some chills and diarrhea.  According to patient, the symptoms are remarkably similar to when he had diverticulitis before and his recurrent symptoms he wanted to make sure he does not have some other complication.  He denies any congestion, cough, chest pain, shortness of breath.  Denies any current vomiting but has occasional nausea.  No urinary changes otherwise.  He denies any new trauma.  Denies any skin changes.  Reports the pain is moderate to severe.  Patient was offered some pain medicine for discomfort but he did not want that at this time.  On exam, he does have tenderness in the suprapubic and left lower quadrant.  Denies any groin or testicle pain so this exam was initially deferred.  Back and flanks nontender.   Exam otherwise unremarkable.  Had a shared decision-making conversation with patient and wife who is a Marine scientist and we agreed to get a repeat CT scan.  Of note he was told that he needed a CT scan but when he had no symptoms recently he called off the CT.  Will get CT to rule out recurrent diverticulitis with perforation, obstruction, or other complication as this is also in line with what the GI team was requesting as an outpatient.  Chart review shows that he has had CTA recently that did not show any evidence of vascular compromise or other acute abnormality.  Do not feel CTA is needed as I have low suspicion for thrombotic or dissection type symptoms.  Patient had screening labs are reassuring.  Urinalysis does not show infection.  CT scan was completed showing no acute abnormality.  There was thickening of the sigmoid colon which seems consistent with prior.  Clinically I do suspect patient may have some inflammatory process going on in his colon however does not appear to show diverticulitis or acute infection and we had a shared decision-making conversation about steroids versus antibiotics versus follow-up with his GI team and they elect to follow-up with GI team closely to discuss further management.  Family does say the patient has a history of some autoimmune troubles and are considering if that is being related.  Will defer to GI team for further management.  Patient agreed with plan of care and follow-up instructions and return precautions and patient was discharged in good condition.             Final Clinical Impression(s) / ED Diagnoses Final diagnoses:  Left lower quadrant abdominal pain  Suprapubic pain, acute    Rx / DC Orders ED Discharge Orders     None       Clinical Impression: 1. Left lower quadrant abdominal pain   2. Suprapubic pain, acute     Disposition: Discharge  Condition: Good  I have discussed the results, Dx and Tx plan with the pt(& family if  present). He/she/they expressed understanding and agree(s) with the plan. Discharge instructions discussed at great length. Strict return precautions discussed and pt &/or family have verbalized understanding of the instructions. No further questions at time of discharge.    Discharge Medication List as of 08/07/2021  1:01 AM      Follow Up: your GI team and PCP teams     [facility] [place] [id number] [phone]        Cande Mastropietro, Gwenyth Allegra, MD 08/07/21 [phone]

## 2021-08-06 NOTE — Telephone Encounter (Signed)
Spoke with pt and pt states pain has returned and he feels like he needs a CT scan. Let pt know that [facility]'s soonest appt is tomorrow at 3 pm. Pt states he doesn't feel like he can wait that long and stated he would go to ED.

## 2021-08-06 NOTE — Telephone Encounter (Signed)
Patient called in states he is still experiencing the pain. States he would like to proceed with CT or something to know what is going on. States he may just go to ED

## 2021-08-07 ENCOUNTER — Emergency Department (HOSPITAL_BASED_OUTPATIENT_CLINIC_OR_DEPARTMENT_OTHER): Payer: 59

## 2021-08-07 LAB — URINALYSIS, ROUTINE W REFLEX MICROSCOPIC
Bilirubin Urine: NEGATIVE
Glucose, UA: NEGATIVE mg/dL
Hgb urine dipstick: NEGATIVE
Ketones, ur: NEGATIVE mg/dL
Leukocytes,Ua: NEGATIVE
Nitrite: NEGATIVE
Protein, ur: NEGATIVE mg/dL
Specific Gravity, Urine: 1.019 (ref 1.005–1.030)
pH: 5.5 (ref 5.0–8.0)

## 2021-08-07 IMAGING — CT CT ABD-PELV W/ CM
2 of 5 series · 16 of 46 positions shown, 18 images · IV contrast (APPLIED)
Comparison: [DATE]

CLINICAL DATA: Left lower quadrant pain, history of diverticulitis,
initial encounter

EXAM:
CT ABDOMEN AND PELVIS WITH CONTRAST
TECHNIQUE: Multidetector CT imaging of the abdomen and pelvis was performed
using the standard protocol following bolus administration of
intravenous contrast.

[Series 2: abd pel w · axial · 0.82mm/px · z∈[+968,+1393]mm · 13 of 95 slices shown, 15 images]
[im 5/95  soft-tissue]
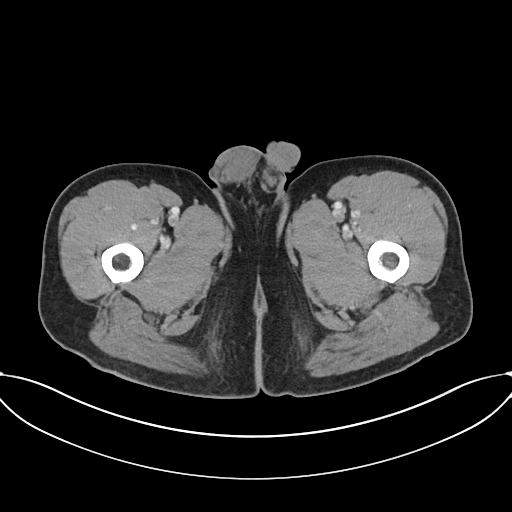
[im 5/95  bone]
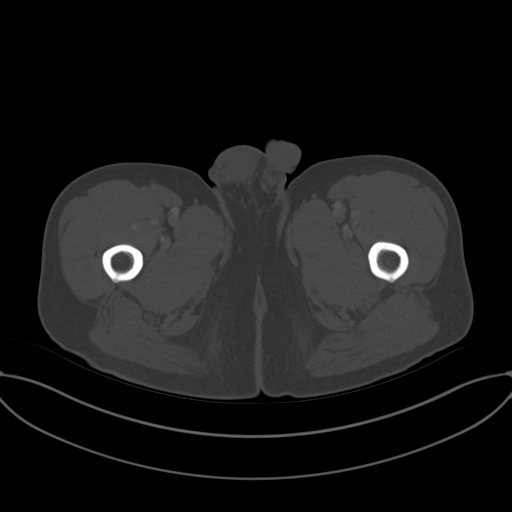
[im 15/95  soft-tissue]
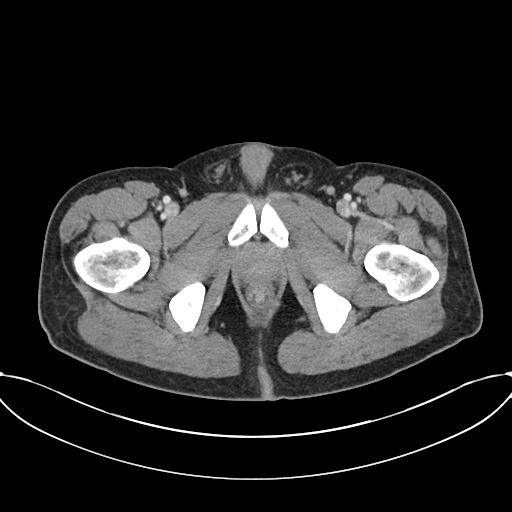
[im 20/95  soft-tissue]
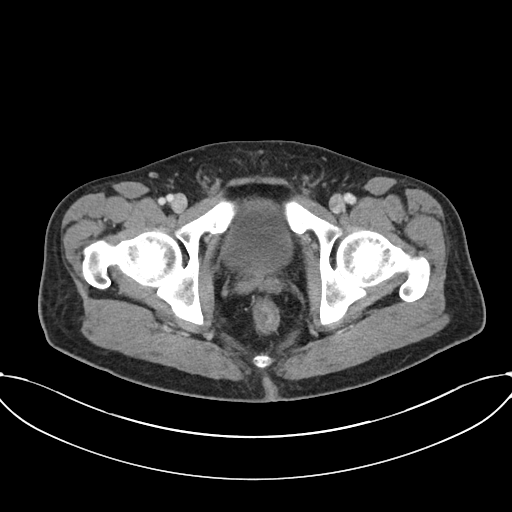
[im 25/95  soft-tissue]
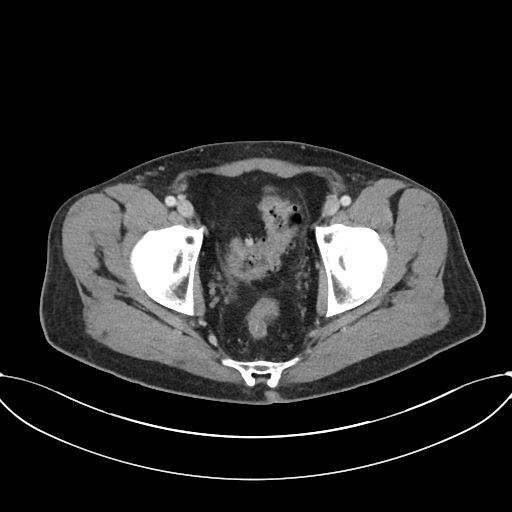
[im 35/95  soft-tissue]
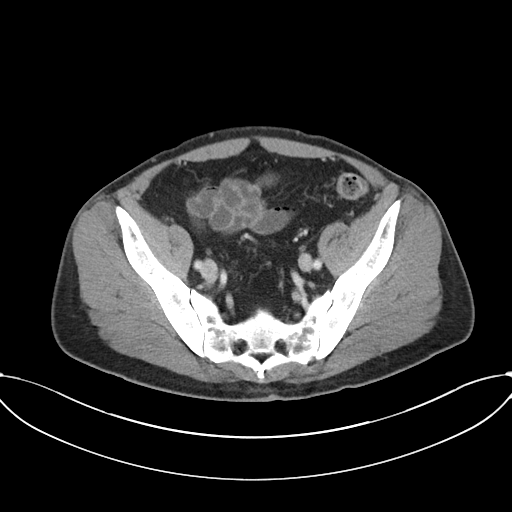
[im 40/95  soft-tissue]
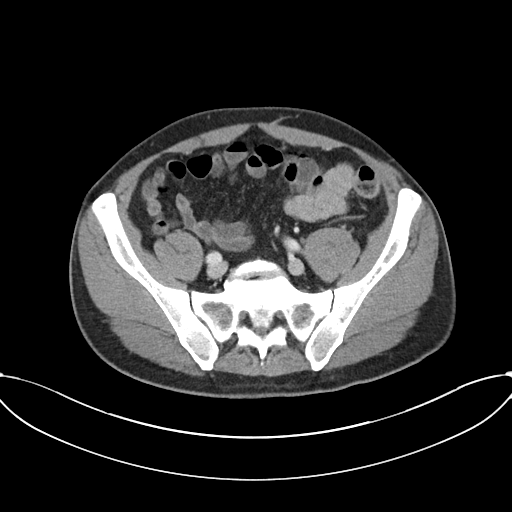
[im 50/95  soft-tissue]
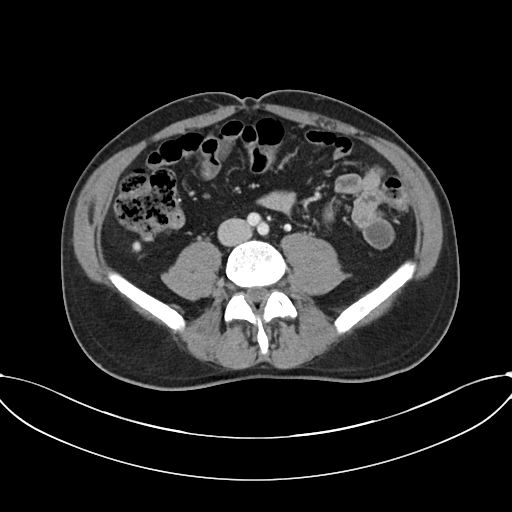
[im 55/95  soft-tissue]
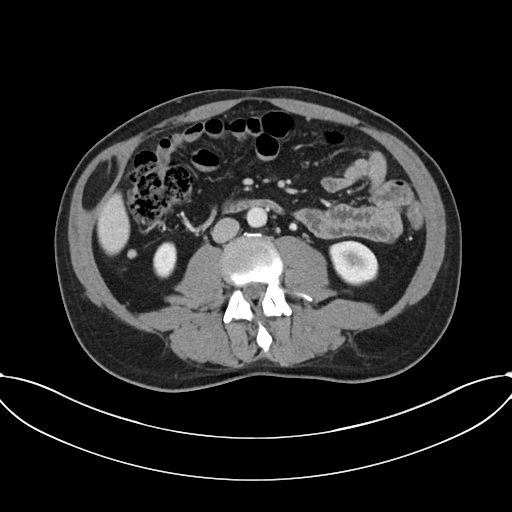
[im 60/95  soft-tissue]
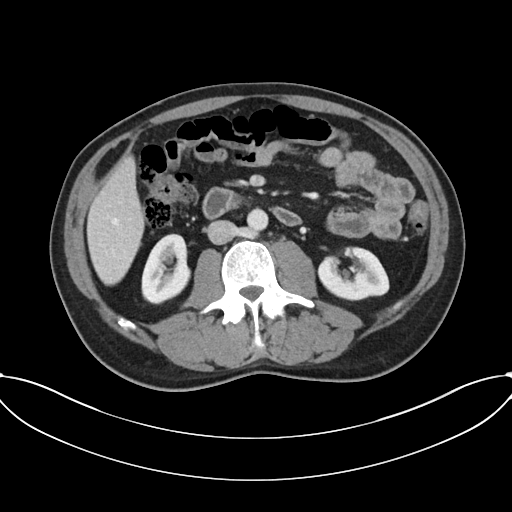
[im 60/95  bone]
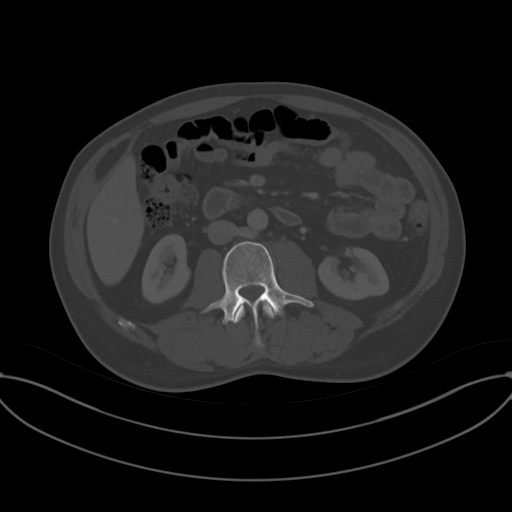
[im 70/95  soft-tissue]
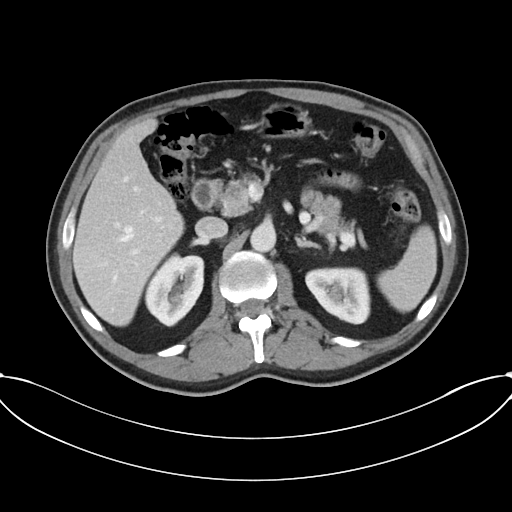
[im 75/95  soft-tissue]
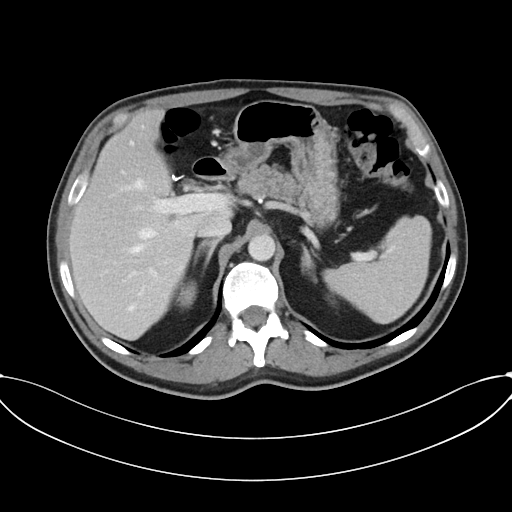
[im 80/95  soft-tissue]
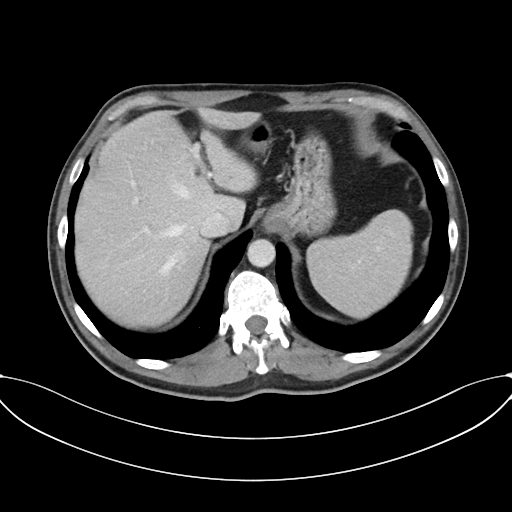
[im 90/95  soft-tissue]
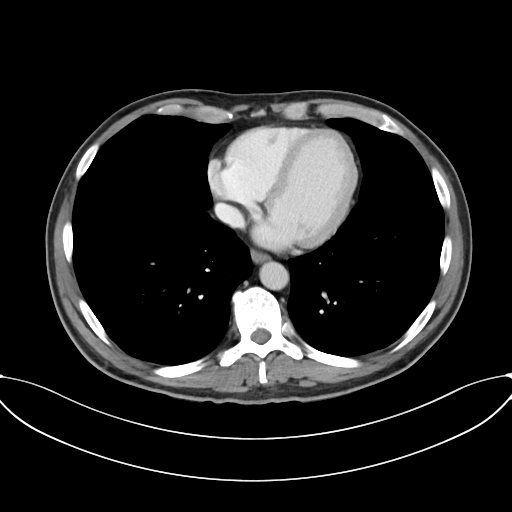

[Series 5: coronal · coronal · 0.73mm/px · 3 of 93 slices shown]
[im 31/93  soft-tissue]
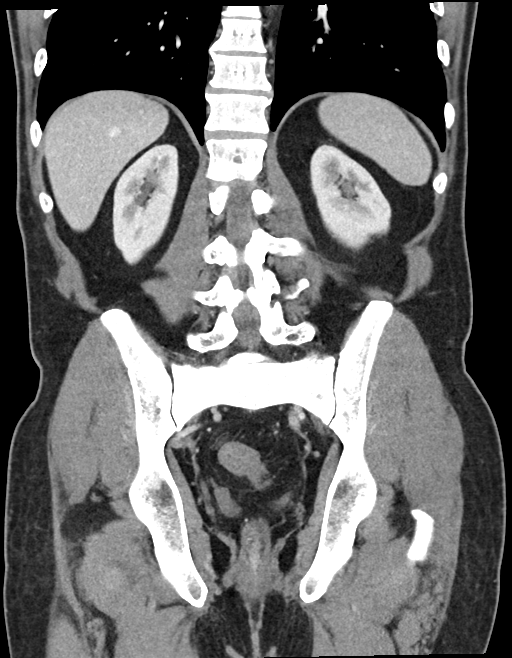
[im 41/93  soft-tissue]
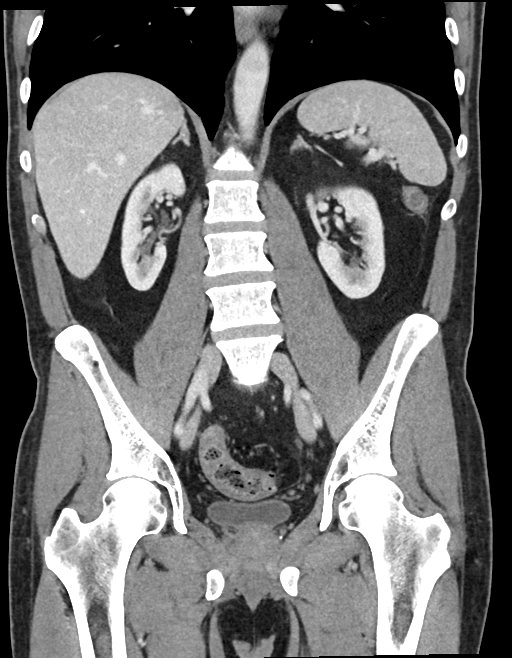
[im 52/93  soft-tissue]
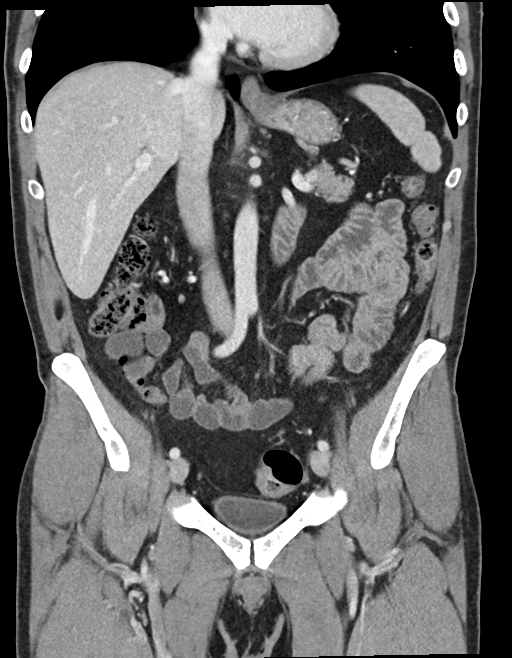

[16 of 46 positions shown; findings below may reference images not displayed]

RADIATION DOSE REDUCTION: This exam was performed according to the
departmental dose-optimization program which includes automated
exposure control, adjustment of the mA and/or kV according to
patient size and/or use of iterative reconstruction technique.

CONTRAST:  100mL OMNIPAQUE IOHEXOL 300 MG/ML  SOLN
FINDINGS: Lower chest: No acute abnormality.

Hepatobiliary: No focal liver abnormality is seen. Status post
cholecystectomy. No biliary dilatation.

Pancreas: Unremarkable. No pancreatic ductal dilatation or
surrounding inflammatory changes.

Spleen: Normal in size without focal abnormality.

Adrenals/Urinary Tract: Adrenal glands are within normal limits.
Kidneys demonstrate a normal enhancement pattern bilaterally. No
renal calculi or obstructive changes are noted. The bladder is
partially distended.

Stomach/Bowel: Diverticular change of the colon is noted with wall
thickening throughout the sigmoid colon. This is stable from the
prior exam and consistent with the underlying diverticular disease.
No inflammatory changes to suggest appendicitis are noted at this
time. The appendix is within normal limits. Small bowel and stomach
are unremarkable.

Vascular/Lymphatic: No significant vascular findings are present. No
enlarged abdominal or pelvic lymph nodes. Retroaortic left renal
vein is noted.

Reproductive: Prostate is unremarkable.

Other: No abdominal wall hernia or abnormality. No abdominopelvic
ascites.

Musculoskeletal: No acute bony abnormality is noted. There is a
intramuscular lipoma in the right lateral abdominal wall similar to
that seen on prior exam.
IMPRESSION: Changes of prior diverticulosis with wall thickening in the sigmoid
colon. No acute diverticulitis is seen.

No acute abnormality noted.

## 2021-08-07 MED ORDER — IOHEXOL 300 MG/ML  SOLN
100.0000 mL | Freq: Once | INTRAMUSCULAR | Status: AC | PRN
  Administered 2021-08-07: 100 mL via INTRAVENOUS

## 2021-08-07 NOTE — Telephone Encounter (Signed)
Inbound call from patient states he would like a call back to discuss next course of treatment

## 2021-08-07 NOTE — Discharge Instructions (Addendum)
Your work-up today did not show any evidence of acute abnormality tonight.  It does show similarly thickened sigmoid colon likely related to all the diverticulitis you have been battling.  No findings to suggest need for surgical intervention, antibiotics, or admission tonight however I do recommend calling your GI team tomorrow to discuss close follow-up and recommendations.  If any symptoms change or worsen, please return to the nearest emergency department.  Please rest and stay hydrated.

## 2021-08-08 ENCOUNTER — Telehealth: Payer: Self-pay | Admitting: Gastroenterology

## 2021-08-08 ENCOUNTER — Other Ambulatory Visit: Payer: Self-pay

## 2021-08-08 LAB — URINE CULTURE: Culture: NO GROWTH

## 2021-08-08 NOTE — Telephone Encounter (Signed)
Left message for pt to call back  °

## 2021-08-08 NOTE — Telephone Encounter (Signed)
Thank you for the note -I was out of the office when he called earlier this week, so I reviewed the lab and CT scan findings from the ED visit.  Normal white blood cell count, CT images did not show obvious diverticulitis.  (I looked at the scan)  Sometimes low-grade diverticulitis may not be evident on CT scan.  Since he has recurrent pain and symptoms that he feels are reminiscent of his diverticulitis episode a few months ago, I will treat him with antibiotics and I would like to see him in the office next week.  Ciprofloxacin 500 mg 1 tablet twice daily for 7 days Dispense 14 tablets, refill 0  Metronidazole 500 mg 1 tablet 2 times daily for 10 days Dispense 20 tablets, refill 0  I have clinic openings on February 9 and 10, please contact him with these instructions and arrange the office visit.  HD

## 2021-08-08 NOTE — Telephone Encounter (Signed)
Spoke with pt. Let pt know Dr. Loletha Carrow' recommendations. Scheduled pt for follow up visit with Dr. Loletha Carrow on 08/15/21 at 3:40 pm. Pt concerned about starting antibiotics again, especially cipro due to things he has read. Pt states he has no pain today, just numbness on left side. Pt states the pain in mid lower abdomen and numbness in left side occur about every 2 weeks for a full 1-2 days then goes away. Pt wants to know why pain keeps returning. Pt wanted to see if doctor thought if he needed a colonoscopy to find source of recurrent pain.

## 2021-08-08 NOTE — Telephone Encounter (Signed)
That's fine.  Don't take the antibiotics.  I will see him next week.  - HD

## 2021-08-08 NOTE — Telephone Encounter (Signed)
Patient returned your call.  Please call back.  Thank you. 

## 2021-08-08 NOTE — Telephone Encounter (Signed)
Spoke with pt. See 07/22/21 telephone note.

## 2021-08-08 NOTE — Telephone Encounter (Signed)
Let pt know doctor's message. Pt then asked if he were to have a flare between now and office appointment would Dr. Loletha Carrow send in antibiotics for him. Told pt he could contact the office if this happens.

## 2021-08-08 NOTE — Telephone Encounter (Signed)
Spoke with pt. Pt states he is still having abd pain and that CT scan was inconclusive. Looked at CT scan and CT scan states no acute diverticulitis is seen. Pt wanted to know what to do about abd pain. Asked pt if he had been taking dicyclomine and pt states he takes it occasionally. Let pt know prescription was written for 2x per day for spasms so if he is experiencing abd pain he should take medication when experiencing pain.   Let pt know that on office note from 07/09/21 Dr. Loletha Carrow recommended pt take dicyclomine as needed and that pain will most likely improve with time. Pt verbalized understanding but wanted Dr. Loletha Carrow to look at CT scan and see if doctor had any further recommendations.

## 2021-08-11 ENCOUNTER — Other Ambulatory Visit: Payer: Self-pay | Admitting: Gastroenterology

## 2021-08-15 ENCOUNTER — Encounter: Payer: Self-pay | Admitting: Gastroenterology

## 2021-08-15 ENCOUNTER — Ambulatory Visit (INDEPENDENT_AMBULATORY_CARE_PROVIDER_SITE_OTHER): Payer: 59 | Admitting: Gastroenterology

## 2021-08-15 VITALS — BP 102/70 | HR 64 | Ht 68.0 in | Wt 178.5 lb

## 2021-08-15 DIAGNOSIS — R1032 Left lower quadrant pain: Secondary | ICD-10-CM | POA: Diagnosis not present

## 2021-08-15 DIAGNOSIS — K5732 Diverticulitis of large intestine without perforation or abscess without bleeding: Secondary | ICD-10-CM | POA: Diagnosis not present

## 2021-08-15 NOTE — Progress Notes (Signed)
[facility] progress note:  History: Raymond Kelly 08/15/2021  Chief complaint: Lower abdominal pain   Subjective  HPI: I last saw Raymond Kelly in the office January 3 after an episode of diverticulitis.  He was having some intermittent crampy lower abdominal pain and flatulence.  I prescribed some dicyclomine, as it did not seem he was having ongoing diverticulitis. He contacted our office twice since then with episodic left lower quadrant pain and concerns that he might have diverticulitis.  After treating several phone calls, he was offered a CT scan and then it was canceled since he reported his pain had stopped.  On second call he then decided to go back to the ED where his work-up was unremarkable with no apparent diverticulitis on CT scan.  There was some sigmoid wall thickening as previously seen and consistent with his known diverticulosis.  Out of concern for possible low-grade diverticulitis, I offered a course of metronidazole and ciprofloxacin, then he called back saying he decided not to take it since his pain had again resolved.  He then gave the story of the pain occurring for a day or 2 at a time intermittently since I last saw him.  Raymond Kelly is here for evaluation of his abdominal pain, and his wife was on speaker phone for the visit as well.  He has episodic lower abdominal pain, usually more toward the left side with a feeling of need for BM, though sometimes feels like it is difficult to go.  Pain is relieved after BM.  He has taken the dicyclomine a few times, hard to tell if it helps.  He is taking a probiotic and fiber to help with regularity.  He has not had a fever chills night sweats or rectal bleeding.  He has been very cautious about his diet because of the diverticulitis, his wife says he has avoided many foods and as a result lost some weight, which is of concern to her.   ROS:  Review of Systems He denies chest pain dyspnea or dysuria  "Brain fog"  since COVID infection last year Generalized fatigue Insomnia Urinary frequency Anxiety  Past Medical History: Past Medical History:  Diagnosis Date   Diverticulitis    Family history of adverse reaction to anesthesia    mother slow to awaken   History of kidney stones 2007     Past Surgical History: Past Surgical History:  Procedure Laterality Date   CHOLECYSTECTOMY N/A 02/01/2016   Procedure: LAPAROSCOPIC CHOLECYSTECTOMY WITH INTRAOPERATIVE CHOLANGIOGRAM;  Surgeon: Coralie Keens, MD;  Location: [facility];  Service: General;  Laterality: N/A;   COLONOSCOPY     cyst removed     between his front teeth and cyst removed from abdomal area   ESOPHAGOGASTRODUODENOSCOPY (EGD) WITH PROPOFOL N/A 03/06/2016   Procedure: ESOPHAGOGASTRODUODENOSCOPY (EGD) WITH PROPOFOL;  Surgeon: Doran Stabler, MD;  Location: [facility];  Service: Gastroenterology;  Laterality: N/A;  with stent removal.   GASTROINTESTINAL STENT REMOVAL N/A 03/06/2016   Procedure: GASTROINTESTINAL STENT REMOVAL;  Surgeon: Doran Stabler, MD;  Location: [facility];  Service: Gastroenterology;  Laterality: N/A;   TONSILLECTOMY     ULNAR NERVE TRANSPOSITION       Family History: Family History  Problem Relation Age of Onset   Ulcerative colitis Mother    Irritable bowel syndrome Mother    Hypertension Mother    Arthritis Father    ALS Father    Neuropathy Father    Hypertension Father  Other Father        "open stomach"   Diabetes Brother    Pancreatic cancer Maternal Grandfather    Juvenile Diabetes Niece    Colon cancer Neg Hx    Rectal cancer Neg Hx    Stomach cancer Neg Hx    Esophageal cancer Neg Hx     Social History: Social History   Socioeconomic History   Marital status: Married    Spouse name: Not on file   Number of children: 2   Years of education: Not on file   Highest education level: Not on file  Occupational History   Occupation: sales  Tobacco Use   Smoking status: Never    Smokeless tobacco: Never  Vaping Use   Vaping Use: Never used  Substance and Sexual Activity   Alcohol use: Not Currently    Comment: social at times   Drug use: No   Sexual activity: Not on file  Other Topics Concern   Not on file  Social History Narrative   Not on file   Social Determinants of Health   Financial Resource Strain: Not on file  Food Insecurity: Not on file  Transportation Needs: Not on file  Physical Activity: Not on file  Stress: Not on file  Social Connections: Not on file    Allergies: Allergies  Allergen Reactions   No Known Allergies     Outpatient Meds: Current Outpatient Medications  Medication Sig Dispense Refill   AMBULATORY NON FORMULARY MEDICATION Medication Name: UltraFlora Balance Probiotic daily     methylcellulose (CITRUCEL) oral powder Take 1 packet by mouth daily.     dicyclomine (BENTYL) 10 MG capsule TAKE 1 CAPSULE BY MOUTH 2 TIMES DAILY AS NEEDED FOR SPASMS. 180 capsule 0   No current facility-administered medications for this visit.      ___________________________________________________________________ Objective   Exam:  BP 102/70    Pulse 64    Resp (!) 98    Ht 5\' 8"  (1.727 m)    Wt 178 lb 8 oz (81 kg)    BMI 27.14 kg/m  Wt Readings from Last 3 Encounters:  08/15/21 178 lb 8 oz (81 kg)  08/06/21 173 lb 15.1 oz (78.9 kg)  07/09/21 174 lb (78.9 kg)    General: He looks well Eyes: sclera anicteric, no redness ENT: oral mucosa moist without lesions, no cervical or supraclavicular lymphadenopathy CV: RRR without murmur, S1/S2, no JVD, no peripheral edema Resp: clear to auscultation bilaterally, normal RR and effort noted GI: soft, LLQ palpation caused discomfort left upper quadrant, with active bowel sounds. No guarding or palpable organomegaly noted. Skin; warm and dry, no rash or jaundice noted Neuro: awake, alert and oriented x 3. Normal gross motor function and fluent speech  Labs:  CBC Latest Ref Rng & Units  08/06/2021 06/28/2021 06/02/2021  WBC 4.0 - 10.5 K/uL 6.3 7.3 7.4  Hemoglobin 13.0 - 17.0 g/dL 14.0 14.9 15.5  Hematocrit 39.0 - 52.0 % 41.6 43.9 45.2  Platelets 150 - 400 K/uL 214 242.0 229   CMP Latest Ref Rng & Units 08/06/2021 06/28/2021 06/02/2021  Glucose 70 - 99 mg/dL 106(H) 104(H) 122(H)  BUN 6 - 20 mg/dL 10 11 15   Creatinine 0.61 - 1.24 mg/dL 0.96 1.03 0.88  Sodium 135 - 145 mmol/L 139 140 138  Potassium 3.5 - 5.1 mmol/L 3.7 3.7 4.1  Chloride 98 - 111 mmol/L 103 103 105  CO2 22 - 32 mmol/L 28 30 26   Calcium 8.9 - 10.3  mg/dL 8.9 9.1 9.0  Total Protein 6.5 - 8.1 g/dL 6.8 6.9 -  Total Bilirubin 0.3 - 1.2 mg/dL 0.9 1.4(H) -  Alkaline Phos 38 - 126 U/L 82 69 -  AST 15 - 41 U/L 16 58(H) -  ALT 0 - 44 U/L 16 87(H) -     Radiologic Studies:  CLINICAL DATA:  Left lower quadrant pain, history of diverticulitis, initial encounter   EXAM: CT ABDOMEN AND PELVIS WITH CONTRAST   TECHNIQUE: Multidetector CT imaging of the abdomen and pelvis was performed using the standard protocol following bolus administration of intravenous contrast.   RADIATION DOSE REDUCTION: This exam was performed according to the departmental dose-optimization program which includes automated exposure control, adjustment of the mA and/or kV according to patient size and/or use of iterative reconstruction technique.   CONTRAST:  182mL OMNIPAQUE IOHEXOL 300 MG/ML  SOLN   COMPARISON:  05/03/2021   FINDINGS: Lower chest: No acute abnormality.   Hepatobiliary: No focal liver abnormality is seen. Status post cholecystectomy. No biliary dilatation.   Pancreas: Unremarkable. No pancreatic ductal dilatation or surrounding inflammatory changes.   Spleen: Normal in size without focal abnormality.   Adrenals/Urinary Tract: Adrenal glands are within normal limits. Kidneys demonstrate a normal enhancement pattern bilaterally. No renal calculi or obstructive changes are noted. The bladder is partially  distended.   Stomach/Bowel: Diverticular change of the colon is noted with wall thickening throughout the sigmoid colon. This is stable from the prior exam and consistent with the underlying diverticular disease. No inflammatory changes to suggest appendicitis are noted at this time. The appendix is within normal limits. Small bowel and stomach are unremarkable.   Vascular/Lymphatic: No significant vascular findings are present. No enlarged abdominal or pelvic lymph nodes. Retroaortic left renal vein is noted.   Reproductive: Prostate is unremarkable.   Other: No abdominal wall hernia or abnormality. No abdominopelvic ascites.   Musculoskeletal: No acute bony abnormality is noted. 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   IMPRESSION: Changes of prior diverticulosis with wall thickening in the sigmoid colon. No acute diverticulitis is seen.   No acute abnormality noted.     Electronically Signed   By: Inez Catalina M.D.   On: 08/07/2021 00:28  (Images personally reviewed -HD)  Assessment: Encounter Diagnoses  Name Primary?   Left lower quadrant abdominal pain Yes   Diverticulitis of colon     I think Raymond Kelly has IBS, perhaps triggered by diverticulitis several months ago.  It also seems like some anxiety is contributing to it right now. He and his wife are asking about the reported thickened bowel wall on the recent CT scan, I explained how this is a very common finding in the sigmoid colon and does not appear to represent diverticulitis.  Patients can sometimes have segmental colitis associated with their reticulitis ("SCAD") that can cause episodic pain, though often has associated diarrhea.  Given those concerns, we mutually decided to have him undergo a repeat colonoscopy.  He was agreeable after discussion of procedure and risks  The benefits and risks of the planned procedure were described in detail with  the patient or (when appropriate) their health care proxy.  Risks were outlined as including, but not limited to, bleeding, infection, perforation, adverse medication reaction leading to cardiac or pulmonary decompensation, pancreatitis (if ERCP).  The limitation of incomplete mucosal visualization was also discussed.  No guarantees or warranties were given.  As needed  use of dicyclomine  Thank you for the courtesy of this consult.  Please call me with any questions or concerns.  [name]  CC: Referring provider noted above

## 2021-08-15 NOTE — Patient Instructions (Signed)
If you are age 53 or older, your body mass index should be between 23-30. Your Body mass index is 27.14 kg/m. If this is out of the aforementioned range listed, please consider follow up with your Primary Care Provider.  If you are age 64 or younger, your body mass index should be between 19-25. Your Body mass index is 27.14 kg/m. If this is out of the aformentioned range listed, please consider follow up with your Primary Care Provider.   ________________________________________________________  The [facility] providers would like to encourage you to use [facility] to communicate with providers for non-urgent requests or questions.  Due to long hold times on the telephone, sending your provider a message by [facility] may be a faster and more efficient way to get a response.  Please allow 48 business hours for a response.  Please remember that this is for non-urgent requests.  _______________________________________________________  Dennis Bast have been scheduled for a colonoscopy. Please follow written instructions given to you at your visit today.  Please pick up your prep supplies at the pharmacy within the next 1-3 days. If you use inhalers (even only as needed), please bring them with you on the day of your procedure.  Due to recent changes in healthcare laws, you may see the results of your imaging and laboratory studies on MyChart before your provider has had a chance to review them.  We understand that in some cases there may be results that are confusing or concerning to you. Not all laboratory results come back in the same time frame and the provider may be waiting for multiple results in order to interpret others.  Please give [place] 48 hours in order for your provider to thoroughly review all the results before contacting the office for clarification of your results.   It was a pleasure to see you today!  Thank you for trusting me with your gastrointestinal care!

## 2021-08-16 ENCOUNTER — Other Ambulatory Visit: Payer: Self-pay

## 2021-08-16 ENCOUNTER — Telehealth: Payer: Self-pay | Admitting: Gastroenterology

## 2021-08-16 DIAGNOSIS — R1032 Left lower quadrant pain: Secondary | ICD-10-CM

## 2021-08-16 MED ORDER — METRONIDAZOLE 500 MG PO TABS: 500.0000 mg | ORAL_TABLET | Freq: Two times a day (BID) | ORAL | 0 refills | Status: DC

## 2021-08-16 NOTE — Telephone Encounter (Signed)
Called pt. Pt stated he couldn't talk right now because he had to use the bathroom. Pt stated he would call back.

## 2021-08-16 NOTE — Telephone Encounter (Signed)
Inbound call from patient stating they are experiencing severe abdominal pain and diarrhea.  Please advise.

## 2021-08-16 NOTE — Telephone Encounter (Signed)
Prescription sent to pt's preferred pharmacy. Pt verbalized understanding and had no other concerns at end of call.

## 2021-08-16 NOTE — Telephone Encounter (Signed)
Not clear if this is diverticulitis, but I will give him a course of antibiotics.  No ciprofloxacin   Metronidazole 500 mg tablet, 1 tablet twice daily x10 days Dispense 20 tablets, 0 refill  - HD

## 2021-08-16 NOTE — Telephone Encounter (Signed)
Spoke with pt. Pt reports that he is experiencing severe stabbing abd pain that started last night. He is also experiencing diarrhea that started at 1 pm today. Pt reports he had a wave of nausea that happened 5 min ago that he feels like may be related to the pain. Pt requesting antibiotics be sent to his pharmacy. Pt also wanted to know if he would be able to use another antibiotic other than cipro. Pt states if Dr. Loletha Carrow recommends cipro he would take it, he just would prefer another antibiotic.

## 2021-09-06 ENCOUNTER — Other Ambulatory Visit: Payer: Self-pay

## 2021-09-06 ENCOUNTER — Telehealth: Payer: Self-pay | Admitting: Gastroenterology

## 2021-09-06 DIAGNOSIS — R1032 Left lower quadrant pain: Secondary | ICD-10-CM

## 2021-09-06 NOTE — Telephone Encounter (Signed)
Called and spoke with patient regarding cancellation of [facility] procedure and reasoning behind it. Pt wishes to proceed with colonoscopy on 09/30/21 at [facility]. He is aware that he will need to arrive at [facility] by 9:30 am with a care partner and his colonoscopy will start at 11 am. Pt is aware that I will send his new instructions via my chart. Pt will call if anything changes with his availability. Pt verbalized understanding of all information and had no concerns at the end of the call.  ? ?Secure staff message sent to [facility] about location change. ?

## 2021-09-06 NOTE — Telephone Encounter (Signed)
Raymond Kelly, ? ?This patient is scheduled for colonoscopy with me in the [facility] on March 9. ? ?(For documentation) ?I received a staff message from Rosalyn Gess of anesthesia today: ? ?"Raymond Angst, CRNA  Danis, Kirke Corin, MD ?Dr. Loletha Carrow,  ? ?This pt is scheduled with you on 09/12/2021.  He is a diagnosed difficult airway as documented below.  His procedure will need to be done at the hospital.  ? ?Thanks,  ? ?Raymond Kelly  ? ? ? ?Last edited 02/04/16 1958 by Cynda Familia, CRNA  ?Date of Service 02/04/16 1735  ?Status: Addendum  ? ?   ? ? ?                                  Anesthesia Evaluation  ?Patient identified by MRN, date of birth, ID band  ?Patient awake  ?   ?   ?   ?Reviewed:  ?Allergy & Precautions, NPO status , Patient's Chart, lab work & pertinent test results  ?   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 " ? ? ?_______________________ ? ? ?Although Raymond Kelly had a colonoscopy in the [facility] in December 2021, our anesthesia service's chart review indicates he has a history of difficult intubation during a 2017 surgery, and thus is considered a high risk airway and cannot have his endoscopic procedure in the [facility]. ? ?As such, his colonoscopy needs to be rescheduled to one of my [facility] long procedure blocks. ? ?My next available slots are on March 27 and March 28.  Please contact this patient today, give him this information and reschedule his colonoscopy. ? ?If the patient would like more information about that from anesthesia, I am sure Raymond Angst, CRNA would be happy to call Raymond Kelly. ? ? ?-Madelon Lips, MD ? ? ?

## 2021-09-12 ENCOUNTER — Ambulatory Visit: Payer: 59

## 2021-09-12 ENCOUNTER — Encounter: Payer: 59 | Admitting: Gastroenterology

## 2021-09-12 ENCOUNTER — Other Ambulatory Visit: Payer: Self-pay

## 2021-09-12 DIAGNOSIS — G4733 Obstructive sleep apnea (adult) (pediatric): Secondary | ICD-10-CM

## 2021-09-12 DIAGNOSIS — R0683 Snoring: Secondary | ICD-10-CM

## 2021-09-18 DIAGNOSIS — G4733 Obstructive sleep apnea (adult) (pediatric): Secondary | ICD-10-CM | POA: Diagnosis not present

## 2021-09-19 ENCOUNTER — Encounter (HOSPITAL_COMMUNITY): Payer: Self-pay | Admitting: Gastroenterology

## 2021-09-19 ENCOUNTER — Telehealth: Payer: Self-pay

## 2021-09-19 NOTE — Telephone Encounter (Signed)
Attempted to reach pt to confirm his colonoscopy appt at [facility] on 09/30/21 at 11 am,. Pt will need to arrive at 9:30 am with care partner. Need to confirm that pt has his instructions and his prep. I left pt a vm to call back to confirm appt. ?

## 2021-09-20 NOTE — Telephone Encounter (Signed)
Called and spoke with patient to confirm his appt. Pt states that he has his prep, he knows that his instructions are available in his my chart under the "Letter" tab. Pt is aware that he will need to arrive at [facility] by 9:30 am with a care partner. Pt verbalized understanding and had no concerns at the end of the call. ?

## 2021-09-25 ENCOUNTER — Telehealth: Payer: Self-pay | Admitting: Gastroenterology

## 2021-09-25 NOTE — Telephone Encounter (Signed)
Patient called to confirm the time of his upcoming hospital procedure.  I told him everything I saw was for his arrival at 9:30 for an 11 procedure.  He said someone else called him and told him it had been moved to the afternoon.  Do you know anything about this?  Please call patient and advise.  Thank you. ?

## 2021-09-25 NOTE — Telephone Encounter (Signed)
Returned call to pt. I left him a detailed vm confirming that his appt is at [facility] on 09/30/21 at 11 am and he will need to arrive at 9:30 am with a care partner. I advised pt to call back with any questions. Not sure who called him before. ?

## 2021-09-30 ENCOUNTER — Ambulatory Visit (HOSPITAL_COMMUNITY): Payer: 59 | Admitting: Anesthesiology

## 2021-09-30 ENCOUNTER — Other Ambulatory Visit: Payer: Self-pay

## 2021-09-30 ENCOUNTER — Ambulatory Visit (HOSPITAL_COMMUNITY)
Admission: RE | Admit: 2021-09-30 | Discharge: 2021-09-30 | Disposition: A | Discharge status: Home / Self Care | Payer: 59 | Source: Ambulatory Visit | Attending: Gastroenterology | Admitting: Gastroenterology

## 2021-09-30 ENCOUNTER — Encounter (HOSPITAL_COMMUNITY)
Admission: RE | Disposition: A | Discharge status: Home / Self Care | Payer: Self-pay | Source: Ambulatory Visit | Attending: Gastroenterology

## 2021-09-30 ENCOUNTER — Encounter (HOSPITAL_COMMUNITY): Payer: Self-pay | Admitting: Gastroenterology

## 2021-09-30 ENCOUNTER — Ambulatory Visit (HOSPITAL_BASED_OUTPATIENT_CLINIC_OR_DEPARTMENT_OTHER): Payer: 59 | Admitting: Anesthesiology

## 2021-09-30 DIAGNOSIS — D123 Benign neoplasm of transverse colon: Secondary | ICD-10-CM | POA: Diagnosis not present

## 2021-09-30 DIAGNOSIS — Q438 Other specified congenital malformations of intestine: Secondary | ICD-10-CM | POA: Diagnosis not present

## 2021-09-30 DIAGNOSIS — K6389 Other specified diseases of intestine: Secondary | ICD-10-CM

## 2021-09-30 DIAGNOSIS — R1032 Left lower quadrant pain: Secondary | ICD-10-CM | POA: Insufficient documentation

## 2021-09-30 DIAGNOSIS — D122 Benign neoplasm of ascending colon: Secondary | ICD-10-CM | POA: Diagnosis not present

## 2021-09-30 DIAGNOSIS — K5730 Diverticulosis of large intestine without perforation or abscess without bleeding: Secondary | ICD-10-CM

## 2021-09-30 DIAGNOSIS — K6289 Other specified diseases of anus and rectum: Secondary | ICD-10-CM

## 2021-09-30 DIAGNOSIS — G4730 Sleep apnea, unspecified: Secondary | ICD-10-CM | POA: Diagnosis not present

## 2021-09-30 DIAGNOSIS — K635 Polyp of colon: Secondary | ICD-10-CM

## 2021-09-30 HISTORY — PX: COLONOSCOPY WITH PROPOFOL: SHX5780

## 2021-09-30 HISTORY — PX: BIOPSY: SHX5522

## 2021-09-30 HISTORY — PX: POLYPECTOMY: SHX5525

## 2021-09-30 SURGERY — COLONOSCOPY WITH PROPOFOL
Anesthesia: Monitor Anesthesia Care

## 2021-09-30 MED ORDER — PROPOFOL 500 MG/50ML IV EMUL
INTRAVENOUS | Status: DC | PRN
  Administered 2021-09-30: 75 ug/kg/min via INTRAVENOUS

## 2021-09-30 MED ORDER — LACTATED RINGERS IV SOLN
INTRAVENOUS | Status: DC
  Administered 2021-09-30: 1000 mL via INTRAVENOUS

## 2021-09-30 MED ORDER — SODIUM CHLORIDE 0.9 % IV SOLN: INTRAVENOUS | Status: DC

## 2021-09-30 SURGICAL SUPPLY — 22 items

## 2021-09-30 NOTE — Interval H&P Note (Signed)
History and Physical Interval Note: ? ?09/30/2021 ?11:26 AM ? ?LAWRANCE WIEDEMANN  has presented today for surgery, with the diagnosis of LLQ pain.  The various methods of treatment have been discussed with the patient and family. After consideration of risks, benefits and other options for treatment, the patient has consented to  Procedure(s): ?COLONOSCOPY WITH PROPOFOL (N/A) as a surgical intervention.  The patient's history has been reviewed, patient examined, no change in status, stable for surgery.  I have reviewed the patient's chart and labs.  Questions were answered to the patient's satisfaction.   ? ? ?Raymond Kelly ? ? ?

## 2021-09-30 NOTE — Transfer of Care (Signed)
Immediate Anesthesia Transfer of Care Note ? ?Patient: Raymond Kelly ? ?Procedure(s) Performed: Procedure(s): ?COLONOSCOPY WITH PROPOFOL (N/A) ?POLYPECTOMY ?BIOPSY ? ?Patient Location: PACU and Endoscopy Unit ? ?Anesthesia Type:MAC ? ?Level of Consciousness: awake, alert  and oriented ? ?Airway & Oxygen Therapy: Patient Spontanous Breathing and Patient connected to nasal cannula oxygen ? ?Post-op Assessment: Report given to RN and Post -op Vital signs reviewed and stable ? ?Post vital signs: Reviewed and stable ? ?Last Vitals:  ?Vitals:  ? 09/30/21 1030  ?BP: 132/78  ?Pulse: 64  ?Resp: 17  ?Temp: 36.5 ?C  ?SpO2: 98%  ? ? ?Complications: No apparent anesthesia complications ? ?

## 2021-09-30 NOTE — Discharge Instructions (Signed)
YOU HAD AN ENDOSCOPIC PROCEDURE TODAY: Refer to the procedure report and other information in the discharge instructions given to you for any specific questions about what was found during the examination. If this information does not answer your questions, please call [place] office at [phone] to clarify.  ° °YOU SHOULD EXPECT: Some feelings of bloating in the abdomen. Passage of more gas than usual. Walking can help get rid of the air that was put into your GI tract during the procedure and reduce the bloating. If you had a lower endoscopy (such as a colonoscopy or flexible sigmoidoscopy) you may notice spotting of blood in your stool or on the toilet paper. Some abdominal soreness may be present for a day or two, also. ° °DIET: Your first meal following the procedure should be a light meal and then it is ok to progress to your normal diet. A half-sandwich or bowl of soup is an example of a good first meal. Heavy or fried foods are harder to digest and may make you feel nauseous or bloated. Drink plenty of fluids but you should avoid alcoholic beverages for 24 hours. If you had a esophageal dilation, please see attached instructions for diet.   ° °ACTIVITY: Your care partner should take you home directly after the procedure. You should plan to take it easy, moving slowly for the rest of the day. You can resume normal activity the day after the procedure however YOU SHOULD NOT DRIVE, use power tools, machinery or perform tasks that involve climbing or major physical exertion for 24 hours (because of the sedation medicines used during the test).  ° °SYMPTOMS TO REPORT IMMEDIATELY: °A gastroenterologist can be reached at any hour. Please call [phone]  for any of the following symptoms:  °Following lower endoscopy (colonoscopy, flexible sigmoidoscopy) °Excessive amounts of blood in the stool  °Significant tenderness, worsening of abdominal pains  °Swelling of the abdomen that is new, acute  °Fever of 100° or  higher  °Following upper endoscopy (EGD, EUS, ERCP, esophageal dilation) °Vomiting of blood or coffee ground material  °New, significant abdominal pain  °New, significant chest pain or pain under the shoulder blades  °Painful or persistently difficult swallowing  °New shortness of breath  °Black, tarry-looking or red, bloody stools ° °FOLLOW UP:  °If any biopsies were taken you will be contacted by phone or by letter within the next 1-3 weeks. Call [phone]  if you have not heard about the biopsies in 3 weeks.  °Please also call with any specific questions about appointments or follow up tests. ° °

## 2021-09-30 NOTE — H&P (Signed)
History and Physical: ? This patient presents for endoscopic testing for: ?Left lower quadrant pain ? ?Clinical details in my last office note from 08/15/2021. ?Continued intermittent LLQ pain (1-2 days) and flatulence. ?Has not improved with Abx ?Procedure here due to anesthesia records indicating difficult airway. ? ?ROS: ?Patient denies chest pain or shortness of breath ? ? ?Past Medical History: ?Past Medical History:  ?Diagnosis Date  ? Diverticulitis   ? Family history of adverse reaction to anesthesia   ? mother slow to awaken  ? History of kidney stones 2007  ? ? ? ?Past Surgical History: ?Past Surgical History:  ?Procedure Laterality Date  ? CHOLECYSTECTOMY N/A 02/01/2016  ? Procedure: LAPAROSCOPIC CHOLECYSTECTOMY WITH INTRAOPERATIVE CHOLANGIOGRAM;  Surgeon: Coralie Keens, MD;  Location: [place];  Service: General;  Laterality: N/A;  ? COLONOSCOPY    ? cyst removed    ? between his front teeth and cyst removed from abdomal area  ? ESOPHAGOGASTRODUODENOSCOPY (EGD) WITH PROPOFOL N/A 03/06/2016  ? Procedure: ESOPHAGOGASTRODUODENOSCOPY (EGD) WITH PROPOFOL;  Surgeon: Doran Stabler, MD;  Location: [facility];  Service: Gastroenterology;  Laterality: N/A;  with stent removal.  ? GASTROINTESTINAL STENT REMOVAL N/A 03/06/2016  ? Procedure: GASTROINTESTINAL STENT REMOVAL;  Surgeon: Doran Stabler, MD;  Location: [facility];  Service: Gastroenterology;  Laterality: N/A;  ? TONSILLECTOMY    ? ULNAR NERVE TRANSPOSITION    ? ? ?Allergies: ?No Known Allergies ? ?Outpatient Meds: ?Current Facility-Administered Medications  ?Medication Dose Route Frequency Provider Last Rate Last Admin  ? 0.9 %  sodium chloride infusion   Intravenous Continuous Danis, Estill Cotta III, MD      ? lactated ringers infusion   Intravenous Continuous [name], MD 10 mL/hr at 09/30/21 1037 1,000 mL at 09/30/21 1037  ? ? ? ? ?___________________________________________________________________ ?Objective  ? ?Exam: ? ?BP 132/78    Pulse 64   Temp 97.7 ?F (36.5 ?C) (Oral)   Resp 17   Ht '5\' 8"'$  (1.727 m)   Wt 81 kg   SpO2 98%   BMI 27.15 kg/m?  ? ?CV: RRR without murmur, S1/S2 ?Resp: clear to auscultation bilaterally, normal RR and effort noted ?GI: soft, no tenderness, with active bowel sounds. ? ? ?Assessment: ?LLQ pain ? ? ?Plan: ?Colonoscopy ? The benefits and risks of the planned procedure were described in detail with the patient or (when appropriate) their health care proxy.  Risks were outlined as including, but not limited to, bleeding, infection, perforation, adverse medication reaction leading to cardiac or pulmonary decompensation, pancreatitis (if ERCP).  The limitation of incomplete mucosal visualization was also discussed.  No guarantees or warranties were given. ? ? ? ?The patient is appropriate for an endoscopic procedure in the ambulatory setting. ? ? - Wilfrid Lund, MD ? ? ? ? ? ?

## 2021-09-30 NOTE — Anesthesia Preprocedure Evaluation (Addendum)
Anesthesia Evaluation  ?Patient identified by MRN, date of birth, ID band ?Patient awake ? ? ? ?Reviewed: ?Allergy & Precautions, NPO status , Patient's Chart, lab work & pertinent test results ? ?History of Anesthesia Complications ?Negative for: history of anesthetic complications ? ?Airway ?Mallampati: II ? ?TM Distance: >3 FB ?Neck ROM: Full ? ? ? Dental ? ?(+) Dental Advisory Given ?  ?Pulmonary ?sleep apnea (mild, does not use CPAP) ,  ?  ?breath sounds clear to auscultation ? ? ? ? ? ? Cardiovascular ?negative cardio ROS ? ? ?Rhythm:Regular Rate:Normal ? ? ?  ?Neuro/Psych ?negative neurological ROS ?   ? GI/Hepatic ?negative GI ROS, Neg liver ROS,   ?Endo/Other  ?negative endocrine ROS ? Renal/GU ?H/o stones  ? ?  ?Musculoskeletal ? ? Abdominal ?  ?Peds ? Hematology ?negative hematology ROS ?(+)   ?Anesthesia Other Findings ? ? Reproductive/Obstetrics ? ?  ? ? ? ? ? ? ? ? ? ? ? ? ? ?  ?  ? ? ? ? ? ? ? ?Anesthesia Physical ?Anesthesia Plan ? ?ASA: 2 ? ?Anesthesia Plan: MAC  ? ?Post-op Pain Management: Minimal or no pain anticipated  ? ?Induction:  ? ?PONV Risk Score and Plan: 1 and Treatment may vary due to age or medical condition ? ?Airway Management Planned: Natural Airway and Simple Face Mask ? ?Additional Equipment: None ? ?Intra-op Plan:  ? ?Post-operative Plan:  ? ?Informed Consent: I have reviewed the patients History and Physical, chart, labs and discussed the procedure including the risks, benefits and alternatives for the proposed anesthesia with the patient or authorized representative who has indicated his/her understanding and acceptance.  ? ? ? ?Dental advisory given ? ?Plan Discussed with: CRNA and Surgeon ? ?Anesthesia Plan Comments:   ? ? ? ? ? ?Anesthesia Quick Evaluation ? ?

## 2021-09-30 NOTE — Anesthesia Postprocedure Evaluation (Signed)
Anesthesia Post Note ? ?Patient: Raymond Kelly ? ?Procedure(s) Performed: COLONOSCOPY WITH PROPOFOL ?POLYPECTOMY ?BIOPSY ? ?  ? ?Patient location during evaluation: Endoscopy ?Anesthesia Type: MAC ?Level of consciousness: awake and alert, oriented and patient cooperative ?Pain management: pain level controlled ?Vital Signs Assessment: post-procedure vital signs reviewed and stable ?Respiratory status: spontaneous breathing, nonlabored ventilation and respiratory function stable ?Cardiovascular status: blood pressure returned to baseline and stable ?Postop Assessment: no apparent nausea or vomiting ?Anesthetic complications: no ? ? ?No notable events documented. ? ?Last Vitals:  ?Vitals:  ? 09/30/21 1203 09/30/21 1211  ?BP: 116/64 105/64  ?Pulse: (!) 56 (!) 49  ?Resp: 12 16  ?Temp: 36.4 ?C   ?SpO2: 100% 100%  ?  ?Last Pain:  ?Vitals:  ? 09/30/21 1211  ?TempSrc:   ?PainSc: 0-No pain  ? ? ?  ?  ?  ?  ?  ?  ? ?Tivon Lemoine,E. Nitesh Pitstick ? ? ? ? ?

## 2021-09-30 NOTE — Op Note (Signed)
[facility] ?Patient Name: Raymond Kelly ?Procedure Date: 09/30/2021 ?MRN: [id number] ?Attending MD: Estill Cotta. Loletha Carrow , MD ?Date of Birth: May 20, 1971 ?CSN: [id number] ?Age: 51 ?Admit Type: Inpatient ?Procedure:                Colonoscopy ?Indications:              Abdominal pain in the left lower quadrant ?                          Intermittent pain (1-2 days) with flatulence since  ?                          CT-proven diverticulitis Oct 2022 ?                          Repeat CT scan with sigmoid wall thickening, no  ?                          clear diverticulitis - Sx not improved with Abx,  ?                          not significantly improved on dicyclomine ?Providers:                Estill Cotta. Loletha Carrow, MD, Dulcy Fanny, Charlean Merl  ?                          Purcell Nails, Merchant [employer] officer, Eliberto Ivory ?Referring MD:              ?Medicines:                Monitored Anesthesia Care ?Complications:            No immediate complications. ?Estimated Blood Loss:     Estimated blood loss was minimal. ?Procedure:                Pre-Anesthesia Assessment: ?                          - Prior to the procedure, a History and Physical  ?                          was performed, and patient medications and  ?                          allergies were reviewed. The patient's tolerance of  ?                          previous anesthesia was also reviewed. The risks  ?                          and benefits of the procedure and the sedation  ?                          options and risks were discussed with the patient.  ?  All questions were answered, and informed consent  ?                          was obtained. Prior Anticoagulants: The patient has  ?                          taken no previous anticoagulant or antiplatelet  ?                          agents. ASA Grade Assessment: II - A patient with  ?                          mild systemic disease. After reviewing the risks  ?                          and  benefits, the patient was deemed in  ?                          satisfactory condition to undergo the procedure. ?                          After obtaining informed consent, the colonoscope  ?                          was passed under direct vision. Throughout the  ?                          procedure, the patient's blood pressure, pulse, and  ?                          oxygen saturations were monitored continuously. The  ?                          CF-HQ190L ([id number]) Olympus colonoscope was  ?                          introduced through the anus and advanced to the the  ?                          terminal ileum, with identification of the  ?                          appendiceal orifice and IC valve. The colonoscopy  ?                          was performed without difficulty. The patient  ?                          tolerated the procedure well. The quality of the  ?                          bowel preparation was excellent. The terminal  ?  ileum, ileocecal valve, appendiceal orifice, and  ?                          rectum were photographed. ?Scope In: 11:36:45 AM ?Scope Out: 11:58:00 AM ?Scope Withdrawal Time: 0 hours 16 minutes 54 seconds  ?Total Procedure Duration: 0 hours 21 minutes 15 seconds  ?Findings: ?     The perianal and digital rectal examinations were normal. ?     The terminal ileum appeared normal. ?     Two sessile polyps were found in the splenic flexure and ascending  ?     colon. The polyps were diminutive in size. These polyps were removed  ?     with a cold snare. Resection and retrieval were complete. ?     The recto-sigmoid colon was tortuous. ?     A few small-mouthed diverticula were found in the recto-sigmoid colon. ?     A patchy area of mildly erythematous mucosa was found in the  ?     recto-sigmoid colon. Biopsies were taken with a cold forceps for  ?     histology. ?     The exam was otherwise without abnormality on direct and retroflexion  ?      views. ?Impression:               - The examined portion of the ileum was normal. ?                          - Two diminutive polyps at the splenic flexure and  ?                          in the ascending colon, removed with a cold snare.  ?                          Resected and retrieved. ?                          - Tortuous colon. ?                          - Diverticulosis in the recto-sigmoid colon. ?                          - Erythematous mucosa in the recto-sigmoid colon.  ?                          Biopsied. ?                          - The examination was otherwise normal on direct  ?                          and retroflexion views. ?                          Nonspecific mild mucosal findings - not clear  ?                          how-if related to symptoms. ? 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     MAC sedation used ?Recommendation:           - Patient has a contact number available for  ?                          emergencies. The signs and symptoms of potential  ?                          delayed complications were discussed with the  ?                          patient. Return to normal activities tomorrow.  ?                          Written discharge instructions were provided to the  ?                          patient. ?                          - Resume previous diet. ?                          - Continue present medications. ?                          - Await pathology results. ?                          - Repeat colonoscopy is recommended for  ?                          surveillance. The colonoscopy date will be  ?                          determined after pathology results from today's  ?                          exam become available for review. (remove the Dec  ?                          2031 recall) ?                          - Return to my office at appointment to be  ?                          scheduled. ?Procedure Code(s):        ---  Professional --- ?                          [phone], Colonoscopy, flexible; with removal of  ?                          tumor(s), polyp(s), or other lesion(s) by snare  ?  technique ?                          45380, 59, Colonoscopy, flexible; with biopsy,  ?                          single or multiple ?Diagnosis Code(s):        --- Professional --- ?                          K63.5, Polyp of colon ?                          K63.89, Other specified diseases of intestine ?                          R10.32, Left lower quadrant pain ?                          K57.30, Diverticulosis of large intestine without  ?                          perforation or abscess without bleeding ?                          Q43.8, Other specified congenital malformations of  ?                          intestine ?CPT copyright 2019 American Medical Association. All rights reserved. ?The codes documented in this report are preliminary and upon coder review may  ?be revised to meet current compliance requirements. ?Jahkeem Kurka L. Loletha Carrow, MD ?09/30/2021 12:08:31 PM ?This report has been signed electronically. ?Number of Addenda: 0 ?

## 2021-10-01 LAB — SURGICAL PATHOLOGY

## 2021-10-03 ENCOUNTER — Other Ambulatory Visit: Payer: Self-pay | Admitting: Physician Assistant

## 2021-10-03 DIAGNOSIS — R42 Dizziness and giddiness: Secondary | ICD-10-CM

## 2021-10-03 DIAGNOSIS — R4701 Aphasia: Secondary | ICD-10-CM

## 2021-10-14 ENCOUNTER — Other Ambulatory Visit: Payer: Self-pay | Admitting: Gastroenterology

## 2021-10-14 MED ORDER — MESALAMINE 800 MG PO TBEC: 1.0000 | DELAYED_RELEASE_TABLET | Freq: Two times a day (BID) | ORAL | 1 refills | Status: DC

## 2021-10-15 ENCOUNTER — Other Ambulatory Visit: Payer: Self-pay

## 2021-10-15 MED ORDER — MESALAMINE 400 MG PO CPDR: 800.0000 mg | DELAYED_RELEASE_CAPSULE | Freq: Two times a day (BID) | ORAL | 1 refills | Status: DC

## 2021-10-15 NOTE — Telephone Encounter (Signed)
PA was submitted with cover my meds- denied. Please advise on alternative  ? ?[facility] (Key: [id number]) - [id number] ?Asacol HD '800MG'$  dr tablets ?    ?Status: PA Response - Denied ? ?Created: April 10th, 2023 ? ?Sent: April 10th, 2023 ?

## 2021-10-15 NOTE — Telephone Encounter (Signed)
Please see my earlier message for switch to Delzicol 400 mg tablets ?Take 2 tablets twice daily ?Dispense 120, 1 refill ?

## 2021-10-15 NOTE — Telephone Encounter (Signed)
Med denied without PA ? ?Please change to delzicol 400 mg caplet ?Take two caplets twice daily ?Disp :120, RF 1 ?

## 2021-10-16 ENCOUNTER — Other Ambulatory Visit: Payer: Self-pay | Admitting: Physician Assistant

## 2021-10-16 DIAGNOSIS — R42 Dizziness and giddiness: Secondary | ICD-10-CM

## 2021-10-16 DIAGNOSIS — R4701 Aphasia: Secondary | ICD-10-CM

## 2021-10-17 ENCOUNTER — Other Ambulatory Visit: Payer: Self-pay | Admitting: Radiology

## 2021-10-17 ENCOUNTER — Ambulatory Visit
Admission: RE | Admit: 2021-10-17 | Discharge: 2021-10-17 | Disposition: A | Discharge status: Home / Self Care | Payer: 59 | Source: Ambulatory Visit | Attending: Physician Assistant | Admitting: Physician Assistant

## 2021-10-17 DIAGNOSIS — R42 Dizziness and giddiness: Secondary | ICD-10-CM

## 2021-10-17 DIAGNOSIS — R4701 Aphasia: Secondary | ICD-10-CM

## 2021-10-17 IMAGING — MR MR HEAD W/O CM
10 series · 48 of 48 positions shown · non-contrast
Comparison: [DATE]

CLINICAL DATA: Dizziness, confusion

EXAM:
MRI HEAD WITHOUT CONTRAST
TECHNIQUE: Multiplanar, multiecho pulse sequences of the brain and surrounding
structures were obtained without intravenous contrast.

[Series 2: T1 · sagittal · 5.0mm · 0.45mm/px · 1 of 21 slices shown]
[im 1/21]
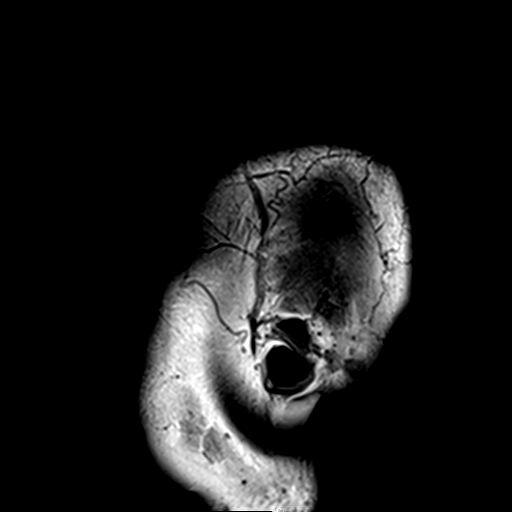

[Series 3: DWI · axial · 3.0mm · 1.80mm/px · z∈[-41,+107]mm · 9 of 102 slices shown (1 of 4)]
[im 1/102]
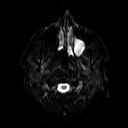
[im 13/102]
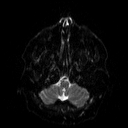
[im 26/102]
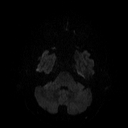
[im 38/102]
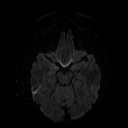
[im 51/102]
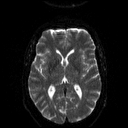
[im 64/102]
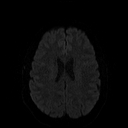
[im 76/102]
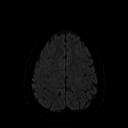
[im 89/102]
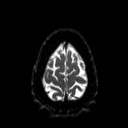
[im 102/102]
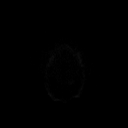

[Series 4: DWI · axial · 3.0mm · 1.80mm/px · z∈[-41,+107]mm · 4 of 49 slices shown (2 of 4)]
[im 1/49]
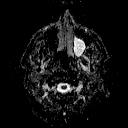
[im 17/49]
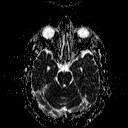
[im 33/49]
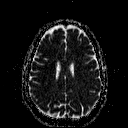
[im 49/49]
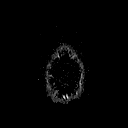

[Series 5: DWI · coronal · 5.0mm · 1.80mm/px · 7 of 76 slices shown (3 of 4)]
[im 1/76]
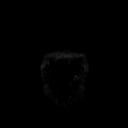
[im 13/76]
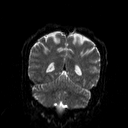
[im 26/76]
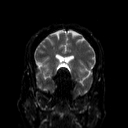
[im 38/76]
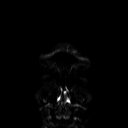
[im 51/76]
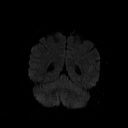
[im 63/76]
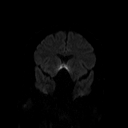
[im 76/76]
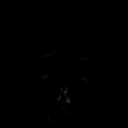

[Series 6: DWI · coronal · 5.0mm · 1.80mm/px · 3 of 38 slices shown (4 of 4)]
[im 1/38]
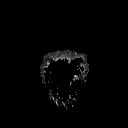
[im 19/38]
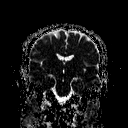
[im 38/38]
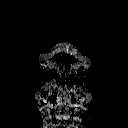

[Series 7: T2 · axial · 5.0mm · 0.51mm/px · z∈[-45,+107]mm · 2 of 23 slices shown (1 of 2)]
[im 1/23]
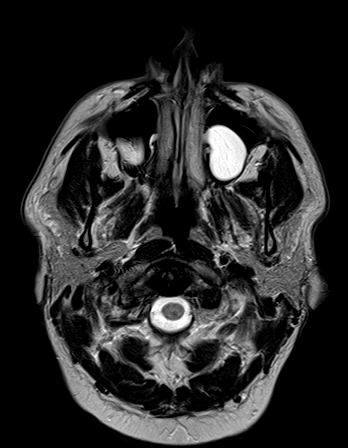
[im 23/23]
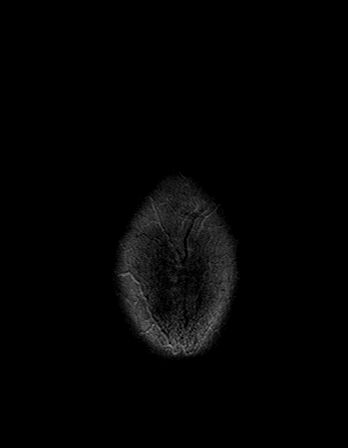

[Series 8: FLAIR · axial · 3.0mm · 0.45mm/px · z∈[-34,+99]mm · 3 of 30 slices shown]
[im 1/30]
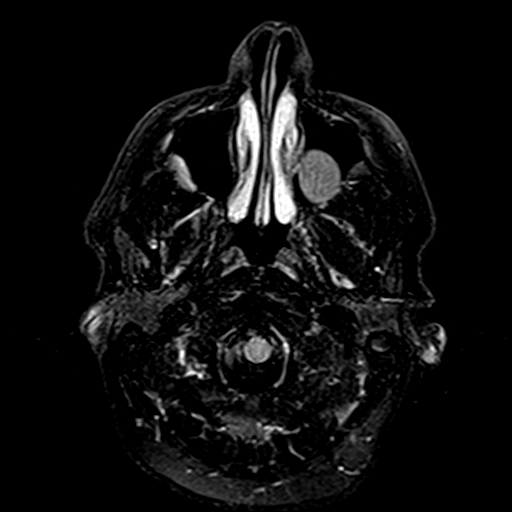
[im 15/30]
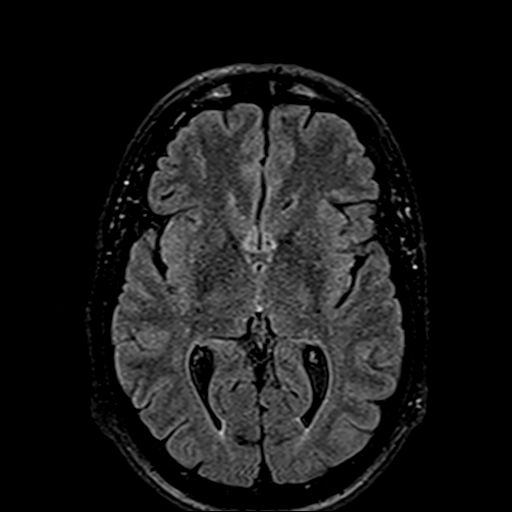
[im 30/30]
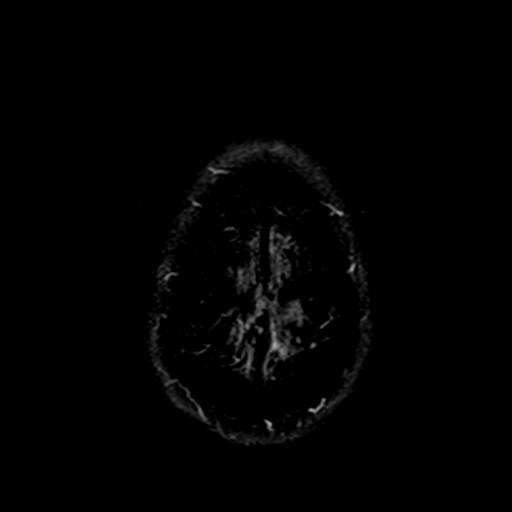

[Series 10: swi_images · axial · 4.0mm · 0.90mm/px · z∈[-37,+101]mm · 3 of 36 slices shown]
[im 1/36]
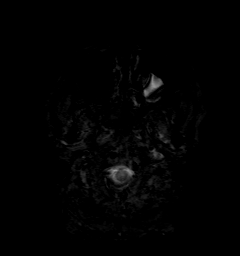
[im 18/36]
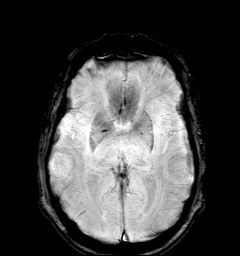
[im 36/36]
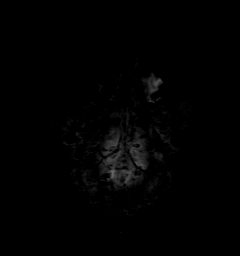

[Series 11: t1_mpr_tra · axial · 1.0mm · 0.71mm/px · z∈[-40,+102]mm · 13 of 144 slices shown]
[im 1/144]
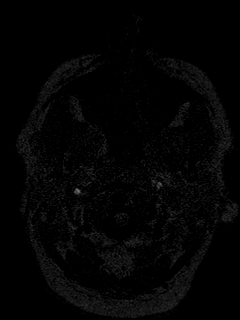
[im 12/144]
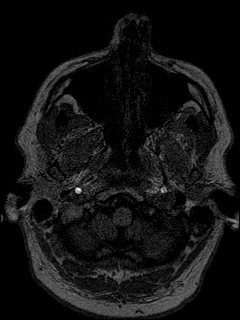
[im 24/144]
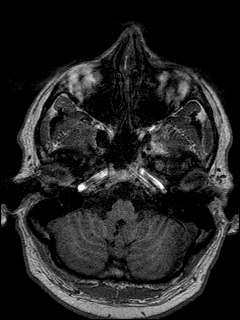
[im 36/144]
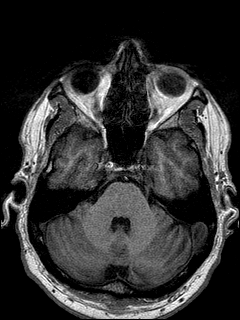
[im 48/144]
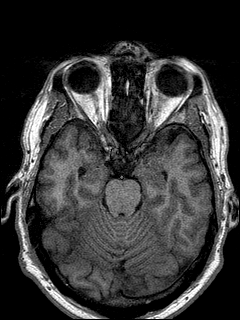
[im 60/144]
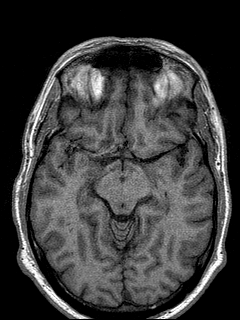
[im 72/144]
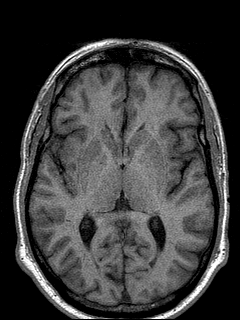
[im 84/144]
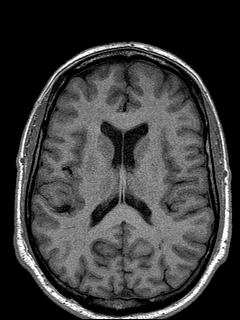
[im 96/144]
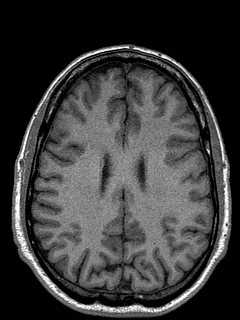
[im 108/144]
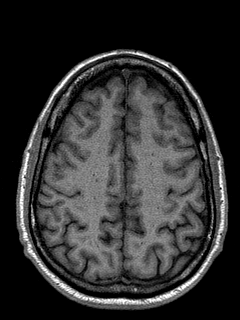
[im 120/144]
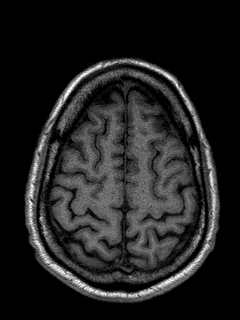
[im 132/144]
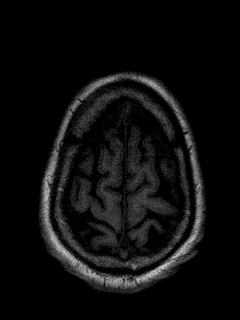
[im 144/144]
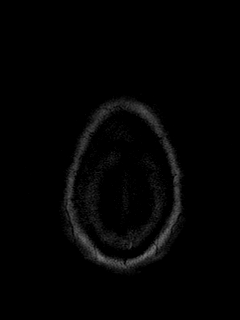

[Series 12: T2 · coronal · 5.0mm · 0.45mm/px · 3 of 30 slices shown (2 of 2)]
[im 1/30]
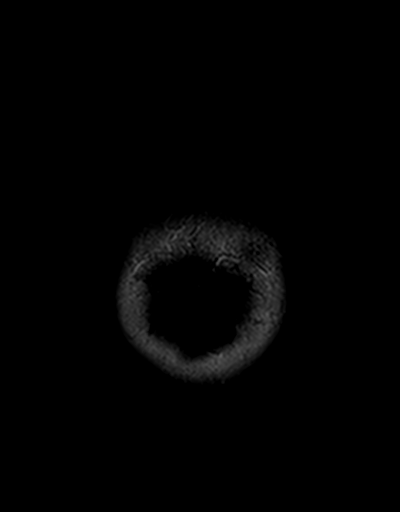
[im 15/30]
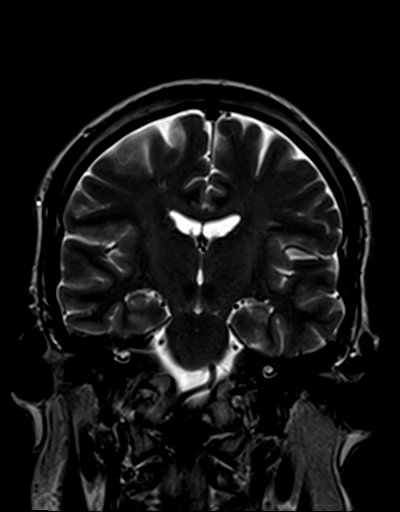
[im 30/30]
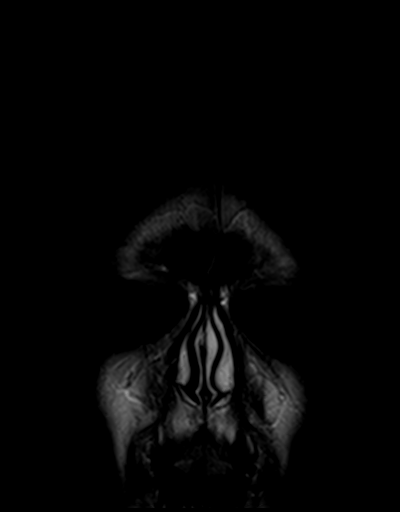

[48 of 48 positions shown; findings below may reference images not displayed]

FINDINGS: Brain: There is no acute infarction or intracranial hemorrhage.
There is no intracranial mass, mass effect, or edema. There is no
hydrocephalus or extra-axial fluid collection. Ventricles and sulci
are normal in size and configuration.

Vascular: Major vessel flow voids at the skull base are preserved.

Skull and upper cervical spine: Normal marrow signal is preserved.

Sinuses/Orbits: Maxillary sinus retention cysts. Orbits are
unremarkable.

Other: Sella is unremarkable.  Mastoid air cells are clear.
IMPRESSION: No recent infarction, hemorrhage, or mass.

## 2021-10-28 ENCOUNTER — Other Ambulatory Visit: Payer: Self-pay

## 2021-10-28 ENCOUNTER — Telehealth: Payer: Self-pay

## 2021-10-28 MED ORDER — MESALAMINE 1.2 G PO TBEC: 2.4000 g | DELAYED_RELEASE_TABLET | Freq: Every day | ORAL | 1 refills | Status: DC

## 2021-10-28 NOTE — Telephone Encounter (Signed)
Please send Rx for Lialda 1.2 gram tablets ?Sig:  2 tablets once daily. ?Disp# 60, RF 1 ? ? ?- HD ?

## 2021-10-28 NOTE — Telephone Encounter (Signed)
Rx has been sent as directed. Patient is aware ? ?

## 2021-10-28 NOTE — Telephone Encounter (Signed)
[facility] (Key: [id number]) - [id number] ?Asacol HD '800MG'$  dr tablets.Status: PA Response - Denied ?Per the pharmacy Asacol and Delzicol have been denied for coverage. ?The pharmacy said they will cover Apriso or Lialda. Please advise on new Rx  ?

## 2021-11-20 ENCOUNTER — Other Ambulatory Visit: Payer: Self-pay | Admitting: Gastroenterology

## 2021-11-28 ENCOUNTER — Encounter: Payer: Self-pay | Admitting: Gastroenterology

## 2021-11-28 ENCOUNTER — Ambulatory Visit (INDEPENDENT_AMBULATORY_CARE_PROVIDER_SITE_OTHER): Payer: 59 | Admitting: Gastroenterology

## 2021-11-28 VITALS — BP 110/60 | Wt 178.0 lb

## 2021-11-28 DIAGNOSIS — R1032 Left lower quadrant pain: Secondary | ICD-10-CM

## 2021-11-28 DIAGNOSIS — R7989 Other specified abnormal findings of blood chemistry: Secondary | ICD-10-CM | POA: Diagnosis not present

## 2021-11-28 NOTE — Patient Instructions (Signed)
If you are age 53 or older, your body mass index should be between 23-30. Your Body mass index is 27.06 kg/m. If this is out of the aforementioned range listed, please consider follow up with your Primary Care Provider.  If you are age 48 or younger, your body mass index should be between 19-25. Your Body mass index is 27.06 kg/m. If this is out of the aformentioned range listed, please consider follow up with your Primary Care Provider.   ________________________________________________________  The [facility] providers would like to encourage you to use [facility] to communicate with providers for non-urgent requests or questions.  Due to long hold times on the telephone, sending your provider a message by [facility] may be a faster and more efficient way to get a response.  Please allow 48 business hours for a response.  Please remember that this is for non-urgent requests.  _______________________________________________________  Please follow up as needed.  It was a pleasure to see you today!  Thank you for trusting me with your gastrointestinal care!

## 2021-11-28 NOTE — Progress Notes (Addendum)
[facility] Progress Note  Chief Complaint: Left lower quadrant pain, diverticulosis  Subjective  History: Raymond Kelly was seen for follow-up of his left lower quadrant and pelvic pain.  Previous notes detail the history with persistent pain after an episode of CT proven diverticulitis last fall.  Subsequent CT February of this year with sigmoid diverticulosis and wall thickening but no inflammatory changes.  Not improved with additional trials of antibiotics or dicyclomine.  Repeat colonoscopy revealed 2 diminutive adenomatous left colon polyps as well as mild nonspecific patchy sigmoid erythema, biopsies of that area normal.  He agreed to a trial of mesalamine for possible SCAD.  Raymond Kelly has been taking Lialda without change in symptoms.  He had only taken a couple doses of the hyoscyamine and did not think that it helped.  Every 5 to 7 days he will have some left-sided crampy pain with feelings of bloating and gas that will last a few hours and resolved.  He is also struggling with his mood and sleep as well as stressors at work and at home.  He notices the abdominal pain more often under stress Bowel habits are regular without rectal bleeding. ROS: Cardiovascular:  no chest pain Respiratory: no dyspnea  The patient's Past Medical, Family and Social History were reviewed and are on file in the EMR.  Objective:  Med list reviewed  Current Outpatient Medications:    eszopiclone (LUNESTA) 2 MG TABS tablet, Take 2 mg by mouth at bedtime. Take immediately before bedtime, Disp: , Rfl:    LIALDA 1.2 g EC tablet, TAKE 2 TABLETS BY MOUTH DAILY WITH BREAKFAST., Disp: 60 tablet, Rfl: 1   methylcellulose (CITRUCEL) oral powder, Take 1 packet by mouth daily., Disp: , Rfl:    sertraline (ZOLOFT) 25 MG tablet, Take 25 mg by mouth daily., Disp: , Rfl:   He resumed sertraline several days ago.  Vital signs in last 24 hrs: Vitals:   11/28/21 0926  BP: 110/60   Wt Readings from Last 3 Encounters:   11/28/21 178 lb (80.7 kg)  09/30/21 178 lb 9.2 oz (81 kg)  08/15/21 178 lb 8 oz (81 kg)    Physical Exam  Well-appearing, stressed affect  Cardiac: RRR without murmurs, S1S2 heard, no peripheral edema Pulm: clear to auscultation bilaterally, normal RR and effort noted Abdomen: soft, mild LLQ tenderness to deep palpation, with active bowel sounds. No guarding or palpable hepatosplenomegaly. Skin; warm and dry, no jaundice or rash  Labs:  [facility] labs 10/04/21 - nml CBC, T bili 1.3, o/w nml LFTs ___________________________________________ Radiologic studies:   ____________________________________________ Other:   FINAL MICROSCOPIC DIAGNOSIS:   A. COLON, ASCENDING, TRANSVERSE, POLYPECTOMY:  Tubular adenomas  Negative for high-grade dysplasia and carcinoma   B. COLON, BIOPSY:  Benign colonic mucosa with lymphoid aggregates and no diagnostic  abnormality    _____________________________________________ Assessment & Plan  Assessment: Encounter Diagnoses  Name Primary?   LLQ abdominal pain Yes   LFTs abnormal   Slight elevation bilirubin with otherwise normal LFTs.  Looks like Gilbert's, and I reassured him  Raymond Kelly appears to have functional abdominal pain ever since an episode of diverticulitis over 6 months ago.  He does not have chronic diverticulitis or SCAD based on imaging, colonoscopy/biopsy findings and lack of improvement with various medicines.  I recommended he stop the mesalamine and I did my best to reassure him. Low gas diet.  He was concerned about his low blood pressure today and wondered if that may be related  to his feelings of fatigue and brain fog, so I recommended he get a blood pressure cuff and check at home twice a day to keep a log and then contact his primary care about it.  Hopefully sertraline will be helpful with his mood issues, and if not, perhaps a discussion with primary care about change to Cymbalta is worth considering since it might also  help with chronic pain.   Raymond Kelly will see me again as needed.   22 minutes were spent on this encounter (including chart review, history/exam, counseling/coordination of care, and documentation) > 50% of that time was spent on counseling and coordination of care.   [name]

## 2021-12-10 ENCOUNTER — Encounter: Payer: Self-pay | Admitting: *Deleted

## 2021-12-10 ENCOUNTER — Other Ambulatory Visit: Payer: Self-pay | Admitting: *Deleted

## 2021-12-11 ENCOUNTER — Ambulatory Visit (INDEPENDENT_AMBULATORY_CARE_PROVIDER_SITE_OTHER): Payer: 59 | Admitting: Diagnostic Neuroimaging

## 2021-12-11 ENCOUNTER — Encounter: Payer: Self-pay | Admitting: Diagnostic Neuroimaging

## 2021-12-11 VITALS — BP 125/84 | HR 60 | Ht 68.0 in | Wt 175.0 lb

## 2021-12-11 DIAGNOSIS — R4189 Other symptoms and signs involving cognitive functions and awareness: Secondary | ICD-10-CM | POA: Diagnosis not present

## 2021-12-11 DIAGNOSIS — R413 Other amnesia: Secondary | ICD-10-CM | POA: Diagnosis not present

## 2021-12-11 DIAGNOSIS — F419 Anxiety disorder, unspecified: Secondary | ICD-10-CM

## 2021-12-11 DIAGNOSIS — R41840 Attention and concentration deficit: Secondary | ICD-10-CM

## 2021-12-11 DIAGNOSIS — G4700 Insomnia, unspecified: Secondary | ICD-10-CM

## 2021-12-11 NOTE — Patient Instructions (Signed)
  ATTENTION / COGNITIVE DIFFICULTIES / ANXIETY / INSOMNIA - continue sertraline, lunesta - follow up with psychiatry  MILD OSA - follow up with Dr. Annamaria Boots for CPAP

## 2021-12-11 NOTE — Progress Notes (Signed)
[facility]  PATIENT: Raymond Kelly DOB: [birth year]  REFERRING CLINICIAN: Heywood Bene, * HISTORY FROM: patient REASON FOR VISIT: new consult   HISTORICAL  CHIEF COMPLAINT:  Chief Complaint  Patient presents with   Dizziness    RM 7 alone Pt is well, has been having dizziness and aphasia for about 6 months. It takes him some time to think about what he wants to say. He doesn't sleep well and has stressful days.    HISTORY OF PRESENT ILLNESS:   51 year old male here for evaluation of dizziness, confusion, forgetfulness, language difficulty, attention difficulty.  2021 patient had COVID and associated brain fog, confusion.  He went to the hospital for evaluation and MRI and EEG at that time were unremarkable.  Symptoms slightly improved but did not resolve.  Over the past 7 to 8 months patient is also having constellation of other issues mainly related to stomach pain, diverticulitis, anxiety, insomnia, work-related stress.  Symptoms have progressively worsened over time.  He was averaging 3 hours of sleep per night.  He has recently been started on Lunesta for the past 2 months.  Also started on sertraline 2 weeks ago.  Still having issues related to anxiety and insomnia, now averaging 4 hours per night.  When patient was younger and in school he had difficult time with academics and concentration.  He was able to complete high school.  He has been working the same job for the last 30 years.  He has not been officially evaluated or tested for ADHD.   REVIEW OF SYSTEMS: Full 14 system review of systems performed and negative with exception of: as per HPI.  ALLERGIES: No Known Allergies  HOME MEDICATIONS: Outpatient Medications Prior to Visit  Medication Sig Dispense Refill   eszopiclone (LUNESTA) 1 MG TABS tablet Take 2 mg by mouth at bedtime.     sertraline (ZOLOFT) 25 MG tablet Take 1 tablet by mouth daily.     LIALDA 1.2 g EC tablet TAKE 2  TABLETS BY MOUTH DAILY WITH BREAKFAST. (Patient not taking: Reported on 12/11/2021) 60 tablet 1   methylcellulose (CITRUCEL) oral powder Take 1 packet by mouth daily. (Patient not taking: Reported on 12/11/2021)     No facility-administered medications prior to visit.    PAST MEDICAL HISTORY: Past Medical History:  Diagnosis Date   Alopecia areata    Diverticulitis    Dizziness    Family history of adverse reaction to anesthesia    mother slow to awaken   History of kidney stones 2007    PAST SURGICAL HISTORY: Past Surgical History:  Procedure Laterality Date   BIOPSY  09/30/2021   Procedure: BIOPSY;  Surgeon: Doran Stabler, MD;  Location: [facility];  Service: Gastroenterology;;   CHOLECYSTECTOMY N/A 02/01/2016   Procedure: LAPAROSCOPIC CHOLECYSTECTOMY WITH INTRAOPERATIVE CHOLANGIOGRAM;  Surgeon: Coralie Keens, MD;  Location: [facility];  Service: General;  Laterality: N/A;   COLONOSCOPY     COLONOSCOPY WITH PROPOFOL N/A 09/30/2021   Procedure: COLONOSCOPY WITH PROPOFOL;  Surgeon: Doran Stabler, MD;  Location: [facility];  Service: Gastroenterology;  Laterality: N/A;   cyst removed     between his front teeth and cyst removed from abdomal area   ESOPHAGOGASTRODUODENOSCOPY (EGD) WITH PROPOFOL N/A 03/06/2016   Procedure: ESOPHAGOGASTRODUODENOSCOPY (EGD) WITH PROPOFOL;  Surgeon: Doran Stabler, MD;  Location: [facility];  Service: Gastroenterology;  Laterality: N/A;  with stent removal.   GASTROINTESTINAL STENT REMOVAL N/A 03/06/2016   Procedure:  GASTROINTESTINAL STENT REMOVAL;  Surgeon: Doran Stabler, MD;  Location: [facility];  Service: Gastroenterology;  Laterality: N/A;   POLYPECTOMY  09/30/2021   Procedure: POLYPECTOMY;  Surgeon: Doran Stabler, MD;  Location: [facility];  Service: Gastroenterology;;   TONSILLECTOMY     ULNAR NERVE TRANSPOSITION     VASECTOMY      FAMILY HISTORY: Family History  Problem Relation Age of Onset   Ulcerative colitis  Mother    Irritable bowel syndrome Mother    Hypertension Mother    Stroke Father    Arthritis Father    ALS Father    Neuropathy Father    Hypertension Father    Other Father        "open stomach"   Diabetes Brother    Pancreatic cancer Maternal Grandfather    Juvenile Diabetes Niece    Colon cancer Neg Hx    Rectal cancer Neg Hx    Stomach cancer Neg Hx    Esophageal cancer Neg Hx     SOCIAL HISTORY: Social History   Socioeconomic History   Marital status: Married    Spouse name: Sharee Pimple   Number of children: 2   Years of education: Not on file   Highest education level: Not on file  Occupational History   Occupation: Press photographer  Tobacco Use   Smoking status: Never   Smokeless tobacco: Never  Vaping Use   Vaping Use: Never used  Substance and Sexual Activity   Alcohol use: Not Currently    Comment: social at times   Drug use: No   Sexual activity: Not on file  Other Topics Concern   Not on file  Social History Narrative   Lives with wife   Social Determinants of Health   Financial Resource Strain: Not on file  Food Insecurity: Not on file  Transportation Needs: Not on file  Physical Activity: Not on file  Stress: Not on file  Social Connections: Not on file  Intimate Partner Violence: Not on file     PHYSICAL EXAM  GENERAL EXAM/CONSTITUTIONAL: Vitals:  Vitals:   12/11/21 0850  BP: 125/84  Pulse: 60  Weight: 175 lb (79.4 kg)  Height: '5\' 8"'$  (1.727 m)   Body mass index is 26.61 kg/m. Wt Readings from Last 3 Encounters:  12/11/21 175 lb (79.4 kg)  11/28/21 178 lb (80.7 kg)  09/30/21 178 lb 9.2 oz (81 kg)   Patient is in no distress; well developed, nourished and groomed; neck is supple  CARDIOVASCULAR: Examination of carotid arteries is normal; no carotid bruits Regular rate and rhythm, no murmurs Examination of peripheral vascular system by observation and palpation is normal  EYES: Ophthalmoscopic exam of optic discs and posterior segments is  normal; no papilledema or hemorrhages No results found.  MUSCULOSKELETAL: Gait, strength, tone, movements noted in Neurologic exam below  NEUROLOGIC: MENTAL STATUS:      View : No data to display.         awake, alert, oriented to person, place and time recent and remote memory intact normal attention and concentration language fluent, comprehension intact, naming intact fund of knowledge appropriate  CRANIAL NERVE:  2nd - no papilledema on fundoscopic exam 2nd, 3rd, 4th, 6th - pupils equal and reactive to light, visual fields full to confrontation, extraocular muscles intact, no nystagmus 5th - facial sensation symmetric 7th - facial strength symmetric 8th - hearing intact 9th - palate elevates symmetrically, uvula midline 11th - shoulder shrug symmetric 12th - tongue  protrusion midline  MOTOR:  normal bulk and tone, full strength in the BUE, BLE  SENSORY:  normal and symmetric to light touch, temperature, vibration  COORDINATION:  finger-nose-finger, fine finger movements normal  REFLEXES:  deep tendon reflexes present and symmetric  GAIT/STATION:  narrow based gait     DIAGNOSTIC DATA (LABS, IMAGING, TESTING) - I reviewed patient records, labs, notes, testing and imaging myself where available.  Lab Results  Component Value Date   WBC 6.3 08/06/2021   HGB 14.0 08/06/2021   HCT 41.6 08/06/2021   MCV 91.2 08/06/2021   PLT 214 08/06/2021      Component Value Date/Time   NA 139 08/06/2021 1727   K 3.7 08/06/2021 1727   CL 103 08/06/2021 1727   CO2 28 08/06/2021 1727   GLUCOSE 106 (H) 08/06/2021 1727   BUN 10 08/06/2021 1727   CREATININE 0.96 08/06/2021 1727   CALCIUM 8.9 08/06/2021 1727   PROT 6.8 08/06/2021 1727   ALBUMIN 4.1 08/06/2021 1727   AST 16 08/06/2021 1727   ALT 16 08/06/2021 1727   ALKPHOS 82 08/06/2021 1727   BILITOT 0.9 08/06/2021 1727   GFRNONAA >60 08/06/2021 1727   GFRAA >60 09/13/2019 0941   No results found for: CHOL,  HDL, LDLCALC, LDLDIRECT, TRIG, CHOLHDL No results found for: HGBA1C No results found for: VITAMINB12 Lab Results  Component Value Date   TSH 1.373 09/13/2019    09/13/19 MRI brain [I reviewed images myself and agree with interpretation. -VRP]  -Normal MRI brain with contrast -Sinus mucosal disease.  09/13/19 EEG - This study is within normal limits. No seizures or epileptiform discharges were seen throughout the recording.   ASSESSMENT AND PLAN  51 y.o. year old male here with:   Dx:  1. Memory loss   2. Brain fog   3. Attention disturbance   4. Anxiety   5. Insomnia, unspecified type      PLAN:  ATTENTION / COGNITIVE DIFFICULTIES / ANXIETY / INSOMNIA - continue sertraline, lunesta - follow up with psychiatry - consider neuropsychology testing  MILD OSA - follow up with Dr. Annamaria Boots for CPAP  Return for pending if symptoms worsen or fail to improve.  I spent 60 minutes of face-to-face and non-face-to-face time with patient.  This included previsit chart review, lab review, study review, order entry, electronic health record documentation, patient education.     Penni Bombard, MD 11/07/84, 7:61 PM Certified in Neurology, Neurophysiology and Neuroimaging  [facility] [address] [place], [place] [address] [phone]

## 2021-12-12 ENCOUNTER — Telehealth: Payer: Self-pay | Admitting: Internal Medicine

## 2021-12-12 DIAGNOSIS — R0683 Snoring: Secondary | ICD-10-CM

## 2021-12-13 NOTE — Telephone Encounter (Signed)
Called and spoke to patient about HST results. Patient verbalized ok for cpap therapy. Advised him to follow up with [place] in 2 month after starting cpap. Patient verbalized understanding. Nothing further needed

## 2021-12-13 NOTE — Telephone Encounter (Signed)
Called patient this evening and he is wanting to know the results of his home sleep study that was done in march.   Please advise sir

## 2021-12-13 NOTE — Telephone Encounter (Signed)
Sleep study showed obstructive sleep apnea averaging 13 apneas/ hour.  I suggest we try CPAP.  Order- new DME, new CPAP auto 5-20, mask of choice, humidifier, supplies, AirView/ card  Ov in 2 months for f/u per insurance regs

## 2021-12-14 ENCOUNTER — Other Ambulatory Visit: Payer: Self-pay | Admitting: Gastroenterology

## 2021-12-18 ENCOUNTER — Ambulatory Visit: Payer: 59 | Admitting: Diagnostic Neuroimaging

## 2022-01-01 ENCOUNTER — Encounter: Payer: Self-pay | Admitting: Behavioral Health

## 2022-01-01 ENCOUNTER — Ambulatory Visit (INDEPENDENT_AMBULATORY_CARE_PROVIDER_SITE_OTHER): Payer: 59 | Admitting: Behavioral Health

## 2022-01-01 VITALS — BP 115/76 | HR 63 | Ht 67.0 in | Wt 176.0 lb

## 2022-01-01 DIAGNOSIS — F5105 Insomnia due to other mental disorder: Secondary | ICD-10-CM | POA: Diagnosis not present

## 2022-01-01 DIAGNOSIS — F99 Mental disorder, not otherwise specified: Secondary | ICD-10-CM

## 2022-01-01 DIAGNOSIS — F411 Generalized anxiety disorder: Secondary | ICD-10-CM

## 2022-01-01 DIAGNOSIS — F331 Major depressive disorder, recurrent, moderate: Secondary | ICD-10-CM | POA: Diagnosis not present

## 2022-01-01 MED ORDER — SERTRALINE HCL 50 MG PO TABS: 50.0000 mg | ORAL_TABLET | Freq: Every day | ORAL | 1 refills | Status: DC

## 2022-01-01 MED ORDER — TRAZODONE HCL 50 MG PO TABS: 50.0000 mg | ORAL_TABLET | Freq: Every day | ORAL | 1 refills | Status: DC

## 2022-01-01 NOTE — Progress Notes (Signed)
[facility] MD/PA/NP Initial Note  01/01/2022 6:02 PM Raymond Kelly  MRN:  [id number]  Chief Complaint:  Chief Complaint   Anxiety; Depression; Medication Refill; Establish Care     HPI:   "Raymond Kelly", 51 year old male presents to this office for initial visit and to establish care. He says that he is here today because of referral from PCP. Says that he started to develop "brain fog" post covid about 2 years ago. He reports decline in memory. Says that he has developed increased anxiety and some depression that he also believes stems from Covid. He says that it has effected his job and he has become insecure and unsure of himself. He was promoted to Freight forwarder but he is not sure he wants to continue with the added stress. He anxiety had behaviors at home have started to cause some conflict within his marriage. Says that he is paranoid at work that he has made mistakes. He reviews his work multiple times to make sure it is correct. He endorses lack of concentration. Looking back he said that he was insecure as an adolescent and into college years. He suffered from low self esteem and confidence. He feels like he has let his family down. He endorsed lack of concentration on MDQ. PHQ- was negative.  His PCP started him on Sertraline but he admits to not taking correctly. He has been cutting in half and skipping days. He did not take Trazodone but once or twice because he was worried about being addicted. The he tried [place] and also quit the medication after a few days. He says his anxiety today is 4/10 and depression is 2/10. He recently went to vape shop to try CBD, but was shocked today when I told him that he was using Delta 8, synthetic cannabis. He was upset and did not understand the difference. He said that the staff at the shop convinced him that this is what he needed.  He is sleeping better with 6-7 hours per night. No mania, no psychosis, no SI/HI.  No prior medication trials.    Visit Diagnosis:     ICD-10-CM   1. Major depressive disorder, recurrent episode, moderate (HCC)  F33.1 sertraline (ZOLOFT) 50 MG tablet    2. Generalized anxiety disorder  F41.1 sertraline (ZOLOFT) 50 MG tablet    3. Insomnia due to other mental disorder  F51.05 traZODone (DESYREL) 50 MG tablet   F99       Past Psychiatric History: Anxiety, MDD, Reported post covid "brain fog".  Past Medical History:  Past Medical History:  Diagnosis Date   Alopecia areata    Diverticulitis    Dizziness    Family history of adverse reaction to anesthesia    mother slow to awaken   History of kidney stones 2007    Past Surgical History:  Procedure Laterality Date   BIOPSY  09/30/2021   Procedure: BIOPSY;  Surgeon: Doran Stabler, MD;  Location: [facility];  Service: Gastroenterology;;   CHOLECYSTECTOMY N/A 02/01/2016   Procedure: LAPAROSCOPIC CHOLECYSTECTOMY WITH INTRAOPERATIVE CHOLANGIOGRAM;  Surgeon: Coralie Keens, MD;  Location: [facility];  Service: General;  Laterality: N/A;   COLONOSCOPY     COLONOSCOPY WITH PROPOFOL N/A 09/30/2021   Procedure: COLONOSCOPY WITH PROPOFOL;  Surgeon: Doran Stabler, MD;  Location: [facility];  Service: Gastroenterology;  Laterality: N/A;   cyst removed     between his front teeth and cyst removed from abdomal area   ESOPHAGOGASTRODUODENOSCOPY (EGD) WITH PROPOFOL N/A  03/06/2016   Procedure: ESOPHAGOGASTRODUODENOSCOPY (EGD) WITH PROPOFOL;  Surgeon: Doran Stabler, MD;  Location: [facility];  Service: Gastroenterology;  Laterality: N/A;  with stent removal.   GASTROINTESTINAL STENT REMOVAL N/A 03/06/2016   Procedure: GASTROINTESTINAL STENT REMOVAL;  Surgeon: Doran Stabler, MD;  Location: [facility];  Service: Gastroenterology;  Laterality: N/A;   POLYPECTOMY  09/30/2021   Procedure: POLYPECTOMY;  Surgeon: Doran Stabler, MD;  Location: [facility];  Service: Gastroenterology;;   TONSILLECTOMY     ULNAR NERVE TRANSPOSITION     VASECTOMY      Family  Psychiatric History: none reported this visit  Family History:  Family History  Problem Relation Age of Onset   Ulcerative colitis Mother    Irritable bowel syndrome Mother    Hypertension Mother    Stroke Father    Arthritis Father    ALS Father    Neuropathy Father    Hypertension Father    Other Father        "open stomach"   Diabetes Brother    Pancreatic cancer Maternal Grandfather    Juvenile Diabetes Niece    Colon cancer Neg Hx    Rectal cancer Neg Hx    Stomach cancer Neg Hx    Esophageal cancer Neg Hx     Social History:  Social History   Socioeconomic History   Marital status: Married    Spouse name: Raymond Kelly   Number of children: 2   Years of education: 16   Highest education level: Bachelor's degree (e.g., BA, AB, BS)  Occupational History   Occupation: sales  Tobacco Use   Smoking status: Never   Smokeless tobacco: Never  Vaping Use   Vaping Use: Never used  Substance and Sexual Activity   Alcohol use: Not Currently    Comment: social at times   Drug use: No   Sexual activity: Yes  Other Topics Concern   Not on file  Social History Narrative   Lives with wife in [place] Strain: Not on file  Food Insecurity: Not on file  Transportation Needs: Not on file  Physical Activity: Not on file  Stress: Not on file  Social Connections: Not on file    Allergies: No Known Allergies  Metabolic Disorder Labs: No results found for: "HGBA1C", "MPG" No results found for: "PROLACTIN" No results found for: "CHOL", "TRIG", "HDL", "CHOLHDL", "VLDL", "LDLCALC" Lab Results  Component Value Date   TSH 1.373 09/13/2019    Therapeutic Level Labs: No results found for: "LITHIUM" No results found for: "VALPROATE" No results found for: "CBMZ"  Current Medications: Current Outpatient Medications  Medication Sig Dispense Refill   sertraline (ZOLOFT) 50 MG tablet Take 1 tablet (50 mg total) by mouth  daily. 30 tablet 1   traZODone (DESYREL) 50 MG tablet Take 1 tablet (50 mg total) by mouth at bedtime. 30 tablet 1   eszopiclone (LUNESTA) 1 MG TABS tablet Take 2 mg by mouth at bedtime.     sertraline (ZOLOFT) 25 MG tablet Take 1 tablet by mouth daily.     No current facility-administered medications for this visit.    Medication Side Effects: none  Orders placed this visit:  No orders of the defined types were placed in this encounter.   Psychiatric Specialty Exam:  Review of Systems  Constitutional: Negative.   Genitourinary:  Positive for frequency.  Allergic/Immunologic: Negative.   Neurological:  Positive for  dizziness.  Psychiatric/Behavioral:  Positive for dysphoric mood. The patient is nervous/anxious.     Blood pressure 115/76, pulse 63, height '5\' 7"'$  (1.702 m), weight 176 lb (79.8 kg).Body mass index is 27.57 kg/m.  General Appearance: Casual, Neat, and Well Groomed  Eye Contact:  Good  Speech:  Pressured  Volume:  Normal  Mood:  Anxious  Affect:  Appropriate, Congruent, Depressed, Flat, and Anxious  Thought Process:  Coherent  Orientation:  Full (Time, Place, and Person)  Thought Content: Logical   Suicidal Thoughts:  No  Homicidal Thoughts:  No  Memory:  WNL  Judgement:  Good  Insight:  Fair  Psychomotor Activity:  Increased  Concentration:  Concentration: Fair  Recall:  [place] of Knowledge: Fair  Language: Good  Assets:  Desire for Improvement  ADL's:  Intact  Cognition: WNL  Prognosis:  Good   Screenings:  Flowsheet Row Admission (Discharged) from 09/30/2021 in [facility] from 08/06/2021 in [facility] ED from 06/02/2021 in [facility]  C-SSRS RISK CATEGORY No Risk No Risk No Risk       Receiving Psychotherapy: No   Treatment Plan/Recommendations:   Greater than 50% of  60 min face to face time with patient was spent on counseling and coordination of  care. We discussed his current problems with anxiety and depression, and also his post covid brain fog that he reports has continued. We discussed possible sources to continue his care in dealing with symptoms for which diagnostics have not produced any resolve. We also discussed his barriers and causes for non compliance or not taking his prescribed medication correctly. He agreed that he will take medication correctly until next visit so we can appropriately assess needs. I also educated risk on taking Delta 8, 9 or 10 from [place]. I explained that these substance could increase anxiety or paranoia. Reviewed medications, possible side effects, and that it may take 4-6 weeks for the medication to be effective.   This visit we agreed to: Will increase his Zoloft to 50 mg daily To re initiate Trazodone 50 mg at bedtime for sleep.  To report side effects or worsening symptoms promptly  Provided emergency contact information Reviewed PDMP            Elwanda Brooklyn, NP

## 2022-01-03 ENCOUNTER — Other Ambulatory Visit: Payer: Self-pay | Admitting: Behavioral Health

## 2022-01-03 DIAGNOSIS — F411 Generalized anxiety disorder: Secondary | ICD-10-CM

## 2022-01-03 DIAGNOSIS — F331 Major depressive disorder, recurrent, moderate: Secondary | ICD-10-CM

## 2022-01-21 ENCOUNTER — Telehealth: Payer: Self-pay | Admitting: Behavioral Health

## 2022-01-21 NOTE — Telephone Encounter (Signed)
Noted thank you

## 2022-01-21 NOTE — Telephone Encounter (Signed)
Patient called to inform BW that hs has stopped taking the Trazodone '50mg'$  he feels like he sleeps better without it. He also tried to up his dosage for Zoloft but couldn't sleep on such a high dosage. He is currently taking '25mg'$  of the Zoloft. Pls call if needed [phone]

## 2022-01-21 NOTE — Telephone Encounter (Signed)
FYI

## 2022-01-29 ENCOUNTER — Ambulatory Visit: Payer: 59 | Admitting: Behavioral Health

## 2022-02-21 NOTE — Progress Notes (Unsigned)
Cardiology Office Note:   Date:  02/24/2022  NAME:  Raymond Kelly    MRN: [id number] DOB:  06/02/1971   PCP:  Heywood Bene, PA-C  Cardiologist:  None  Electrophysiologist:  None   Referring MD: Heywood Bene, *   Chief Complaint  Patient presents with   Chest Pain    History of Present Illness:   Raymond Kelly is a 51 y.o. male with a hx of depression who is being seen today for the evaluation of dizziness/palpitations at the request of Heywood Bene, PA-C.    He reports for the last 8 months he has a constellation of symptoms including palpitations, dizziness and chest discomfort.  He also reports left arm weakness.  He also reports brain fog.  He has been evaluated by neurology.  Work-up has been negative.  Brain MRI is normal.  He reports he gets daily episodes of heart racing.  Described as flutters.  No identifiable trigger.  Symptoms can last seconds to minutes.  They resolve without intervention.  He also reports exertional chest tightness.  Does not occur with all activity but can occur with heavy exertion.  He had a CT scan of his chest in 2022 that showed no evidence of coronary calcium.  There is no strong history of heart disease.  He reports his mom had heart troubles but she is overweight.  He does not smoke.  No alcohol or drug use.  He is drinking plenty of water.  Reports up to 60 to 80 ounces per day.  He does report he was working in Arch[employer] job.  He reports this was quite stressful.  Symptoms were worse and he has taken a step back at work.  Symptoms are improved.  Currently on Zoloft.  He was on Lunesta and trazodone.  He has stopped these.  He reports his anxiety is well controlled.  He reports he can also get short of breath.  His EKG today is normal.  Thyroid studies are normal.  Hemoglobin values are normal.  CV exam is normal today.  He is married.  He has 2 daughters.  Reports life at home is good.  TSH 1.2 Creatinine 0.98 Hemoglobin  14.5  Past Medical History: Past Medical History:  Diagnosis Date   Alopecia areata    Diverticulitis    Dizziness    Family history of adverse reaction to anesthesia    mother slow to awaken   History of kidney stones 2007    Past Surgical History: Past Surgical History:  Procedure Laterality Date   BIOPSY  09/30/2021   Procedure: BIOPSY;  Surgeon: Doran Stabler, MD;  Location: [facility];  Service: Gastroenterology;;   CHOLECYSTECTOMY N/A 02/01/2016   Procedure: LAPAROSCOPIC CHOLECYSTECTOMY WITH INTRAOPERATIVE CHOLANGIOGRAM;  Surgeon: Coralie Keens, MD;  Location: [facility];  Service: General;  Laterality: N/A;   COLONOSCOPY     COLONOSCOPY WITH PROPOFOL N/A 09/30/2021   Procedure: COLONOSCOPY WITH PROPOFOL;  Surgeon: Doran Stabler, MD;  Location: [facility];  Service: Gastroenterology;  Laterality: N/A;   cyst removed     between his front teeth and cyst removed from abdomal area   ESOPHAGOGASTRODUODENOSCOPY (EGD) WITH PROPOFOL N/A 03/06/2016   Procedure: ESOPHAGOGASTRODUODENOSCOPY (EGD) WITH PROPOFOL;  Surgeon: Doran Stabler, MD;  Location: [facility];  Service: Gastroenterology;  Laterality: N/A;  with stent removal.   GASTROINTESTINAL STENT REMOVAL N/A 03/06/2016   Procedure: GASTROINTESTINAL STENT REMOVAL;  Surgeon: [name],  MD;  Location: [facility];  Service: Gastroenterology;  Laterality: N/A;   POLYPECTOMY  09/30/2021   Procedure: POLYPECTOMY;  Surgeon: Doran Stabler, MD;  Location: [facility];  Service: Gastroenterology;;   TONSILLECTOMY     ULNAR NERVE TRANSPOSITION     VASECTOMY      Current Medications: Current Meds  Medication Sig   sertraline (ZOLOFT) 25 MG tablet Take 1 tablet by mouth daily.     Allergies:    Patient has no known allergies.   Social History: Social History   Socioeconomic History   Marital status: Married    Spouse name: Raymond Kelly   Number of children: 2   Years of education: 16   Highest  education level: Bachelor's degree (e.g., BA, AB, BS)  Occupational History   Occupation: sales  Tobacco Use   Smoking status: Never   Smokeless tobacco: Never  Vaping Use   Vaping Use: Never used  Substance and Sexual Activity   Alcohol use: Not Currently    Comment: social at times   Drug use: No   Sexual activity: Yes  Other Topics Concern   Not on file  Social History Narrative   Lives with wife in [place] Strain: Not on file  Food Insecurity: Not on file  Transportation Needs: Not on file  Physical Activity: Not on file  Stress: Not on file  Social Connections: Not on file     Family History: The patient's family history includes ALS in his father; Arthritis in his father; Diabetes in his brother; Heart attack in his mother; Hypertension in his father and mother; Irritable bowel syndrome in his mother; Juvenile Diabetes in his niece; Neuropathy in his father; Other in his father; Pancreatic cancer in his maternal grandfather; Stroke in his father; Ulcerative colitis in his mother. There is no history of Colon cancer, Rectal cancer, Stomach cancer, or Esophageal cancer.  ROS:   All other ROS reviewed and negative. Pertinent positives noted in the HPI.     EKGs/Labs/Other Studies Reviewed:   The following studies were personally reviewed by me today:  EKG:  EKG is ordered today.  The ekg ordered today demonstrates normal sinus rhythm heart rate 63, no acute ischemic changes or evidence of infarction, and was personally reviewed by me.   Recent Labs: 08/06/2021: ALT 16; BUN 10; Creatinine, Ser 0.96; Hemoglobin 14.0; Platelets 214; Potassium 3.7; Sodium 139   Recent Lipid Panel No results found for: "CHOL", "TRIG", "HDL", "CHOLHDL", "VLDL", "LDLCALC", "LDLDIRECT"  Physical Exam:   VS:  BP 114/82   Pulse 63   Ht '5\' 8"'$  (1.727 m)   Wt 180 lb 9.6 oz (81.9 kg)   SpO2 97%   BMI 27.46 kg/m    Wt Readings from  Last 3 Encounters:  02/24/22 180 lb 9.6 oz (81.9 kg)  12/11/21 175 lb (79.4 kg)  11/28/21 178 lb (80.7 kg)    General: Well nourished, well developed, in no acute distress Head: Atraumatic, normal size  Eyes: PEERLA, EOMI  Neck: Supple, no JVD Endocrine: No thryomegaly Cardiac: Normal S1, S2; RRR; no murmurs, rubs, or gallops Lungs: Clear to auscultation bilaterally, no wheezing, rhonchi or rales  Abd: Soft, nontender, no hepatomegaly  Ext: No edema, pulses 2+ Musculoskeletal: No deformities, BUE and BLE strength normal and equal Skin: Warm and dry, no rashes   Neuro: Alert and oriented to person, place, time, and situation, CNII-XII grossly intact, no focal  deficits  Psych: Normal mood and affect   ASSESSMENT:   JIBRIL MCMINN is a 51 y.o. male who presents for the following: 1. Palpitations   2. Dizziness   3. Precordial pain     PLAN:   1. Palpitations 2. Dizziness 3. Precordial pain -Daily palpitations.  No triggers.  No alleviating factors.  Exam is normal.  EKG is normal.  Recent thyroid studies are normal.  He is not anemic.  He is drinking plenty of water.  Also gets dizzy upon standing.  Unclear what is causing the symptoms.  Could be stress related.  We will proceed with 7-day ZIO to exclude arrhythmia.  Also reports exertional chest discomfort.  We will proceed with an echocardiogram.  I suspect this will be normal.  He had a CT scan of his chest in 2022 that showed no evidence of coronary calcium.  I believe his symptoms are not related to underlying coronary artery disease.  He has a multitude of symptoms which I suspect are likely stress or anxiety related.  Recent brain MRI normal.  We will start with a monitor and echocardiogram.  If any of these are abnormal we will proceed with a coronary CTA.  At this point I believe this is not necessary.  He will see me back as needed based on the results of his scans.  In the meantime he will drink plenty of water.  He will work on  stress reductive strategies.  He should also start to exercise as this will help.  Disposition: Return if symptoms worsen or fail to improve.  Medication Adjustments/Labs and Tests Ordered: Current medicines are reviewed at length with the patient today.  Concerns regarding medicines are outlined above.  Orders Placed This Encounter  Procedures   LONG TERM MONITOR (3-14 DAYS)   EKG 12-Lead   ECHOCARDIOGRAM COMPLETE   No orders of the defined types were placed in this encounter.   Patient Instructions  Medication Instructions:  The current medical regimen is effective;  continue present plan and medications.  *If you need a refill on your cardiac medications before your next appointment, please call your pharmacy*   Testing/Procedures: Echocardiogram - Your physician has requested that you have an echocardiogram. Echocardiography is a painless test that uses sound waves to create images of your heart. It provides your doctor with information about the size and shape of your heart and how well your heart's chambers and valves are working. This procedure takes approximately one hour. There are no restrictions for this procedure.   ZIO XT- Long Term Monitor Instructions  Your physician has requested you wear a ZIO patch monitor for 7 days.  This is a single patch monitor. [employer] supplies one patch monitor per enrollment. Additional stickers are not available. Please do not apply patch if you will be having a Nuclear Stress Test,  Echocardiogram, Cardiac CT, MRI, or Chest Xray during the period you would be wearing the  monitor. The patch cannot be worn during these tests. You cannot remove and re-apply the  ZIO XT patch monitor.  Your ZIO patch monitor will be mailed 3 day USPS to your address on file. It may take 3-5 days  to receive your monitor after you have been enrolled.  Once you have received your monitor, please review the enclosed instructions. Your monitor  has already  been registered assigning a specific monitor serial # to you.  Billing and Patient Assistance Program Information  We have supplied [employer] with any  of your insurance information on file for billing purposes. [employer] offers a sliding scale Patient Assistance Program for patients that do not have  insurance, or whose insurance does not completely cover the cost of the ZIO monitor.  You must apply for the Patient Assistance Program to qualify for this discounted rate.  To apply, please call [employer] at [phone], select option 4, select option 2, ask to apply for  Patient Assistance Program. Theodore Demark will ask your household income, and how many people  are in your household. They will quote your out-of-pocket cost based on that information.  [employer] will also be able to set up a 73-month interest-free payment plan if needed.  Applying the monitor   Shave hair from upper left chest.  Hold abrader disc by orange tab. Rub abrader in 40 strokes over the upper left chest as  indicated in your monitor instructions.  Clean area with 4 enclosed alcohol pads. Let dry.  Apply patch as indicated in monitor instructions. Patch will be placed under collarbone on left  side of chest with arrow pointing upward.  Rub patch adhesive wings for 2 minutes. Remove white label marked "1". Remove the white  label marked "2". Rub patch adhesive wings for 2 additional minutes.  While looking in a mirror, press and release button in center of patch. A small green light will  flash 3-4 times. This will be your only indicator that the monitor has been turned on.  Do not shower for the first 24 hours. You may shower after the first 24 hours.  Press the button if you feel a symptom. You will hear a small click. Record Date, Time and  Symptom in the Patient Logbook.  When you are ready to remove the patch, follow instructions on the last 2 pages of Patient  Logbook. Stick patch monitor onto the last page of Patient  Logbook.  Place Patient Logbook in the blue and white box. Use locking tab on box and tape box closed  securely. The blue and white box has prepaid postage on it. Please place it in the mailbox as  soon as possible. Your physician should have your test results approximately 7 days after the  monitor has been mailed [facility] to [facility]  Call [place]at [phone] you have questions regarding  your ZIO XT patch monitor. Call them immediately if you see an orange light blinking on your  monitor.  If your monitor falls off in less than 4 days, contact our Monitor department at [phone]  If your monitor becomes loose or falls off after 4 days call [employer] at [phone]  suggestions on securing your monitor    Follow-Up: [facility] you and your health needs are our priority.  As part of our continuing mission to provide you with exceptional heart care, we have created designated Provider Care Teams.  These Care Teams include your primary Cardiologist (physician) and Advanced Practice Providers (APPs -  Physician Assistants and Nurse Practitioners) who all work together to provide you with the care you need, when you need it.  We recommend signing up for the patient portal called "MyChart".  Sign up information is provided on this After Visit Summary.  MyChart is used to connect with patients for Virtual Visits (Telemedicine).  Patients are able to view lab/test results, encounter notes, upcoming appointments, etc.  Non-urgent messages can be sent to your provider as well.   To learn more about what you can do with  MyChart, go to [email or web address].    Your next appointment:   As needed  The format for your next appointment:   In Person  Provider:   Eleonore Chiquito, MD             Signed, Addison Naegeli. Audie Box, MD, [facility]  [address], [place], [place] [address] [phone]  02/24/2022 8:58  AM

## 2022-02-24 ENCOUNTER — Ambulatory Visit (INDEPENDENT_AMBULATORY_CARE_PROVIDER_SITE_OTHER): Payer: 59 | Admitting: Cardiovascular Disease

## 2022-02-24 ENCOUNTER — Encounter: Payer: Self-pay | Admitting: Cardiovascular Disease

## 2022-02-24 ENCOUNTER — Ambulatory Visit (INDEPENDENT_AMBULATORY_CARE_PROVIDER_SITE_OTHER): Payer: 59

## 2022-02-24 ENCOUNTER — Ambulatory Visit: Payer: 59 | Admitting: Internal Medicine

## 2022-02-24 VITALS — BP 114/82 | HR 63 | Ht 68.0 in | Wt 180.6 lb

## 2022-02-24 DIAGNOSIS — R002 Palpitations: Secondary | ICD-10-CM

## 2022-02-24 DIAGNOSIS — R072 Precordial pain: Secondary | ICD-10-CM | POA: Diagnosis not present

## 2022-02-24 DIAGNOSIS — R42 Dizziness and giddiness: Secondary | ICD-10-CM | POA: Diagnosis not present

## 2022-02-24 NOTE — Patient Instructions (Signed)
Medication Instructions:  The current medical regimen is effective;  continue present plan and medications.  *If you need a refill on your cardiac medications before your next appointment, please call your pharmacy*   Testing/Procedures: Echocardiogram - Your physician has requested that you have an echocardiogram. Echocardiography is a painless test that uses sound waves to create images of your heart. It provides your doctor with information about the size and shape of your heart and how well your heart's chambers and valves are working. This procedure takes approximately one hour. There are no restrictions for this procedure.   ZIO XT- Long Term Monitor Instructions  Your physician has requested you wear a ZIO patch monitor for 7 days.  This is a single patch monitor. [employer] supplies one patch monitor per enrollment. Additional stickers are not available. Please do not apply patch if you will be having a Nuclear Stress Test,  Echocardiogram, Cardiac CT, MRI, or Chest Xray during the period you would be wearing the  monitor. The patch cannot be worn during these tests. You cannot remove and re-apply the  ZIO XT patch monitor.  Your ZIO patch monitor will be mailed 3 day USPS to your address on file. It may take 3-5 days  to receive your monitor after you have been enrolled.  Once you have received your monitor, please review the enclosed instructions. Your monitor  has already been registered assigning a specific monitor serial # to you.  Billing and Patient Assistance Program Information  We have supplied [employer] with any of your insurance information on file for billing purposes. [employer] offers a sliding scale Patient Assistance Program for patients that do not have  insurance, or whose insurance does not completely cover the cost of the ZIO monitor.  You must apply for the Patient Assistance Program to qualify for this discounted rate.  To apply, please call [employer] at [phone],  select option 4, select option 2, ask to apply for  Patient Assistance Program. Theodore Demark will ask your household income, and how many people  are in your household. They will quote your out-of-pocket cost based on that information.  [employer] will also be able to set up a 72-month interest-free payment plan if needed.  Applying the monitor   Shave hair from upper left chest.  Hold abrader disc by orange tab. Rub abrader in 40 strokes over the upper left chest as  indicated in your monitor instructions.  Clean area with 4 enclosed alcohol pads. Let dry.  Apply patch as indicated in monitor instructions. Patch will be placed under collarbone on left  side of chest with arrow pointing upward.  Rub patch adhesive wings for 2 minutes. Remove white label marked "1". Remove the white  label marked "2". Rub patch adhesive wings for 2 additional minutes.  While looking in a mirror, press and release button in center of patch. A small green light will  flash 3-4 times. This will be your only indicator that the monitor has been turned on.  Do not shower for the first 24 hours. You may shower after the first 24 hours.  Press the button if you feel a symptom. You will hear a small click. Record Date, Time and  Symptom in the Patient Logbook.  When you are ready to remove the patch, follow instructions on the last 2 pages of Patient  Logbook. Stick patch monitor onto the last page of Patient Logbook.  Place Patient Logbook in the blue and white box. Use locking tab on  box and tape box closed  securely. The blue and white box has prepaid postage on it. Please place it in the mailbox as  soon as possible. Your physician should have your test results approximately 7 days after the  monitor has been mailed back to [facility].  Call [place] at [phone] if you have questions regarding  your ZIO XT patch monitor. Call them immediately if you see an orange light blinking on your   monitor.  If your monitor falls off in less than 4 days, contact our Monitor department at [phone].  If your monitor becomes loose or falls off after 4 days call [employer] at [phone] for  suggestions on securing your monitor    Follow-Up: At [facility], you and your health needs are our priority.  As part of our continuing mission to provide you with exceptional heart care, we have created designated Provider Care Teams.  These Care Teams include your primary Cardiologist (physician) and Advanced Practice Providers (APPs -  Physician Assistants and Nurse Practitioners) who all work together to provide you with the care you need, when you need it.  We recommend signing up for the patient portal called "MyChart".  Sign up information is provided on this After Visit Summary.  MyChart is used to connect with patients for Virtual Visits (Telemedicine).  Patients are able to view lab/test results, encounter notes, upcoming appointments, etc.  Non-urgent messages can be sent to your provider as well.   To learn more about what you can do with MyChart, go to [email or web address].    Your next appointment:   As needed  The format for your next appointment:   In Person  Provider:   Eleonore Chiquito, MD

## 2022-02-24 NOTE — Progress Notes (Unsigned)
ZIO XT mailed to pt's home address. 

## 2022-02-26 DIAGNOSIS — R002 Palpitations: Secondary | ICD-10-CM | POA: Diagnosis not present

## 2022-03-03 ENCOUNTER — Ambulatory Visit (INDEPENDENT_AMBULATORY_CARE_PROVIDER_SITE_OTHER): Payer: 59 | Admitting: Behavioral Health

## 2022-03-03 ENCOUNTER — Encounter: Payer: Self-pay | Admitting: Behavioral Health

## 2022-03-03 DIAGNOSIS — F99 Mental disorder, not otherwise specified: Secondary | ICD-10-CM

## 2022-03-03 DIAGNOSIS — F331 Major depressive disorder, recurrent, moderate: Secondary | ICD-10-CM

## 2022-03-03 DIAGNOSIS — F5105 Insomnia due to other mental disorder: Secondary | ICD-10-CM | POA: Diagnosis not present

## 2022-03-03 DIAGNOSIS — F411 Generalized anxiety disorder: Secondary | ICD-10-CM

## 2022-03-03 MED ORDER — SERTRALINE HCL 50 MG PO TABS: 25.0000 mg | ORAL_TABLET | Freq: Every day | ORAL | 0 refills | Status: DC

## 2022-03-03 NOTE — Progress Notes (Signed)
[facility]  Patient ID: Raymond Kelly,  MRN: [id number]  PCP: Heywood Bene, PA-C  Date of Evaluation: 03/03/2022 Time spent:20 minutes  Chief Complaint:  Chief Complaint   Depression; Anxiety; Fatigue; Follow-up; Medication Refill; Patient Education     HISTORY/CURRENT STATUS: HPI  "Raymond Kelly", 51 year old male presents to this office for initial visit and to establish care. He is quiet and reserved today but says that he felt  Zoloft 50 mg was too much so he dropped back to 25. Says he is still under pressure at work until they hire someone to relieve him. He says his anxiety today is 2/10 and depression is 2/10. He stopped using the vape that he discovered had THC in it and not touched it since. Says he has not needed the Trazodone and wanted it taken off his list.   He is sleeping better with 6-7 hours per night. No mania, no psychosis, no SI/HI.   No prior medication trials.     Individual Medical History/ Review of Systems: Changes? :No   Allergies: Patient has no known allergies.  Current Medications:  Current Outpatient Medications:    eszopiclone (LUNESTA) 1 MG TABS tablet, Take 2 mg by mouth at bedtime. (Patient not taking: Reported on 02/24/2022), Disp: , Rfl:    sertraline (ZOLOFT) 25 MG tablet, Take 1 tablet by mouth daily., Disp: , Rfl:    sertraline (ZOLOFT) 50 MG tablet, Take 0.5 tablets (25 mg total) by mouth daily., Disp: 90 tablet, Rfl: 0 Medication Side Effects: none  Family Medical/ Social History: Changes? No  MENTAL HEALTH EXAM:  There were no vitals taken for this visit.There is no height or weight on file to calculate BMI.  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 Generalized anxiety disorder  F41.1 sertraline (ZOLOFT) 50 MG tablet    3. Major depressive disorder, recurrent episode, moderate (HCC)  F33.1 sertraline (ZOLOFT) 50 MG tablet      Receiving Psychotherapy: No    RECOMMENDATIONS:  Greater than 50% of 30 min face to face time with patient was spent on counseling and coordination of care.He does not want to adjust his medications at this time. He is following up with cardiologist for palpitations.Marland Kitchen  He is requesting follow up after the holidays.  This visit we agreed to: To continue  Zoloft to 25 mg daily Pt wanted to stop Trazodone 50 mg at bedtime for sleep.  To report side effects or worsening symptoms promptly  Provided emergency contact information Reviewed PDMP    Elwanda Brooklyn, NP

## 2022-03-12 ENCOUNTER — Ambulatory Visit (INDEPENDENT_AMBULATORY_CARE_PROVIDER_SITE_OTHER): Payer: 59

## 2022-03-12 DIAGNOSIS — R072 Precordial pain: Secondary | ICD-10-CM

## 2022-03-12 LAB — ECHOCARDIOGRAM COMPLETE
Area-P 1/2: 3.65 cm2
S' Lateral: 3.12 cm

## 2022-03-23 NOTE — Progress Notes (Unsigned)
HPI M never smoker followed for OSA, complicated by  Insomnia, Alopecia, Hyperbilirubinemia, hx kidney stones, Diverticulosis, Covid infection [id number], Depression,  HST 09/12/21- AHI 13.1/ hr, desaturation to 86%, body weight 174 lbs  =============================================================  07/09/21- 76 yoM never smoker for sleep evaluation courtesy of Clyde Lundborg, PA-C with concern of OSA Medical problem list includes Alopecia, Hyperbilirubinemia, hx kidney stones, Diverticulosis, Covid infection [id number],  Epworth score-15 Body weight today-174 lbs Covid vax-no Flu vax-no Wife has recorded and videoed him stopping breathing.  Mother has CPAP. He is up to bathroom 4-5 times per night and has seen urologist.  Refers to work stress that keeps his mind busy at night.  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  Melatonin did not help sleep quality.  Benadryl carried over too long. ENT surgery for tonsils.  Denies heart or lung disease.  Denies complex parasomnias. Described brain fog after 2 episodes Covid. CTa chest/abd/pelvis 06/02/21- IMPRESSION: 1. No acute abnormality in the chest, abdomen, or pelvis. Specifically, no acute aortic pathology or pulmonary embolism. 2. Resolution of sigmoid diverticulitis seen on prior CT 05/03/2021.  03/25/22- 51 yoM never smoker followed for OSA, complicated by  Insomnia, Alopecia, Hyperbilirubinemia, hx kidney stones, Diverticulosis, Covid infection [id number], Depression,  HST 09/12/21- AHI 13.1/ hr, desaturation to 86%, body weight 174 lbs CPAP auto 5-20/ [employer] AirSense 10 AutoSet  new 12/13/21 Download compliance 80%, AHI 3.4/ hr      (range 8.4-14.7) Had Cardiology eval for tachypalpitation. Body weight today-180 lbs Covid vax- Flu vax-declines -----Pt f/u he feels like the machine is giving too much pressure, getting approx 7hrs/night of sleep. We discussed pressure, alternatives to CPAP and medical reasons for treating  OSA at all with his scores. We will reduce pressure and see how he feels with that.  ROS-see HPI   + = positive Constitutional:    weight loss, night sweats, fevers, chills, fatigue, lassitude. HEENT:    headaches, difficulty swallowing, tooth/dental problems, sore throat,       sneezing, itching, ear ache, +nasal congestion, post nasal drip, snoring CV:    chest pain, orthopnea, PND, swelling in lower extremities, anasarca,                                   dizziness, palpitations Resp:   shortness of breath with exertion or at rest.                +productive cough,   non-productive cough, coughing up of blood.              change in color of mucus.  wheezing.   Skin:    rash or lesions. GI:  No-   heartburn, indigestion, +abdominal pain, nausea, vomiting, diarrhea,                 change in bowel habits, loss of appetite GU: dysuria, change in color of urine, no urgency or frequency.   flank pain. MS:   joint pain, stiffness, decreased range of motion, back pain. Neuro-     nothing unusual Psych:  change in mood or affect.  +depression or +anxiety.   memory loss.  OBJ- Physical Exam General- Alert, Oriented, Affect-appropriate, Distress- none acute, not obese Skin- rash-none, lesions- none, excoriation- none      Lymphadenopathy- none Head- atraumatic            Eyes- Gross vision intact, PERRLA,  conjunctivae and secretions clear            Ears- Hearing, canals-normal            Nose- Clear, no-Septal dev, mucus, polyps, erosion, perforation             Throat- Mallampati III ,  drainage- none, tonsils absent, + teeth Neck- flexible , trachea midline, no stridor , thyroid nl, carotid no bruit Chest - symmetrical excursion , unlabored           Heart/CV- RRR , no murmur , no gallop  , no rub, nl s1 s2                           - JVD- none , edema- none, stasis changes- none, varices- none           Lung- clear to P&A, wheeze- none, cough- none , dullness-none, rub- none            Chest wall-  Abd-  Br/ Gen/ Rectal- Not done, not indicated Extrem- cyanosis- none, clubbing, none, atrophy- none, strength- nl Neuro- grossly intact to observation

## 2022-03-25 ENCOUNTER — Encounter: Payer: Self-pay | Admitting: Internal Medicine

## 2022-03-25 ENCOUNTER — Ambulatory Visit (INDEPENDENT_AMBULATORY_CARE_PROVIDER_SITE_OTHER): Payer: 59 | Admitting: Internal Medicine

## 2022-03-25 VITALS — BP 112/74 | HR 65 | Ht 68.0 in | Wt 180.2 lb

## 2022-03-25 DIAGNOSIS — F5101 Primary insomnia: Secondary | ICD-10-CM

## 2022-03-25 DIAGNOSIS — G4733 Obstructive sleep apnea (adult) (pediatric): Secondary | ICD-10-CM

## 2022-03-25 NOTE — Patient Instructions (Addendum)
Order- DME [employer] [employer]   please change autopap range to 5-12, continue mask of choice, humidifier, supplies. AirView/ card  Please call as needed

## 2022-03-25 NOTE — Assessment & Plan Note (Signed)
Blames CPAP pressure for some waking during the night.  We will see how he does with lower pressure.

## 2022-03-25 NOTE — Assessment & Plan Note (Signed)
Benefits from CPAP with good control but he's still tentative about it. Plan- reduce auto range to 5-12

## 2022-04-04 ENCOUNTER — Other Ambulatory Visit: Payer: Self-pay | Admitting: Behavioral Health

## 2022-04-04 DIAGNOSIS — F331 Major depressive disorder, recurrent, moderate: Secondary | ICD-10-CM

## 2022-04-04 DIAGNOSIS — F411 Generalized anxiety disorder: Secondary | ICD-10-CM

## 2022-06-18 ENCOUNTER — Telehealth: Payer: Self-pay | Admitting: Behavioral Health

## 2022-06-18 NOTE — Telephone Encounter (Signed)
Next visit is 07/14/22. Raymond Kelly called and wants to wean off of Zoloft. Could someone please call him and give him directions on how to do it? His phone number is [phone].

## 2022-06-19 NOTE — Telephone Encounter (Signed)
Spoke to pt and he stated he will wait until appt

## 2022-07-14 ENCOUNTER — Ambulatory Visit: Payer: 59 | Admitting: Behavioral Health

## 2022-08-13 ENCOUNTER — Telehealth: Payer: Self-pay | Admitting: Behavioral Health

## 2022-08-13 NOTE — Telephone Encounter (Signed)
Pt called canc 2/9 apt. Weaned off med. Not taking anymore.

## 2022-08-15 ENCOUNTER — Ambulatory Visit: Payer: 59 | Admitting: Behavioral Health

## 2022-09-26 ENCOUNTER — Other Ambulatory Visit: Payer: Self-pay | Admitting: Behavioral Health

## 2022-09-26 DIAGNOSIS — F411 Generalized anxiety disorder: Secondary | ICD-10-CM

## 2022-09-26 DIAGNOSIS — F331 Major depressive disorder, recurrent, moderate: Secondary | ICD-10-CM

## 2022-10-30 ENCOUNTER — Emergency Department (HOSPITAL_BASED_OUTPATIENT_CLINIC_OR_DEPARTMENT_OTHER): Payer: 59 | Admitting: Radiology

## 2022-10-30 ENCOUNTER — Encounter (HOSPITAL_BASED_OUTPATIENT_CLINIC_OR_DEPARTMENT_OTHER): Payer: Self-pay

## 2022-10-30 ENCOUNTER — Other Ambulatory Visit: Payer: Self-pay

## 2022-10-30 ENCOUNTER — Emergency Department (HOSPITAL_BASED_OUTPATIENT_CLINIC_OR_DEPARTMENT_OTHER)
Admission: EM | Admit: 2022-10-30 | Discharge: 2022-10-31 | Disposition: A | Discharge status: Home / Self Care | Payer: 59 | Attending: Emergency Medicine | Admitting: Emergency Medicine

## 2022-10-30 DIAGNOSIS — Z7901 Long term (current) use of anticoagulants: Secondary | ICD-10-CM | POA: Insufficient documentation

## 2022-10-30 DIAGNOSIS — I4891 Unspecified atrial fibrillation: Secondary | ICD-10-CM

## 2022-10-30 DIAGNOSIS — R0789 Other chest pain: Secondary | ICD-10-CM

## 2022-10-30 DIAGNOSIS — I480 Paroxysmal atrial fibrillation: Secondary | ICD-10-CM | POA: Insufficient documentation

## 2022-10-30 LAB — TROPONIN I (HIGH SENSITIVITY): Troponin I (High Sensitivity): 9 ng/L (ref <18)

## 2022-10-30 LAB — BASIC METABOLIC PANEL
Anion gap: 7 (ref 5–15)
BUN: 23 mg/dL — ABNORMAL HIGH (ref 6–20)
CO2: 27 mmol/L (ref 22–32)
Calcium: 9.1 mg/dL (ref 8.9–10.3)
Chloride: 109 mmol/L (ref 98–111)
Creatinine, Ser: 1.35 mg/dL — ABNORMAL HIGH (ref 0.61–1.24)
GFR, Estimated: >60 mL/min (ref >60)
Glucose, Bld: 101 mg/dL — ABNORMAL HIGH (ref 70–99)
Potassium: 3.9 mmol/L (ref 3.5–5.1)
Sodium: 143 mmol/L (ref 135–145)

## 2022-10-30 LAB — CBC
HCT: 44.8 % (ref 39.0–52.0)
Hemoglobin: 15.3 g/dL (ref 13.0–17.0)
MCH: 31.9 pg (ref 26.0–34.0)
MCHC: 34.2 g/dL (ref 30.0–36.0)
MCV: 93.5 fL (ref 80.0–100.0)
Platelets: 221 10*3/uL (ref 150–400)
RBC: 4.79 MIL/uL (ref 4.22–5.81)
RDW: 12.2 % (ref 11.5–15.5)
WBC: 6 10*3/uL (ref 4.0–10.5)
nRBC: 0 % (ref 0.0–0.2)

## 2022-10-30 MED ORDER — APIXABAN 2.5 MG PO TABS
5.0000 mg | ORAL_TABLET | Freq: Once | ORAL | Status: AC
  Administered 2022-10-31: 5 mg via ORAL
  Filled 2022-10-30: qty 2

## 2022-10-30 MED ORDER — APIXABAN 5 MG PO TABS: 5.0000 mg | ORAL_TABLET | Freq: Two times a day (BID) | ORAL | 1 refills | Status: DC

## 2022-10-30 NOTE — ED Notes (Signed)
Pt transported to xray 

## 2022-10-30 NOTE — ED Triage Notes (Signed)
Chest pain with exertion 6/10 now resolved. Currently c/o palpitations denies dyspnea  afib on  monitor

## 2022-10-30 NOTE — ED Provider Notes (Signed)
[place] EMERGENCY DEPARTMENT AT [facility] Provider Note  CSN: [id number] Arrival date & time: 10/30/22 2124  Chief Complaint(s) Chest Pain (Chest pain with exertion 6/10 now resolved. Currently c/o palpitations denies dyspnea) and Palpitations (Chest pain upon exercise 2 hours pta, cp is now resolved. C/o feeling palpitations afib noted on monitor  )  HPI Raymond Kelly is a 52 y.o. male with a past medical history listed below who presents to the emergency department with exertional chest pain and tachycardia.  Patient reports that he was out on a walk and decided to jog.  He reports getting approximately 31 m and then he began having chest tightness and palpitations.  No associated shortness of breath. Chest pain resolved at rest. The wife, who is a Engineer, civil (consulting), took the patient's pulse and noted that it was irregularly irregular at a high rate.  He denies any prior history of atrial fibrillation.  Patient is currently asymptomatic at this time and does not feel any palpitations.  She denies any recent fevers or infections.  No cough or congestion.  No other physical complaints.  The history is provided by the patient and the spouse.    Past Medical History Past Medical History:  Diagnosis Date   Alopecia areata    Diverticulitis    Dizziness    Family history of adverse reaction to anesthesia    mother slow to awaken   History of kidney stones 2007   Patient Active Problem List   Diagnosis Date Noted   OSA (obstructive sleep apnea) 07/09/2021   Insomnia 07/09/2021   Alopecia areata 08/28/2016   Hyperbilirubinemia 02/04/2016   Bile leak, postoperative    Home Medication(s) Prior to Admission medications   Medication Sig Start Date End Date Taking? Authorizing Provider  apixaban (ELIQUIS) 5 MG TABS tablet Take 1 tablet (5 mg total) by mouth 2 (two) times daily. 10/30/22 12/29/22 Yes Rollyn Scialdone, Amadeo Garnet, MD  diltiazem (CARDIZEM) 30 MG tablet Take 1 tablet (30 mg total) by  mouth 2 (two) times daily as needed for up to 20 doses (for HR > 110). 10/31/22  Yes Leauna Sharber, Amadeo Garnet, MD  sertraline (ZOLOFT) 25 MG tablet Take 1 tablet by mouth daily. 11/20/21   [provider]  Testosterone Undecanoate 100 MG CAPS Take 100 mg by mouth daily.    [provider]  simethicone (MYLICON) 80 MG chewable tablet Chew 80-160 mg by mouth every 6 (six) hours as needed for flatulence.  09/07/19  [provider]                                                                                                                                    Allergies Patient has no known allergies.  Review of Systems Review of Systems As noted in HPI  Physical Exam Vital Signs  I have reviewed the triage vital signs BP (!) 128/94 (BP Location: Left Arm)   Pulse  93   Temp 98.2 F (36.8 C) (Oral)   Resp (!) 23   Ht 5\' 8"  (1.727 m)   Wt 84.8 kg   SpO2 96%   BMI 28.43 kg/m   Physical Exam Vitals reviewed.  Constitutional:      General: He is not in acute distress.    Appearance: He is well-developed. He is not diaphoretic.  HENT:     Head: Normocephalic and atraumatic.     Nose: Nose normal.  Eyes:     General: No scleral icterus.       Right eye: No discharge.        Left eye: No discharge.     Conjunctiva/sclera: Conjunctivae normal.     Pupils: Pupils are equal, round, and reactive to light.  Cardiovascular:     Rate and Rhythm: Normal rate. Rhythm irregularly irregular.     Heart sounds: No murmur heard.    No friction rub. No gallop.  Pulmonary:     Effort: Pulmonary effort is normal. No respiratory distress.     Breath sounds: Normal breath sounds. No stridor. No rales.  Abdominal:     General: There is no distension.     Palpations: Abdomen is soft.     Tenderness: There is no abdominal tenderness.  Musculoskeletal:        General: No tenderness.     Cervical back: Normal range of motion and neck supple.  Skin:    General: Skin is warm and  dry.     Findings: No erythema or rash.  Neurological:     Mental Status: He is alert and oriented to person, place, and time.     ED Results and Treatments Labs (all labs ordered are listed, but only abnormal results are displayed) Labs Reviewed  BASIC METABOLIC PANEL - Abnormal; Notable for the following components:      Result Value   Glucose, Bld 101 (*)    BUN 23 (*)    Creatinine, Ser 1.35 (*)    All other components within normal limits  CBC  MAGNESIUM  TROPONIN I (HIGH SENSITIVITY)  TROPONIN I (HIGH SENSITIVITY)                                                                                                                         EKG  EKG Interpretation  Date/Time:  Thursday October 30 2022 21:37:14 EDT Ventricular Rate:  97 PR Interval:    QRS Duration: 98 QT Interval:  343 QTC Calculation: 436 R Axis:   49 Text Interpretation: Atrial fibrillation Confirmed by Drema Pry [phone]) on 10/30/2022 11:38:47 PM       Radiology DG Chest 2 View  Result Date: 10/30/2022 CLINICAL DATA:  Chest pain EXAM: CHEST - 2 VIEW COMPARISON:  06/02/2021 FINDINGS: The heart size and mediastinal contours are within normal limits. Both lungs are clear. The visualized skeletal structures are unremarkable. IMPRESSION: Normal study. Electronically Signed   By: Charlett Nose M.D.  On: 10/30/2022 22:13    Medications Ordered in ED Medications  apixaban (ELIQUIS) tablet 5 mg (has no administration in time range)                                                                                                                                     Procedures Procedures  (including critical care time)  Medical Decision Making / ED Course  Click here for ABCD2, HEART and other calculators  Medical Decision Making Amount and/or Complexity of Data Reviewed Labs: ordered. Decision-making details documented in ED Course. Radiology: ordered and independent interpretation performed.  Decision-making details documented in ED Course. ECG/medicine tests: ordered and independent interpretation performed. Decision-making details documented in ED Course.  Risk Prescription drug management.    This patient presents to the ED for concern of chest pain, this involves an extensive number of treatment options, and is a complaint that carries with it a high risk of complications and morbidity. The differential diagnosis includes but not limited to A-fib RVR, ACS.  Less suspicious for pulmonary embolism, aortic dissection.  With the workup will rule out pneumonia, pneumothorax, pulmonary edema.  Doubt heart failure  Initial intervention:  N/a  Work up: [employer] and/or imaging independently interpreted by me) EKG in atrial fibrillation without any acute ischemic changes.  He is currently rate controlled CBC without leukocytosis or anemia Metabolic panel without significant electrolyte derangements.  Mild renal insufficiency without evidence of AKI.  Last creatinine was 1.1 two weeks ago. Initial troponin negative Delta troponin negative Chest x-ray without evidence of pneumonia, pneumothorax, pulmonary edema or pleural effusion  Reassessment: Workup favors new onset A-fib RVR.  Unable to determine when the patient began having atrial fibrillation since he is symptomatic at this time. He is not a candidate for cardioversion in the emergency department We will plan to start patient on Eliquis Consulted cardiology, Dr. Hyacinth Meeker, regarding diltiazem dose in case patient's rate increases.  They recommended 30 mg IR. Rest of the workup is reassuring.  Not favoring ACS.      Final Clinical Impression(s) / ED Diagnoses Final diagnoses:  Atrial fibrillation, unspecified type (HCC)  Chest discomfort   The patient appears reasonably screened and/or stabilized for discharge and I doubt any other medical condition or other [facility] requiring further screening, evaluation, or treatment in the ED  at this time. I have discussed the findings, Dx and Tx plan with the patient/family who expressed understanding and agree(s) with the plan. Discharge instructions discussed at length. The patient/family was given strict return precautions who verbalized understanding of the instructions. No further questions at time of discharge.  Disposition: Discharge  Condition: Good  ED Discharge Orders          Ordered    diltiazem (CARDIZEM) 30 MG tablet  2 times daily PRN        10/31/22 0039    Amb referral to AFIB Clinic  10/30/22 2349    apixaban (ELIQUIS) 5 MG TABS tablet  2 times daily        10/30/22 2349              Follow Up: Roger Kill, PA-C [address] [place] [address] [phone]  Call  as needed  [facility] Atrial Fibrillation Clinic at [facility] [address] [id number] Wilhemina Bonito [place] [address] [phone] Call  if you have not been called about your appointment, in 3-5 days           This chart was dictated using voice recognition software.  Despite best efforts to proofread,  errors can occur which can change the documentation meaning.    Nira Conn, MD 10/31/22 [phone]

## 2022-10-31 LAB — TROPONIN I (HIGH SENSITIVITY): Troponin I (High Sensitivity): 17 ng/L (ref <18)

## 2022-10-31 LAB — MAGNESIUM: Magnesium: 2.1 mg/dL (ref 1.7–2.4)

## 2022-10-31 MED ORDER — DILTIAZEM HCL 30 MG PO TABS: 30.0000 mg | ORAL_TABLET | Freq: Two times a day (BID) | ORAL | 0 refills | Status: DC | PRN

## 2022-11-04 ENCOUNTER — Ambulatory Visit (HOSPITAL_COMMUNITY)
Admission: RE | Admit: 2022-11-04 | Discharge: 2022-11-04 | Disposition: A | Discharge status: Home / Self Care | Payer: 59 | Source: Ambulatory Visit | Attending: Internal Medicine | Admitting: Internal Medicine

## 2022-11-04 ENCOUNTER — Encounter (HOSPITAL_COMMUNITY): Payer: Self-pay | Admitting: Internal Medicine

## 2022-11-04 VITALS — BP 122/80 | HR 63 | Ht 68.0 in | Wt 194.0 lb

## 2022-11-04 DIAGNOSIS — G4700 Insomnia, unspecified: Secondary | ICD-10-CM | POA: Diagnosis not present

## 2022-11-04 DIAGNOSIS — E669 Obesity, unspecified: Secondary | ICD-10-CM | POA: Insufficient documentation

## 2022-11-04 DIAGNOSIS — Z79899 Other long term (current) drug therapy: Secondary | ICD-10-CM | POA: Diagnosis not present

## 2022-11-04 DIAGNOSIS — Z6829 Body mass index (BMI) 29.0-29.9, adult: Secondary | ICD-10-CM | POA: Diagnosis not present

## 2022-11-04 DIAGNOSIS — R079 Chest pain, unspecified: Secondary | ICD-10-CM | POA: Insufficient documentation

## 2022-11-04 DIAGNOSIS — G4733 Obstructive sleep apnea (adult) (pediatric): Secondary | ICD-10-CM | POA: Insufficient documentation

## 2022-11-04 DIAGNOSIS — I4891 Unspecified atrial fibrillation: Secondary | ICD-10-CM

## 2022-11-04 DIAGNOSIS — Z7901 Long term (current) use of anticoagulants: Secondary | ICD-10-CM | POA: Insufficient documentation

## 2022-11-04 DIAGNOSIS — I480 Paroxysmal atrial fibrillation: Secondary | ICD-10-CM | POA: Insufficient documentation

## 2022-11-04 HISTORY — DX: Paroxysmal atrial fibrillation: I48.0

## 2022-11-04 NOTE — Patient Instructions (Signed)
Stop Eliquis

## 2022-11-04 NOTE — Progress Notes (Signed)
Primary Care Physician: Bernita Buffy Primary Cardiologist: Dr. Flora Lipps Primary Electrophysiologist: None Referring Physician: ED at [facility] is a 52 y.o. male with a history of OSA, insomnia, and atrial fibrillation who presents for consultation in the [facility] Atrial Fibrillation Clinic. The patient was initially diagnosed with atrial fibrillation on 10/30/22 after presenting to ED with symptoms of chest pain and palpitations that occurred while jogging. He was discharged on Eliquis 5 mg BID and diltiazem 30 mg prn. History of being seen by Dr. Flora Lipps for palpitations on 02/24/22 with monitor showing brief SVT (7 episodes; longest 5.9 sec) and normal echocardiogram. Patient is on Eliquis 5 mg BID for a CHADS2VASC score of 0.  On evaluation today, he is currently in SR. He felt chest pressure and pain and heart pounding on day he jogged that led to ED evaluation. The chest pressure was somewhat relieved after stopping exercise. He continued to feel heart fluttering which led to ED evaluation. He still feels intermittent fluttering but not wearing his Apple Watch. He wore it on 4/26 and 4/27; I can see sinus rhythm strips on his phone app. He notes the fluttering he feels now intermittently is similar to that felt in August which led to cardiac monitor. Admits to a lot of stress going on recently. He still notes intermittently feeling disoriented at work (wife notes this is at home sometimes too) with previously negative Neurology workup.   He is compliant with his CPAP.   He is compliant with anticoagulation and has not missed any doses. He has no bleeding concerns.  Today, he denies symptoms of palpitations, chest pain, shortness of breath, orthopnea, PND, lower extremity edema, dizziness, presyncope, syncope, snoring, daytime somnolence, bleeding, or neurologic sequela. The patient is tolerating medications without difficulties and is otherwise without  complaint today.   Atrial Fibrillation Risk Factors:  he does have symptoms or diagnosis of sleep apnea. he is compliant with CPAP therapy. he does not have a history of rheumatic fever. he does not have a history of alcohol use. The patient does not have a history of early familial atrial fibrillation or other arrhythmias.  he has a BMI of Body mass index is 29.5 kg/m.Marland Kitchen Filed Weights   11/04/22 1526  Weight: 88 kg    Family History  Problem Relation Age of Onset   Heart attack Mother    Ulcerative colitis Mother    Irritable bowel syndrome Mother    Hypertension Mother    Stroke Father    Arthritis Father    ALS Father    Neuropathy Father    Hypertension Father    Other Father        "open stomach"   Diabetes Brother    Pancreatic cancer Maternal Grandfather    Juvenile Diabetes Niece    Colon cancer Neg Hx    Rectal cancer Neg Hx    Stomach cancer Neg Hx    Esophageal cancer Neg Hx      Atrial Fibrillation Management history:  Previous antiarrhythmic drugs: None Previous cardioversions: None Previous ablations: None Anticoagulation history: Eliquis 5 mg BID   Past Medical History:  Diagnosis Date   Alopecia areata    Diverticulitis    Dizziness    Family history of adverse reaction to anesthesia    mother slow to awaken   History of kidney stones 2007   Past Surgical History:  Procedure Laterality Date   BIOPSY  09/30/2021  Procedure: BIOPSY;  Surgeon: Sherrilyn Rist, MD;  Location: [facility];  Service: Gastroenterology;;   CHOLECYSTECTOMY N/A 02/01/2016   Procedure: LAPAROSCOPIC CHOLECYSTECTOMY WITH INTRAOPERATIVE CHOLANGIOGRAM;  Surgeon: Abigail Miyamoto, MD;  Location: [facility] OR;  Service: General;  Laterality: N/A;   COLONOSCOPY     COLONOSCOPY WITH PROPOFOL N/A 09/30/2021   Procedure: COLONOSCOPY WITH PROPOFOL;  Surgeon: Sherrilyn Rist, MD;  Location: [facility];  Service: Gastroenterology;  Laterality: N/A;   cyst removed      between his front teeth and cyst removed from abdomal area   ESOPHAGOGASTRODUODENOSCOPY (EGD) WITH PROPOFOL N/A 03/06/2016   Procedure: ESOPHAGOGASTRODUODENOSCOPY (EGD) WITH PROPOFOL;  Surgeon: Sherrilyn Rist, MD;  Location: [facility];  Service: Gastroenterology;  Laterality: N/A;  with stent removal.   GASTROINTESTINAL STENT REMOVAL N/A 03/06/2016   Procedure: GASTROINTESTINAL STENT REMOVAL;  Surgeon: Sherrilyn Rist, MD;  Location: [facility];  Service: Gastroenterology;  Laterality: N/A;   POLYPECTOMY  09/30/2021   Procedure: POLYPECTOMY;  Surgeon: Sherrilyn Rist, MD;  Location: [facility];  Service: Gastroenterology;;   TONSILLECTOMY     ULNAR NERVE TRANSPOSITION     VASECTOMY      Current Outpatient Medications  Medication Sig Dispense Refill   diltiazem (CARDIZEM) 30 MG tablet Take 1 tablet (30 mg total) by mouth 2 (two) times daily as needed for up to 20 doses (for HR > 110). 20 tablet 0   No current facility-administered medications for this encounter.    No Known Allergies  Social History   Socioeconomic History   Marital status: Married    Spouse name: Noreene Larsson   Number of children: 2   Years of education: 16   Highest education level: Bachelor's degree (e.g., BA, AB, BS)  Occupational History   Occupation: sales  Tobacco Use   Smoking status: Never   Smokeless tobacco: Never   Tobacco comments:    Never smoke 11/04/22  Vaping Use   Vaping Use: Never used  Substance and Sexual Activity   Alcohol use: Not Currently    Comment: social at times   Drug use: No   Sexual activity: Yes  Other Topics Concern   Not on file  Social History Narrative   Lives with wife in [place]    Social Determinants of Health   Financial Resource Strain: Not on file  Food Insecurity: Not on file  Transportation Needs: Not on file  Physical Activity: Not on file  Stress: Not on file  Social Connections: Not on file  Intimate Partner Violence: Not on file      ROS- All systems are reviewed and negative except as per the HPI above.  Physical Exam: Vitals:   11/04/22 1526  BP: 122/80  Pulse: 63  Weight: 88 kg  Height: 5\' 8"  (1.727 m)    GEN- The patient is well appearing, alert and oriented x 3 today.   Head- normocephalic, atraumatic Eyes-  Sclera clear, conjunctiva pink Ears- hearing intact Oropharynx- clear Neck- supple, no JVP Lymph- no cervical lymphadenopathy Lungs- Clear to ausculation bilaterally, normal work of breathing Heart- Regular rate and rhythm, no murmurs, rubs or gallops, PMI not laterally displaced GI- soft, NT, ND, + BS Extremities- no clubbing, cyanosis, or edema MS- no significant deformity or atrophy Skin- no rash or lesion Psych- euthymic mood, full affect Neuro- strength and sensation are intact  Wt Readings from Last 3 Encounters:  11/04/22 88 kg  10/30/22 84.8 kg  03/25/22 81.7 kg  EKG today demonstrates  Vent. rate 63 BPM PR interval 162 ms QRS duration 106 ms QT/QTcB 404/413 ms P-R-T axes 50 44 14 Normal sinus rhythm Possible Inferior infarct , age undetermined Abnormal ECG When compared with ECG of 30-Oct-2022 21:37, PREVIOUS ECG IS PRESENT  Echo 03/12/22 demonstrated: 1. Left ventricular ejection fraction, by estimation, is 60 to 65%. Left  ventricular ejection fraction by 3D volume is 60 %. The left ventricle has  normal function. The left ventricle has no regional wall motion  abnormalities. Left ventricular diastolic   parameters were normal.   2. Right ventricular systolic function is normal. The right ventricular  size is normal. Tricuspid regurgitation signal is inadequate for assessing  PA pressure.   3. The mitral valve is normal in structure. No evidence of mitral valve  regurgitation.   4. The aortic valve is tricuspid. Aortic valve regurgitation is not  visualized.   5. The inferior vena cava is normal in size with greater than 50%  respiratory variability,  suggesting right atrial pressure of 3 mmHg.  Epic records are reviewed at length today.  Cardiac monitor 8/23-30/2023: Impression: Brief supraventricular tachycardia detected (7 episodes; longest duration 5.9 seconds).  Rare ectopy.   CHA2DS2-VASc Score = 0  The patient's score is based upon: CHF History: 0 HTN History: 0 Diabetes History: 0 Stroke History: 0 Vascular Disease History: 0 Age Score: 0 Gender Score: 0       ASSESSMENT AND PLAN: Paroxysmal Atrial Fibrillation (ICD10:  I48.0) The patient's CHA2DS2-VASc score is 0, indicating a 0.2% annual risk of stroke.    Education provided about Afib with visual diagram. Discussion about medication treatments and ablation in detail. After discussion, we will proceed with conservative observation at this time.  He is in SR today. He has not been wearing his Apple Watch; only sporadically. After discussion, he will wear his watch consistently and f/u 6 weeks to reassess Afib burden.  We discussed his risk score being zero for stroke secondary to atrial fibrillation. After discussion, he will stop Eliquis today.  3. Obesity Body mass index is 29.5 kg/m. Lifestyle modification was discussed at length including regular exercise and weight reduction. Encouraged activity as tolerated.  4. Obstructive sleep apnea The importance of adequate treatment of sleep apnea was discussed today in order to improve our ability to maintain sinus rhythm long term.  He is compliant with CPAP therapy.  5. Chest pain on exertion Review of records showing plan by Dr. Flora Lipps to perform coronary CTA if monitor or echo were abnormal in 02/2022. Due to new episode of exertional chest pain, will have patient see cardiology again to determine if further workup is needed such as imaging or stress test.    Follow up 6 weeks Afib clinic to reassess burden via Apple Watch.   Lake Bells, PA-C Afib Clinic [facility] [address] [place], [place] [address] [phone] 11/04/2022 4:21 PM

## 2022-11-05 DIAGNOSIS — I4891 Unspecified atrial fibrillation: Secondary | ICD-10-CM | POA: Insufficient documentation

## 2022-11-14 ENCOUNTER — Telehealth: Payer: Self-pay

## 2022-11-14 NOTE — Telephone Encounter (Signed)
-----   Message from Sande Rives, MD sent at 11/12/2022  7:32 PM EDT ----- Can we get him in next week?  Gerri Spore T. Flora Lipps, MD, [facility]  [facility]  [address] [place], [place] [address] [phone]  7:32 PM

## 2022-11-14 NOTE — Telephone Encounter (Signed)
Called patient- per request of MD to have patient see [place] next week.   Left message to call back.  Left call back number.

## 2022-11-14 NOTE — Telephone Encounter (Signed)
Attempted to call back, LVM to call back- left call back number.

## 2022-11-14 NOTE — Telephone Encounter (Signed)
Patient was returning call. Please advise ?

## 2022-11-14 NOTE — Telephone Encounter (Signed)
Patient returned call, reschedule appointment to 05/17 with Dr.O'Neal.  Patient verbalized understanding.

## 2022-11-20 NOTE — Progress Notes (Signed)
Cardiology Office Note:   Date:  11/21/2022  NAME:  Raymond Kelly    MRN: [id number] DOB:  1970-10-06   PCP:  Roger Kill, PA-C  Cardiologist:  None  Electrophysiologist:  None   Referring MD: Roger Kill, *   Chief Complaint  Patient presents with   Follow-up        History of Present Illness:   Raymond Kelly is a 52 y.o. male with a hx of pAF who presents for follow-up. Seen in ER 4/25 for new onset Afib. Converted back to NSR.  He reports for the past 2 to 3 months he has had episodes of exertional chest pressure.  Symptoms resolve with cessation of activity.  They have also coincided with new onset A-fib.  Was seen in the emergency room on 10/30/2022.  Felt chest discomfort.  Was in A-fib.  EKG at A-fib clinic showed normal sinus rhythm with no acute ischemic changes.  He reports still getting exertional chest symptoms.  Only occur with exertion.  He is quite concerned for possible CAD.  He has never had a heart attack or stroke.  Diagnosed with alopecia.  No further A-fib per his report.  Blood pressure 118/72.  CV examination normal.  We discussed coronary CTA for further evaluation to define his anatomy.  He is also dealing with brain fog and insomnia.  His primary care physician believes he is depressed.  Unclear what his A-fib burden is.  No need for anticoagulation.  Problem List Paroxysmal Afib -CHADSVASC=0 2. OSA 3. Alopecia   Past Medical History: Past Medical History:  Diagnosis Date   Alopecia areata    Diverticulitis    Dizziness    Family history of adverse reaction to anesthesia    mother slow to awaken   History of kidney stones 2007    Past Surgical History: Past Surgical History:  Procedure Laterality Date   BIOPSY  09/30/2021   Procedure: BIOPSY;  Surgeon: Sherrilyn Rist, MD;  Location: [facility];  Service: Gastroenterology;;   CHOLECYSTECTOMY N/A 02/01/2016   Procedure: LAPAROSCOPIC CHOLECYSTECTOMY WITH INTRAOPERATIVE  CHOLANGIOGRAM;  Surgeon: Abigail Miyamoto, MD;  Location: [facility];  Service: General;  Laterality: N/A;   COLONOSCOPY     COLONOSCOPY WITH PROPOFOL N/A 09/30/2021   Procedure: COLONOSCOPY WITH PROPOFOL;  Surgeon: Sherrilyn Rist, MD;  Location: [facility];  Service: Gastroenterology;  Laterality: N/A;   cyst removed     between his front teeth and cyst removed from abdomal area   ESOPHAGOGASTRODUODENOSCOPY (EGD) WITH PROPOFOL N/A 03/06/2016   Procedure: ESOPHAGOGASTRODUODENOSCOPY (EGD) WITH PROPOFOL;  Surgeon: Sherrilyn Rist, MD;  Location: [facility];  Service: Gastroenterology;  Laterality: N/A;  with stent removal.   GASTROINTESTINAL STENT REMOVAL N/A 03/06/2016   Procedure: GASTROINTESTINAL STENT REMOVAL;  Surgeon: Sherrilyn Rist, MD;  Location: [facility];  Service: Gastroenterology;  Laterality: N/A;   POLYPECTOMY  09/30/2021   Procedure: POLYPECTOMY;  Surgeon: Sherrilyn Rist, MD;  Location: [facility];  Service: Gastroenterology;;   TONSILLECTOMY     ULNAR NERVE TRANSPOSITION     VASECTOMY      Current Medications: Current Meds  Medication Sig   aspirin EC 81 MG tablet Take 1 tablet (81 mg total) by mouth daily. Swallow whole.   nitroGLYCERIN (NITROSTAT) 0.4 MG SL tablet Place 1 tablet (0.4 mg total) under the tongue every 5 (five) minutes as needed for chest pain.     Allergies:  Patient has no known allergies.   Social History: Social History   Socioeconomic History   Marital status: Married    Spouse name: Noreene Larsson   Number of children: 2   Years of education: 16   Highest education level: Bachelor's degree (e.g., BA, AB, BS)  Occupational History   Occupation: sales  Tobacco Use   Smoking status: Never   Smokeless tobacco: Never   Tobacco comments:    Never smoke 11/04/22  Vaping Use   Vaping Use: Never used  Substance and Sexual Activity   Alcohol use: Not Currently    Comment: social at times   Drug use: No   Sexual activity: Yes  Other  Topics Concern   Not on file  Social History Narrative   Lives with wife in [place]    Social Determinants of Health   Financial Resource Strain: Not on file  Food Insecurity: Not on file  Transportation Needs: Not on file  Physical Activity: Not on file  Stress: Not on file  Social Connections: Not on file     Family History: The patient's family history includes ALS in his father; Arthritis in his father; Diabetes in his brother; Heart attack in his mother; Hypertension in his father and mother; Irritable bowel syndrome in his mother; Juvenile Diabetes in his niece; Neuropathy in his father; Other in his father; Pancreatic cancer in his maternal grandfather; Stroke in his father; Ulcerative colitis in his mother. There is no history of Colon cancer, Rectal cancer, Stomach cancer, or Esophageal cancer.  ROS:   All other ROS reviewed and negative. Pertinent positives noted in the HPI.     EKGs/Labs/Other Studies Reviewed:   The following studies were personally reviewed by me today:  EKG:  EKG is ordered today.  The ekg ordered today demonstrates sinus bradycardia heart rate 52, no acute ischemic changes or evidence of infarction, and was personally reviewed by me.   TTE 03/12/2022  1. Left ventricular ejection fraction, by estimation, is 60 to 65%. Left  ventricular ejection fraction by 3D volume is 60 %. The left ventricle has  normal function. The left ventricle has no regional wall motion  abnormalities. Left ventricular diastolic   parameters were normal.   2. Right ventricular systolic function is normal. The right ventricular  size is normal. Tricuspid regurgitation signal is inadequate for assessing  PA pressure.   3. The mitral valve is normal in structure. No evidence of mitral valve  regurgitation.   4. The aortic valve is tricuspid. Aortic valve regurgitation is not  visualized.   5. The inferior vena cava is normal in size with greater than 50%  respiratory  variability, suggesting right atrial pressure of 3 mmHg.   Recent Labs: 10/30/2022: BUN 23; Creatinine, Ser 1.35; Hemoglobin 15.3; Magnesium 2.1; Platelets 221; Potassium 3.9; Sodium 143   Recent Lipid Panel No results found for: "CHOL", "TRIG", "HDL", "CHOLHDL", "VLDL", "LDLCALC", "LDLDIRECT"  Physical Exam:   VS:  BP 118/72 (BP Location: Left Arm, Patient Position: Sitting, Cuff Size: Normal)   Pulse 64   Ht 5\' 8"  (1.727 m)   Wt 188 lb 12.8 oz (85.6 kg)   SpO2 98%   BMI 28.71 kg/m    Wt Readings from Last 3 Encounters:  11/21/22 188 lb 12.8 oz (85.6 kg)  11/04/22 194 lb (88 kg)  10/30/22 187 lb (84.8 kg)    General: Well nourished, well developed, in no acute distress Head: Atraumatic, normal size  Eyes: PEERLA, EOMI  Neck: Supple, no JVD Endocrine: No thryomegaly Cardiac: Normal S1, S2; RRR; no murmurs, rubs, or gallops Lungs: Clear to auscultation bilaterally, no wheezing, rhonchi or rales  Abd: Soft, nontender, no hepatomegaly  Ext: No edema, pulses 2+ Musculoskeletal: No deformities, BUE and BLE strength normal and equal Skin: Warm and dry, no rashes   Neuro: Alert and oriented to person, place, time, and situation, CNII-XII grossly intact, no focal deficits  Psych: Normal mood and affect   ASSESSMENT:   KDYN LECKER is a 52 y.o. male who presents for the following: 1. Paroxysmal atrial fibrillation (HCC)   2. Precordial pain     PLAN:   1. Paroxysmal atrial fibrillation (HCC) -Diagnosed with paroxysmal A-fib.  CHA2DS2-VASc equals 0.  Currently with diltiazem as needed.  We will plan for an outpatient 7-day monitor after he completes evaluation for chest pain.  See discussion below.  No need for anticoagulation.  2. Precordial pain -Exertional chest discomfort.  Likely represents angina.  We will start aspirin 81 mg daily.  BP quite low.  Heart rate in the 60s.  No metoprolol.  We will start him on sublingual nitroglycerin as needed.  We will obtain his coronary CTA  early next week.  He will give [place] a BMP and fasting lipids today.  Recent TSH is normal.  Echocardiogram last year was normal as well.  He will see me back in 3 months to discuss further.  Sooner if needed.  Disposition: Return in about 3 months (around 02/21/2023).  Medication Adjustments/Labs and Tests Ordered: Current medicines are reviewed at length with the patient today.  Concerns regarding medicines are outlined above.  Orders Placed This Encounter  Procedures   CT CORONARY MORPH W/CTA COR W/SCORE W/CA W/CM &/OR WO/CM   Basic metabolic panel   Lipid panel   Meds ordered this encounter  Medications   aspirin EC 81 MG tablet    Sig: Take 1 tablet (81 mg total) by mouth daily. Swallow whole.    Dispense:  90 tablet    Refill:  3   nitroGLYCERIN (NITROSTAT) 0.4 MG SL tablet    Sig: Place 1 tablet (0.4 mg total) under the tongue every 5 (five) minutes as needed for chest pain.    Dispense:  25 tablet    Refill:  3    Patient Instructions  Medication Instructions:  Start Aspirin 81 mg daily  Take Nitroglycerin as prescribed.   NITROGLYCERIN  is a type of vasodilator. It relaxes blood vessels, increasing the blood and oxygen supply to your heart. This medicine is used to relieve chest pain caused by angina.  In an angina attack (CHEST PAIN), you should feel better within 5 minutes after your first dose. Do not swallow whole. Place tablet under your tongue. Sit down when taking this medicine. You can take a dose every 5 minutes up to a total of 3 doses. If you do not feel better or feel worse after 1 dose, call 9-1-1 at once. Do not take more than 3 doses in 15 minutes. Do not take your medicine more often than directed.   *If you need a refill on your cardiac medications before your next appointment, please call your pharmacy*   Lab Work: BMET, LIPID today   If you have labs (blood work) drawn today and your tests are completely normal, you will receive your results only  by: MyChart Message (if you have MyChart) OR A paper copy in the mail If you have any lab  test that is abnormal or we need to change your treatment, we will call you to review the results.   Testing/Procedures: Coronary CTA- they will contact you to get this scheduled.    Follow-Up: At [facility], you and your health needs are our priority.  As part of our continuing mission to provide you with exceptional heart care, we have created designated Provider Care Teams.  These Care Teams include your primary Cardiologist (physician) and Advanced Practice Providers (APPs -  Physician Assistants and Nurse Practitioners) who all work together to provide you with the care you need, when you need it.  We recommend signing up for the patient portal called "MyChart".  Sign up information is provided on this After Visit Summary.  MyChart is used to connect with patients for Virtual Visits (Telemedicine).  Patients are able to view lab/test results, encounter notes, upcoming appointments, etc.  Non-urgent messages can be sent to your provider as well.   To learn more about what you can do with MyChart, go to [email or web address].    Your next appointment:   3 month(s)  Provider:   Lennie Odor, MD   Other Instructions   Your cardiac CT will be scheduled at one of the below locations:   [facility] [address] [place], [place] [address] [phone]   If scheduled at [facility], please arrive at the [facility] and Children's Entrance (Entrance C2) of [facility] 30 minutes prior to test start time. You can use the FREE valet parking offered at entrance C (encouraged to control the heart rate for the test)  Proceed to the [facility] Radiology Department (first floor) to check-in and test prep.  All radiology patients and guests should use entrance C2 at [facility], accessed from [facility], even though the hospital's physical  address listed is [address]      Please follow these instructions carefully (unless otherwise directed):  Hold all erectile dysfunction medications at least 3 days (72 hrs) prior to test. (Ie viagra, cialis, sildenafil, tadalafil, etc) We will administer nitroglycerin during this exam.   On the Night Before the Test: Be sure to Drink plenty of water. Do not consume any caffeinated/decaffeinated beverages or chocolate 12 hours prior to your test. Do not take any antihistamines 12 hours prior to your test.  On the Day of the Test: Drink plenty of water until 1 hour prior to the test. Do not eat any food 1 hour prior to test. You may take your regular medications prior to the test.  Take metoprolol (Lopressor) two hours prior to test. If you take Furosemide/Hydrochlorothiazide/Spironolactone, please HOLD on the morning of the test.      After the Test: Drink plenty of water. After receiving IV contrast, you may experience a mild flushed feeling. This is normal. On occasion, you may experience a mild rash up to 24 hours after the test. This is not dangerous. If this occurs, you can take Benadryl 25 mg and increase your fluid intake. If you experience trouble breathing, this can be serious. If it is severe call 911 IMMEDIATELY. If it is mild, please call our office. If you take any of these medications: Glipizide/Metformin, Avandament, Glucavance, please do not take 48 hours after completing test unless otherwise instructed.  We will call to schedule your test 2-4 weeks out understanding that some insurance companies will need an authorization prior to the service being performed.   For non-scheduling related  questions, please contact the cardiac imaging nurse navigator should you have any questions/concerns: Rockwell Alexandria, Cardiac Imaging Nurse Navigator Larey Brick, Cardiac Imaging Nurse Navigator  Heart and Vascular Services Direct Office Dial:  [phone]   For scheduling needs, including cancellations and rescheduling, please call [place], [phone].     Time Spent with Patient: I have spent a total of 35 minutes with patient reviewing hospital notes, telemetry, EKGs, labs and examining the patient as well as establishing an assessment and plan that was discussed with the patient.  > 50% of time was spent in direct patient care.  Signed, Lenna Gilford. Flora Lipps, MD, [facility]  [facility]  [address] [place], [place] [address] [phone]  11/21/2022 8:39 AM

## 2022-11-21 ENCOUNTER — Telehealth (HOSPITAL_COMMUNITY): Payer: Self-pay | Admitting: Emergency Medicine

## 2022-11-21 ENCOUNTER — Encounter: Payer: Self-pay | Admitting: Cardiovascular Disease

## 2022-11-21 ENCOUNTER — Ambulatory Visit: Payer: 59 | Attending: Cardiovascular Disease | Admitting: Cardiovascular Disease

## 2022-11-21 VITALS — BP 118/72 | HR 64 | Ht 68.0 in | Wt 188.8 lb

## 2022-11-21 DIAGNOSIS — I480 Paroxysmal atrial fibrillation: Secondary | ICD-10-CM | POA: Diagnosis not present

## 2022-11-21 DIAGNOSIS — R072 Precordial pain: Secondary | ICD-10-CM | POA: Diagnosis not present

## 2022-11-21 MED ORDER — NITROGLYCERIN 0.4 MG SL SUBL: 0.4000 mg | SUBLINGUAL_TABLET | SUBLINGUAL | 3 refills | Status: DC | PRN

## 2022-11-21 MED ORDER — ASPIRIN 81 MG PO TBEC: 81.0000 mg | DELAYED_RELEASE_TABLET | Freq: Every day | ORAL | 3 refills | Status: DC

## 2022-11-21 NOTE — Patient Instructions (Addendum)
Medication Instructions:  Start Aspirin 81 mg daily  Take Nitroglycerin as prescribed.   NITROGLYCERIN  is a type of vasodilator. It relaxes blood vessels, increasing the blood and oxygen supply to your heart. This medicine is used to relieve chest pain caused by angina.  In an angina attack (CHEST PAIN), you should feel better within 5 minutes after your first dose. Do not swallow whole. Place tablet under your tongue. Sit down when taking this medicine. You can take a dose every 5 minutes up to a total of 3 doses. If you do not feel better or feel worse after 1 dose, call 9-1-1 at once. Do not take more than 3 doses in 15 minutes. Do not take your medicine more often than directed.   *If you need a refill on your cardiac medications before your next appointment, please call your pharmacy*   Lab Work: BMET, LIPID today   If you have labs (blood work) drawn today and your tests are completely normal, you will receive your results only by: MyChart Message (if you have MyChart) OR A paper copy in the mail If you have any lab test that is abnormal or we need to change your treatment, we will call you to review the results.   Testing/Procedures: Coronary CTA- they will contact you to get this scheduled.    Follow-Up: At [facility], you and your health needs are our priority.  As part of our continuing mission to provide you with exceptional heart care, we have created designated Provider Care Teams.  These Care Teams include your primary Cardiologist (physician) and Advanced Practice Providers (APPs -  Physician Assistants and Nurse Practitioners) who all work together to provide you with the care you need, when you need it.  We recommend signing up for the patient portal called "MyChart".  Sign up information is provided on this After Visit Summary.  MyChart is used to connect with patients for Virtual Visits (Telemedicine).  Patients are able to view lab/test results, encounter  notes, upcoming appointments, etc.  Non-urgent messages can be sent to your provider as well.   To learn more about what you can do with MyChart, go to [email or web address].    Your next appointment:   3 month(s)  Provider:   Lennie Odor, MD   Other Instructions   Your cardiac CT will be scheduled at one of the below locations:   [facility] [address] [place], [place] [address] [phone]   If scheduled at [facility], please arrive at the [facility] and Children's Entrance (Entrance C2) of [facility] 30 minutes prior to test start time. You can use the FREE valet parking offered at entrance C (encouraged to control the heart rate for the test)  Proceed to the [facility] Radiology Department (first floor) to check-in and test prep.  All radiology patients and guests should use entrance C2 at [facility], accessed from [facility], even though the hospital's physical address listed is [address]      Please follow these instructions carefully (unless otherwise directed):  Hold all erectile dysfunction medications at least 3 days (72 hrs) prior to test. (Ie viagra, cialis, sildenafil, tadalafil, etc) We will administer nitroglycerin during this exam.   On the Night Before the Test: Be sure to Drink plenty of water. Do not consume any caffeinated/decaffeinated beverages or chocolate 12 hours prior to your test. Do not take any antihistamines 12 hours prior to your  test.  On the Day of the Test: Drink plenty of water until 1 hour prior to the test. Do not eat any food 1 hour prior to test. You may take your regular medications prior to the test.  Take metoprolol (Lopressor) two hours prior to test. If you take Furosemide/Hydrochlorothiazide/Spironolactone, please HOLD on the morning of the test.      After the Test: Drink plenty of water. After receiving IV contrast, you may experience a mild flushed  feeling. This is normal. On occasion, you may experience a mild rash up to 24 hours after the test. This is not dangerous. If this occurs, you can take Benadryl 25 mg and increase your fluid intake. If you experience trouble breathing, this can be serious. If it is severe call 911 IMMEDIATELY. If it is mild, please call our office. If you take any of these medications: Glipizide/Metformin, Avandament, Glucavance, please do not take 48 hours after completing test unless otherwise instructed.  We will call to schedule your test 2-4 weeks out understanding that some insurance companies will need an authorization prior to the service being performed.   For non-scheduling related questions, please contact the cardiac imaging nurse navigator should you have any questions/concerns: Rockwell Alexandria, Cardiac Imaging Nurse Navigator Larey Brick, Cardiac Imaging Nurse Navigator [place] [facility] Direct Office Dial: [phone]   For scheduling needs, including cancellations and rescheduling, please call [place], [phone].

## 2022-11-21 NOTE — Telephone Encounter (Signed)
Reaching out to patient to offer assistance regarding upcoming cardiac imaging study; pt verbalizes understanding of appt date/time, parking situation and where to check in, pre-test NPO status and medications ordered, and verified current allergies; name and call back number provided for further questions should they arise Kirtis Challis RN Navigator Cardiac Imaging [place] [facility] [phone] office [phone] cell 

## 2022-11-22 LAB — BASIC METABOLIC PANEL
BUN/Creatinine Ratio: 12 (ref 9–20)
BUN: 15 mg/dL (ref 6–24)
CO2: 24 mmol/L (ref 20–29)
Calcium: 9.2 mg/dL (ref 8.7–10.2)
Chloride: 104 mmol/L (ref 96–106)
Creatinine, Ser: 1.24 mg/dL (ref 0.76–1.27)
Glucose: 88 mg/dL (ref 70–99)
Potassium: 4.1 mmol/L (ref 3.5–5.2)
Sodium: 142 mmol/L (ref 134–144)
eGFR: 69 mL/min/{1.73_m2} (ref >59)

## 2022-11-22 LAB — LIPID PANEL
Chol/HDL Ratio: 4.5 ratio (ref 0.0–5.0)
Cholesterol, Total: 235 mg/dL — ABNORMAL HIGH (ref 100–199)
HDL: 52 mg/dL (ref >39)
LDL Chol Calc (NIH): 170 mg/dL — ABNORMAL HIGH (ref 0–99)
Triglycerides: 76 mg/dL (ref 0–149)
VLDL Cholesterol Cal: 13 mg/dL (ref 5–40)

## 2022-11-24 ENCOUNTER — Ambulatory Visit (HOSPITAL_COMMUNITY)
Admission: RE | Admit: 2022-11-24 | Discharge: 2022-11-24 | Disposition: A | Discharge status: Home / Self Care | Payer: 59 | Source: Ambulatory Visit | Attending: Cardiovascular Disease | Admitting: Cardiovascular Disease

## 2022-11-24 DIAGNOSIS — R072 Precordial pain: Secondary | ICD-10-CM | POA: Insufficient documentation

## 2022-11-24 DIAGNOSIS — I480 Paroxysmal atrial fibrillation: Secondary | ICD-10-CM | POA: Insufficient documentation

## 2022-11-24 MED ORDER — NITROGLYCERIN 0.4 MG SL SUBL
0.8000 mg | SUBLINGUAL_TABLET | Freq: Once | SUBLINGUAL | Status: AC
  Administered 2022-11-24: 0.8 mg via SUBLINGUAL

## 2022-11-24 MED ORDER — NITROGLYCERIN 0.4 MG SL SUBL
SUBLINGUAL_TABLET | SUBLINGUAL | Status: AC
  Filled 2022-11-24: qty 2

## 2022-11-24 MED ORDER — IOHEXOL 350 MG/ML SOLN
95.0000 mL | Freq: Once | INTRAVENOUS | Status: AC | PRN
  Administered 2022-11-24: 95 mL via INTRAVENOUS

## 2022-11-25 NOTE — Addendum Note (Signed)
Addended by: Jethro Bolus A on: 11/25/2022 07:06 AM   Modules accepted: Orders

## 2022-11-26 ENCOUNTER — Ambulatory Visit: Payer: 59 | Attending: Cardiovascular Disease

## 2022-11-26 ENCOUNTER — Other Ambulatory Visit: Payer: Self-pay

## 2022-11-26 DIAGNOSIS — R002 Palpitations: Secondary | ICD-10-CM

## 2022-11-26 MED ORDER — ROSUVASTATIN CALCIUM 20 MG PO TABS: 20.0000 mg | ORAL_TABLET | Freq: Every day | ORAL | 3 refills | Status: DC

## 2022-11-26 NOTE — Progress Notes (Unsigned)
Enrolled patient for a 7 day Zio XT monitor to be mailed to patients home.  

## 2022-12-03 ENCOUNTER — Ambulatory Visit: Payer: 59 | Admitting: Cardiovascular Disease

## 2022-12-16 ENCOUNTER — Ambulatory Visit (HOSPITAL_COMMUNITY): Payer: 59 | Admitting: Internal Medicine

## 2023-01-27 ENCOUNTER — Encounter: Payer: Self-pay | Admitting: Family Medicine

## 2023-01-27 DIAGNOSIS — R1012 Left upper quadrant pain: Secondary | ICD-10-CM

## 2023-01-28 ENCOUNTER — Other Ambulatory Visit: Payer: Self-pay | Admitting: Family Medicine

## 2023-01-28 DIAGNOSIS — R1902 Left upper quadrant abdominal swelling, mass and lump: Secondary | ICD-10-CM

## 2023-01-28 DIAGNOSIS — R1012 Left upper quadrant pain: Secondary | ICD-10-CM

## 2023-02-03 ENCOUNTER — Ambulatory Visit: Admission: RE | Admit: 2023-02-03 | Payer: BC Managed Care – PPO | Source: Ambulatory Visit

## 2023-02-03 ENCOUNTER — Ambulatory Visit
Admission: RE | Admit: 2023-02-03 | Discharge: 2023-02-03 | Disposition: A | Discharge status: Home / Self Care | Payer: 59 | Source: Ambulatory Visit | Attending: Family Medicine | Admitting: Family Medicine

## 2023-02-03 DIAGNOSIS — R1012 Left upper quadrant pain: Secondary | ICD-10-CM

## 2023-02-03 DIAGNOSIS — R1902 Left upper quadrant abdominal swelling, mass and lump: Secondary | ICD-10-CM

## 2023-02-23 ENCOUNTER — Ambulatory Visit: Payer: BC Managed Care – PPO | Admitting: Cardiovascular Disease

## 2023-03-24 NOTE — Progress Notes (Unsigned)
03/25/2023 Raymond Kelly [id number] 1970/09/24  Referring provider: Roger Kill, * Primary GI doctor: Dr. Myrtie Neither  ASSESSMENT AND PLAN:   52 year old male with longstanding history of abdominal discomfort with flatulence, has some lower abdominal discomfort as well as complaints the most out of the left upper abdominal discomfort CT unremarkable 2023 other mild sigmoid thickening Colonoscopy 2023 without evidence of scad or colitis Recent abdominal ultrasound negative Patient states symptoms are worse with sitting down, at the end of the day, better with lying down flat, possible pelvic floor component given information the patient. I believe this most likely fits with IBS specifically splenic flexure syndrome is a possibility, started on IBgard, FODMAP diet given. Wife is a Engineer, civil (consulting), and has been stating he has been having some clearing of his throat, some reflux, and she is concerned the left upper quadrant is stomach related.  Will plan on repeat EGD. Discussed possibility of Xifaxan trial if EGD negative. Also discussed with patient potentially switching to Cymbalta or other SSRI for anxiety.  neurological issues Patient had a lot of questions about potential gut brain interaction asking if his intestinal issues were causing his inattention and fatigue that he was having after eating. Patient's had negative MRI brain Recent workup with normal complements, normal thyroid, normal B12 Suggested patient could potentially check insulin with primary care, do smaller more frequent meals, and continue working on his anxiety  Patient Care Team: Bernita Buffy as PCP - General (Physician Assistant)  HISTORY OF PRESENT ILLNESS: 51 y.o. male with a past medical history of Afib, OSA, diverticulitis, s/p cholecystectomy with bile leak and others listed below presents for evaluation of gas.   09/2021 colonoscopy revealed 2 diminutive adenomatous left colon polyps as well as  mild nonspecific patchy sigmoid erythema, biopsies of that area normal.  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  08/07/2021 CT AB and pelvis  Changes of prior diverticulosis with wall thickening in the sigmoid colon. No acute diverticulitis is seen. 02/03/2023 AB US unremarkable  He states after he eats he feels cognitively slow, gets spaced out, feels this is getting worse. MRI brain 10/17/2021 MRI brain unremarkable.  He has LUQ gas sensation, bloating, constant sensation.  Can be better on weekends, lying down flat, or standing straight.  Worse with sitting.   Will find himself at work flexing/squeezing/tensing his AB for the last 6 months, states this may be causing some muscle soreness.  He can have it at work and while driving. He has intermittent gas, non odorous, worse after bigger meals.  Denies nausea, vomiting. He has had some weight gain.  He avoid milk in last year, does still eat cheese, did not help.  He has once a week urgent diarrhea, occ twice, more common in the morning. Bm's daily formed, can be up to 2-3 x a day.  Feel incomplete BM.  H pylori negative He has been on wellbutrin for 3 months, helps with focus a little at work.   He denies blood thinner use.  He denies NSAID use.  He denies ETOH use.   He denies tobacco use.  He denies drug use.    He  reports that he has never smoked. He has never used smokeless tobacco. He reports that he does not currently use alcohol. He reports that he does not use drugs.  RELEVANT LABS AND IMAGING: CBC  Component Value Date/Time   WBC 6.0 10/30/2022 2150   RBC 4.79 10/30/2022 2150   HGB 15.3 10/30/2022 2150   HCT 44.8 10/30/2022 2150   PLT 221 10/30/2022 2150   MCV 93.5 10/30/2022 2150   MCH 31.9 10/30/2022 2150   MCHC 34.2 10/30/2022 2150   RDW 12.2 10/30/2022 2150   LYMPHSABS 1.1 08/06/2021 1727    MONOABS 0.8 08/06/2021 1727   EOSABS 0.1 08/06/2021 1727   BASOSABS 0.0 08/06/2021 1727   Recent Labs    10/30/22 2150  HGB 15.3    CMP     Component Value Date/Time   NA 142 11/21/2022 0832   K 4.1 11/21/2022 0832   CL 104 11/21/2022 0832   CO2 24 11/21/2022 0832   GLUCOSE 88 11/21/2022 0832   GLUCOSE 101 (H) 10/30/2022 2150   BUN 15 11/21/2022 0832   CREATININE 1.24 11/21/2022 0832   CALCIUM 9.2 11/21/2022 0832   PROT 6.8 08/06/2021 1727   ALBUMIN 4.1 08/06/2021 1727   AST 16 08/06/2021 1727   ALT 16 08/06/2021 1727   ALKPHOS 82 08/06/2021 1727   BILITOT 0.9 08/06/2021 1727   GFRNONAA >60 10/30/2022 2150   GFRAA >60 09/13/2019 0941      Latest Ref Rng & Units 08/06/2021    5:27 PM 06/28/2021    4:07 PM 05/14/2021    7:39 AM  Hepatic Function  Total Protein 6.5 - 8.1 g/dL 6.8  6.9  6.8   Albumin 3.5 - 5.0 g/dL 4.1  4.3  4.3   AST 15 - 41 U/L 16  58  26   ALT 0 - 44 U/L 16  87  43   Alk Phosphatase 38 - 126 U/L 82  69  81   Total Bilirubin 0.3 - 1.2 mg/dL 0.9  1.4  0.9   Bilirubin, Direct 0.0 - 0.3 mg/dL   0.2       Current Medications:    Current Outpatient Medications (Cardiovascular):    rosuvastatin (CRESTOR) 20 MG tablet, Take 1 tablet (20 mg total) by mouth daily.     Current Outpatient Medications (Other):    buPROPion (WELLBUTRIN XL) 150 MG 24 hr tablet, Take 150 mg by mouth every morning.   Medical History:  Past Medical History:  Diagnosis Date   Alopecia areata    Diverticulitis    Dizziness    Family history of adverse reaction to anesthesia    mother slow to awaken   History of kidney stones 2007   Allergies: No Known Allergies   Surgical History:  He  has a past surgical history that includes Ulnar nerve transposition; cyst removed; Tonsillectomy; Cholecystectomy (N/A, 02/01/2016); Esophagogastroduodenoscopy (egd) with propofol (N/A, 03/06/2016); Gastrointestinal stent removal (N/A, 03/06/2016); Colonoscopy; Colonoscopy with  propofol (N/A, 09/30/2021); polypectomy (09/30/2021); biopsy (09/30/2021); and Vasectomy. Family History:  His family history includes ALS in his father; Arthritis in his father; Diabetes in his brother; Heart attack in his mother; Hypertension in his father and mother; Irritable bowel syndrome in his mother; Juvenile Diabetes in his niece; Neuropathy in his father; Other in his father; Pancreatic cancer in his maternal grandfather; Stroke in his father; Ulcerative colitis in his mother.  REVIEW OF SYSTEMS  : All other systems reviewed and negative except where noted in the History of Present Illness.  PHYSICAL EXAM: BP 110/68   Pulse 62   Ht 5\' 8"  (1.727 m)   Wt 196 lb (88.9 kg)   BMI 29.80 kg/m  General Appearance: Well nourished,  in no apparent distress. Head:   Normocephalic and atraumatic. Eyes:  sclerae anicteric,conjunctive pink  Respiratory: Respiratory effort normal, BS equal bilaterally without rales, rhonchi, wheezing. Cardio: RRR with no MRGs. Peripheral pulses intact.  Abdomen: Soft,  Non-distended ,active bowel sounds.  This tenderness in the epigastrium and in the LUQ. Without guarding and Without rebound. No masses. Rectal: Not evaluated Musculoskeletal: Full ROM, Normal gait. Without edema. Skin:  Dry and intact without significant lesions or rashes Neuro: Alert and  oriented x4;  No focal deficits. Psych:  Cooperative. Normal mood and affect.  Some tangential thinking   Doree Albee, PA-C 1:14 PM

## 2023-03-25 ENCOUNTER — Encounter: Payer: Self-pay | Admitting: Physician Assistant

## 2023-03-25 ENCOUNTER — Ambulatory Visit: Payer: BC Managed Care – PPO | Admitting: Physician Assistant

## 2023-03-25 VITALS — BP 110/68 | HR 62 | Ht 68.0 in | Wt 196.0 lb

## 2023-03-25 DIAGNOSIS — R1013 Epigastric pain: Secondary | ICD-10-CM | POA: Diagnosis not present

## 2023-03-25 DIAGNOSIS — R1012 Left upper quadrant pain: Secondary | ICD-10-CM | POA: Diagnosis not present

## 2023-03-25 DIAGNOSIS — Z8719 Personal history of other diseases of the digestive system: Secondary | ICD-10-CM | POA: Diagnosis not present

## 2023-03-25 DIAGNOSIS — R143 Flatulence: Secondary | ICD-10-CM

## 2023-03-25 NOTE — Progress Notes (Signed)
____________________________________________________________  Attending physician addendum:  Thank you for sending this case to me. I have reviewed the entire note and agree with the plan.  I recall [name] from prior visits and also reviewed my notes. EGD is reasonable given reported symptoms, though I expect it will most likely be normal. If so, a trial of rifaximin is also reasonable as empiric therapy for possible SIBO.   I cannot explain his post-prandial cognitive symptoms as part of a primary digestive disorder.  Amada Jupiter, MD  ____________________________________________________________

## 2023-03-25 NOTE — Patient Instructions (Addendum)
You have been scheduled for an endoscopy. Please follow written instructions given to you at your visit today.  If you use inhalers (even only as needed), please bring them with you on the day of your procedure.  If you take any of the following medications, they will need to be adjusted prior to your procedure:   DO NOT TAKE 7 DAYS PRIOR TO TEST- Trulicity (dulaglutide) Ozempic, Wegovy (semaglutide) Mounjaro (tirzepatide) Bydureon Bcise (exanatide extended release)  DO NOT TAKE 1 DAY PRIOR TO YOUR TEST Rybelsus (semaglutide) Adlyxin (lixisenatide) Victoza (liraglutide) Byetta (exanatide) ___________________________________________________________________________     Take the pepcid every morning and see how you do.  Avoid spicy and acidic foods Avoid fatty foods Limit your intake of coffee, tea, alcohol, and carbonated drinks Work to maintain a healthy weight Keep the head of the bed elevated at least 3 inches with blocks or a wedge pillow if you are having any nighttime symptoms Stay upright for 2 hours after eating Avoid meals and snacks three to four hours before bedtime  First do a trial off milk/lactose products if you use them.  Add fiber like benefiber or citracel once a day Increase activity  Can do trial of IBGard which is over the counter for AB pain- Take 1-2 capsules once a day for maintence or twice a day during a flare  Please try to decrease stress. consider talking with PCP about anti anxiety medication or try head space app for meditation. Can consider cymbalta if any worsening symptoms like blood in stool, weight loss, please call the office     FODMAP stands for fermentable oligo-, di-, mono-saccharides and polyols (1). These are the scientific terms used to classify groups of carbs that are difficult for our body to digest and that are notorious for triggering digestive symptoms like bloating, gas, loose stools and stomach pain.   You can try low  FODMAP diet  - start with eliminating just one column at a time that you feel may be a trigger for you. - the table at the very bottom contains foods that are low in FODMAPs   Sometimes trying to eliminate the FODMAP's from your diet is difficult or tricky, if you are stuggling with trying to do the elimination diet you can try an enzyme.  There is a food enzymes that you sprinkle in or on your food that helps break down the FODMAP. You can read more about the enzyme by going to this site: https://fodzyme.com/    _______________________________________________________  If your blood pressure at your visit was 140/90 or greater, please contact your primary care physician to follow up on this.  _______________________________________________________  If you are age 54 or older, your body mass index should be between 23-30. Your Body mass index is 29.8 kg/m. If this is out of the aforementioned range listed, please consider follow up with your Primary Care Provider.  If you are age 10 or younger, your body mass index should be between 19-25. Your Body mass index is 29.8 kg/m. If this is out of the aformentioned range listed, please consider follow up with your Primary Care Provider.   ________________________________________________________  The [place] providers would like to encourage you to use [facility] to communicate with providers for non-urgent requests or questions.  Due to long hold times on the telephone, sending your provider a message by [facility] may be a faster and more efficient way to get a response.  Please allow 48 business hours for a response.  Please  remember that this is for non-urgent requests.  _______________________________________________________ It was a pleasure to see you today!  Thank you for trusting me with your gastrointestinal care!

## 2023-03-27 ENCOUNTER — Encounter: Payer: Self-pay | Admitting: Gastroenterology

## 2023-04-09 ENCOUNTER — Encounter: Payer: Self-pay | Admitting: Gastroenterology

## 2023-04-09 ENCOUNTER — Ambulatory Visit: Payer: BC Managed Care – PPO | Admitting: Gastroenterology

## 2023-04-09 VITALS — BP 106/67 | HR 48 | Temp 98.6°F | Resp 12 | Ht 68.0 in | Wt 196.0 lb

## 2023-04-09 DIAGNOSIS — R1012 Left upper quadrant pain: Secondary | ICD-10-CM

## 2023-04-09 DIAGNOSIS — R070 Pain in throat: Secondary | ICD-10-CM

## 2023-04-09 DIAGNOSIS — K2289 Other specified disease of esophagus: Secondary | ICD-10-CM | POA: Diagnosis not present

## 2023-04-09 MED ORDER — SODIUM CHLORIDE 0.9 % IV SOLN: 500.0000 mL | INTRAVENOUS | Status: DC

## 2023-04-09 NOTE — Progress Notes (Signed)
Patient states there have been no changes to medical or surgical history since time of pre-visit. 

## 2023-04-09 NOTE — Patient Instructions (Signed)
Resume all of your previous medications today as ordered.  Read the instructions given to you by your recovery room nurse.  YOU HAD AN ENDOSCOPIC PROCEDURE TODAY AT THE [facility]:   Refer to the procedure report that was given to you for any specific questions about what was found during the examination.  If the procedure report does not answer your questions, please call your gastroenterologist to clarify.  If you requested that your care partner not be given the details of your procedure findings, then the procedure report has been included in a sealed envelope for you to review at your convenience later.  YOU SHOULD EXPECT: Some feelings of bloating in the abdomen. Passage of more gas than usual.   Please Note:  You might notice some irritation and congestion in your nose or some drainage.  This is from the oxygen used during your procedure.  There is no need for concern and it should clear up in a day or so.  SYMPTOMS TO REPORT IMMEDIATELY:   Following upper endoscopy (EGD)  Vomiting of blood or coffee ground material  New chest pain or pain under the shoulder blades  Painful or persistently difficult swallowing  New shortness of breath  Fever of 100F or higher  Black, tarry-looking stools  For urgent or emergent issues, a gastroenterologist can be reached at any hour by calling [phone]. Do not use MyChart messaging for urgent concerns.    DIET:  We do recommend a small meal at first, but then you may proceed to your regular diet.  Drink plenty of fluids but you should avoid alcoholic beverages for 24 hours.  ACTIVITY:  You should plan to take it easy for the rest of today and you should NOT DRIVE or use heavy machinery until tomorrow (because of the sedation medicines used during the test).    FOLLOW UP: Our staff will call the number listed on your records the next business day following your procedure.  We will call around 7:15- 8:00 am to check on you and  address any questions or concerns that you may have regarding the information given to you following your procedure. If we do not reach you, we will leave a message.     If any biopsies were taken you will be contacted by phone or by letter within the next 1-3 weeks.  Please call us at [phone] if you have not heard about the biopsies in 3 weeks.    SIGNATURES/CONFIDENTIALITY: You and/or your care partner have signed paperwork which will be entered into your electronic medical record.  These signatures attest to the fact that that the information above on your After Visit Summary has been reviewed and is understood.  Full responsibility of the confidentiality of this discharge information lies with you and/or your care-partner.

## 2023-04-09 NOTE — Progress Notes (Signed)
Sedate, gd SR, tolerated procedure well, VSS, report to RN 

## 2023-04-09 NOTE — Progress Notes (Signed)
No changes to clinical history since GI office visit on 03/25/23.  The patient is appropriate for an endoscopic procedure in the ambulatory setting.  - Amada Jupiter, MD

## 2023-04-09 NOTE — Op Note (Signed)
[facility] Patient Name: Raymond Kelly Procedure Date: 04/09/2023 4:27 PM MRN: [id number] Endoscopist: Sherilyn Cooter L. Myrtie Neither , MD, [id number] Age: 52 Referring MD:  Date of Birth: [birth year] Gender: Male Account #: [id number] Procedure:                Upper GI endoscopy Indications:              Abdominal pain in the left upper quadrant, Throat                            discomfort and post-prandial throat clearing ? GERD                           clinical details in recent office consult note Medicines:                Monitored Anesthesia Care Procedure:                Pre-Anesthesia Assessment:                           - Prior to the procedure, a History and Physical                            was performed, and patient medications and                            allergies were reviewed. The patient's tolerance of                            previous anesthesia was also reviewed. The risks                            and benefits of the procedure and the sedation                            options and risks were discussed with the patient.                            All questions were answered, and informed consent                            was obtained. Prior Anticoagulants: The patient has                            taken no anticoagulant or antiplatelet agents. ASA                            Grade Assessment: III - A patient with severe                            systemic disease. After reviewing the risks and                            benefits, the patient was deemed in satisfactory  condition to undergo the procedure.                           After obtaining informed consent, the endoscope was                            passed under direct vision. Throughout the                            procedure, the patient's blood pressure, pulse, and                            oxygen saturations were monitored continuously. The                             Olympus Scope [id number] was introduced through the                            mouth, and advanced to the second part of duodenum.                            The upper GI endoscopy was accomplished without                            difficulty. The patient tolerated the procedure                            well. Scope In: Scope Out: Findings:                 Mucosal changes characterized by scattered                            diminutive and slightly raised yellow plaques were                            found in the middle third of the esophagus and in                            the lower third of the esophagus. Biopsies were                            taken with a cold forceps for histology.                           The exam of the esophagus was otherwise normal.                           Normal mucosa was found in the entire examined                            stomach. Biopsies were taken with a cold forceps  for histology. (antrum and body in same pathology                            jar)                           The cardia and gastric fundus were normal on                            retroflexion.                           Normal mucosa was found in the entire duodenum.                            Biopsies for histology were taken with a cold                            forceps for evaluation of celiac disease. Complications:            No immediate complications. Estimated Blood Loss:     Estimated blood loss was minimal. Impression:               - Punctate white spotted mucosa in the esophagus.                            Biopsied.                           - Normal mucosa was found in the entire stomach.                            Biopsied.                           - Normal mucosa was found in the entire examined                            duodenum. Biopsied.                           No clear cause for LUQ pain seen.                           No visible  cause for patient's symptom of                            post-prandial fatigue.                           No endoscopic evidence of GERD seen on this exam,                            and throat clearing without pyrosis and                            regurgitation are not likely  to be reflux-related. Recommendation:           - Patient has a contact number available for                            emergencies. The signs and symptoms of potential                            delayed complications were discussed with the                            patient. Return to normal activities tomorrow.                            Written discharge instructions were provided to the                            patient.                           - Resume previous diet.                           - Continue present medications.                           - Await pathology results. Devlon Dosher L. Myrtie Neither, MD 04/09/2023 4:57:34 PM This report has been signed electronically.

## 2023-04-09 NOTE — Progress Notes (Signed)
Called to room to assist during endoscopic procedure.  Patient ID and intended procedure confirmed with present staff. Received instructions for my participation in the procedure from the performing physician.  

## 2023-04-10 ENCOUNTER — Telehealth: Payer: Self-pay

## 2023-04-10 NOTE — Telephone Encounter (Signed)
  Follow up Call-     04/09/2023    3:05 PM  Call back number  Post procedure Call Back phone  # [phone]  Permission to leave phone message Yes     Patient questions:  Do you have a fever, pain , or abdominal swelling? No. Pain Score  0 *  Have you tolerated food without any problems? Yes.    Have you been able to return to your normal activities? Yes.    Do you have any questions about your discharge instructions: Diet   No. Medications  No. Follow up visit  No.  Do you have questions or concerns about your Care? No.  Actions: * If pain score is 4 or above: No action needed, pain <4.

## 2023-04-15 ENCOUNTER — Telehealth: Payer: Self-pay | Admitting: Gastroenterology

## 2023-04-15 LAB — SURGICAL PATHOLOGY

## 2023-04-15 NOTE — Telephone Encounter (Signed)
Patient called stated he has 102.5 fever and bad diarrhea for the past two on every hour. Went to see primary care and they advised he call office to let Dr. Myrtie Neither know.

## 2023-04-16 ENCOUNTER — Encounter: Payer: Self-pay | Admitting: Gastroenterology

## 2023-04-16 NOTE — Telephone Encounter (Signed)
It sounds like he has an infection of some kind, most of which are viral.  I do not know why he was prescribed Azithromycin unless he had some stool tests with PCP with results that suggested the need for that.  In addition to the advice already given to him, it would be helpful to have the results of any stool studies he may have had done.  Let us know if he is not improved within a few days.  - H. Danis

## 2023-04-16 NOTE — Telephone Encounter (Signed)
[name], PA-C - 04/15/2023 11:40 AM EDT  Formatting of this note might be different from the original. Please try to stay well-hydrated with plenty of water and electrolyte beverages. Start azithromycin antibiotics today. Try to take with bland food if you can. Okay to use Imodium now that you are starting antibiotics. Please contact your GI office to let them know about your symptoms to ensure they do not recommend any further treatment or follow-up based on your recent procedure. Please let me know by the end of the week if you are not starting to improve. Electronically signed by [name], PA-C at 04/15/2023 12:16 PM EDT   Pt had EGD with bx on Monday 10/3, developed a fever Monday night-highest 103. Reports diarrhea started on Tuesday and it was the worst he has ever had it, states worse than colon prep. Fever is gone, stopped around Wed am. Reports the diarrhea started getting better this am. He is taking azithromycin and Imodium. He was able to eat some dry cereal this am. States he was having stomach cramps and these are gone today. Mentioned he had some dark stool yesterday but pt states he did take pepto, explained to him the bismuth makes the stool black in color. Please advise of any other recommendations.

## 2023-04-16 NOTE — Telephone Encounter (Signed)
Pt aware of recommendations per Dr. Myrtie Neither.

## 2023-04-16 NOTE — Telephone Encounter (Signed)
Since no stool studies done, I recommend stopping the azithromycin.  If his diarrhea is not improved by Mon, 10/14, then collect specimen for [facility] GI pathogen panel  - HD

## 2023-04-16 NOTE — Telephone Encounter (Signed)
Pt aware of recommendations and knows to call if not better in a few days. Pt states no labs/stool studies were done.

## 2023-07-14 NOTE — Progress Notes (Signed)
 I saw and evaluated the patient with the resident physician. I agree with the document findings and assessment.   Electronically signed by: Greig JINNY Blower, MD 07/14/2023 10:26 PM

## 2023-07-17 ENCOUNTER — Telehealth: Payer: Self-pay | Admitting: Cardiovascular Disease

## 2023-07-17 ENCOUNTER — Telehealth: Payer: Self-pay | Admitting: Gastroenterology

## 2023-07-17 DIAGNOSIS — Z79899 Other long term (current) drug therapy: Secondary | ICD-10-CM

## 2023-07-17 NOTE — Telephone Encounter (Signed)
 Called and spoke with patient regarding Dr. Myrtie Neither' recommendations. Pt verbalized understanding and had no concerns at the end of the call.

## 2023-07-17 NOTE — Telephone Encounter (Signed)
 FWD to pharmacy for input/advisement

## 2023-07-17 NOTE — Telephone Encounter (Signed)
 I am not sure how to answer that question since it is not a medicine that I prescribe.  I looked it up and it appears to be for multiple indications, most likely for his alopecia.  The drug database I looked at indicates there are some potential digestive side effects, but there is no way of knowing if he might experience any of these.  He does not have any known GI issues that would appear to be absolute contraindications to this medicine.  Would be best for him to speak with his dermatologist if he has particular concerns or questions about this medicine they are prescribed.  H Danis

## 2023-07-17 NOTE — Telephone Encounter (Signed)
 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 Please advise

## 2023-07-17 NOTE — Telephone Encounter (Signed)
 Please see alternate 1/10 Telephone encounter for documentation

## 2023-07-17 NOTE — Telephone Encounter (Signed)
Dr. Danis, please see note below and advise. Thanks 

## 2023-07-17 NOTE — Telephone Encounter (Signed)
 Patient said that his Dermotologist got him taking the medication Olumiant and he want to make sure that it is ok for him to take due to him having afib. Please call back

## 2023-07-17 NOTE — Telephone Encounter (Signed)
 Patient inquiring if it is ok for him to take Ouimiant medication prescribed by his dermatologist.   Please advise. Thank you

## 2023-07-20 NOTE — Telephone Encounter (Signed)
 No concerns for his using Olumiant with atrial fibrillation.  They do note a potential rise in LDL with this medication, so should repeat lipid labs after 3 months and adjust statin accordingly.

## 2023-07-20 NOTE — Telephone Encounter (Signed)
 Patient identification verified by 2 forms. Raymond Cooks, RN    Called and spoke to patient  Relayed message below  Patient states:   -would like provider to review 5/20 CT results   -concerned about possible PFO  Informed patient:   -lipid lab orders placed, can plan to present to lab in 3 months   -Dr. Barbaraann reviewed CT results, message sent per request  Patient has no further questions at this time

## 2023-07-21 NOTE — Telephone Encounter (Signed)
 Patient identification verified by 2 forms. Marilynn Rail, RN    Called and spoke to patient  Relayed provider message below  Patient verbalized understanding, no questions at this time

## 2023-07-21 NOTE — Telephone Encounter (Signed)
 Sande Rives, MD      No, this is a normal finding in up to 25% of patients. Nothing to worry about.  Gerri Spore T. Flora Lipps, MD, [facility]  [facility] [address], [address] [place], [place] [address] [phone] 6:24 PM

## 2023-08-04 ENCOUNTER — Other Ambulatory Visit: Payer: Self-pay

## 2023-08-04 ENCOUNTER — Encounter: Payer: Self-pay | Admitting: Allergy & Immunology

## 2023-08-04 ENCOUNTER — Ambulatory Visit: Payer: BC Managed Care – PPO | Admitting: Allergy & Immunology

## 2023-08-04 VITALS — BP 122/76 | HR 74 | Temp 98.2°F | Resp 16 | Ht 67.0 in | Wt 201.3 lb

## 2023-08-04 DIAGNOSIS — R768 Other specified abnormal immunological findings in serum: Secondary | ICD-10-CM | POA: Diagnosis not present

## 2023-08-04 DIAGNOSIS — R5383 Other fatigue: Secondary | ICD-10-CM | POA: Diagnosis not present

## 2023-08-04 DIAGNOSIS — L639 Alopecia areata, unspecified: Secondary | ICD-10-CM | POA: Diagnosis not present

## 2023-08-04 NOTE — Patient Instructions (Addendum)
1. Alopecia areata - We are going some labs to evaluate you immune system. - I am not convinced that this is related to everything that is going on, but we will see what it shows.  - We are also going to get inflammatory markers again and an ANA to try to get a recent one that is elevated. - You had one that was elevated in 2023 (1:160), so I am going to put in a referral to Rheumatology. - This should be enough to get you in to be seen.   2. Return if symptoms worsen or fail to improve. You can have the follow up appointment with Dr. Dellis Anes or a Nurse Practicioner (our Nurse Practitioners are excellent and always have Physician oversight!).    Please inform us of any Emergency Department visits, hospitalizations, or changes in symptoms. Call us before going to the ED for breathing or allergy symptoms since we might be able to fit you in for a sick visit. Feel free to contact us anytime with any questions, problems, or concerns.  It was a pleasure to meet you and your family today! What an interesting story!   Websites that have reliable patient information: 1. American Academy of Asthma, Allergy, and Immunology: www.aaaai.org 2. Food Allergy Research and Education (FARE): foodallergy.org 3. Mothers of Asthmatics: http://www.asthmacommunitynetwork.org 4. American College of Allergy, Asthma, and Immunology: www.acaai.org      "Like" [place] on Facebook and Instagram for our latest updates!      A healthy democracy works best when [employer] participate! Make sure you are registered to vote! If you have moved or changed any of your contact information, you will need to get this updated before voting! Scan the QR codes below to learn more!

## 2023-08-04 NOTE — Progress Notes (Unsigned)
NEW PATIENT  Date of Service/Encounter:  08/04/23  Consult requested by: Raymond Kill, PA-C   Assessment:   Alopecia areata  Fatigue   Elevated antinuclear antibody  Brain fog - seemed to be triggered after than ERCP from a gallbladder evaluation and worsened following a COVID19 infection in 2021  Maternal history of RA   Paternal history of UC  Plan/Recommendations:   1. Alopecia areata - We are going some labs to evaluate you immune system. - I am not convinced that this is related to everything that is going on, but we will see what it shows.  - We are also going to get inflammatory markers again and an ANA to try to get a recent one that is elevated. - You had one that was elevated in 2023 (1:160), so I am going to put in a referral to Rheumatology. - This should be enough to get you in to be seen.   2. Return if symptoms worsen or fail to improve. You can have the follow up appointment with Dr. Dellis Kelly or a Nurse Practicioner (our Nurse Practitioners are excellent and always have Physician oversight!).    Please inform us of any Emergency Department visits, hospitalizations, or changes in symptoms. Call us before going to the ED for breathing or allergy symptoms since we might be able to fit you in for a sick visit. Feel free to contact us anytime with any questions, problems, or concerns.  It was a pleasure to meet you and your family today! What an interesting story!   Websites that have reliable patient information: 1. American Academy of Asthma, Allergy, and Immunology: www.aaaai.org 2. Food Allergy Research and Education (FARE): foodallergy.org 3. Mothers of Asthmatics: http://www.asthmacommunitynetwork.org 4. American College of Allergy, Asthma, and Immunology: www.acaai.org      "Like" [place] on Facebook and Instagram for our latest updates!      A healthy democracy works best when [employer] participate! Make sure you are registered to vote!  If you have moved or changed any of your contact information, you will need to get this updated before voting! Scan the QR codes below to learn more!              {Blank single:19197::"This note in its entirety was forwarded to the Provider who requested this consultation."}  Subjective:   Raymond Kelly is a 53 y.o. male presenting today for evaluation of No chief complaint on file.   Raymond Kelly has a history of the following: Patient Active Problem List   Diagnosis Date Noted   New onset atrial fibrillation (HCC) 11/05/2022   OSA (obstructive sleep apnea) 07/09/2021   Insomnia 07/09/2021   Alopecia areata 08/28/2016   Hyperbilirubinemia 02/04/2016   Bile leak, postoperative     History obtained from: chart review and {Persons; PED relatives w/patient:19415::"patient"}.  Discussed the use of AI scribe software for clinical note transcription with the patient and/or guardian, who gave verbal consent to proceed.  Raymond Kelly was referred by Raymond Kill, PA-C.     Raymond Kelly is a 53 y.o. male presenting for {Blank single:19197::"a food challenge","a drug challenge","skin testing","a sick visit","an evaluation of ***","a follow up visit"}.  Discussed the use of AI scribe software for clinical note transcription with the patient, who gave verbal consent to proceed.  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 He is accompanied by his  wife, who is a Engineer, civil (consulting).  He has been experiencing autoimmune concerns primarily related to alopecia universalis, which began approximately five years ago following a cholecystectomy and subsequent bile leak. He has not pursued treatment with high-dose methotrexate due to potential liver effects and has not been on any specific medication for alopecia. He also mentions a high bilirubin level and a history of a positive ANA test.  He reports  significant brain fog and cognitive difficulties, described as 'forgetfulness' and 'inability to concentrate,' particularly after contracting COVID-19. He has undergone extensive workups, including evaluations by neurology and cardiology, and was found to have atrial fibrillation in June 2023, which resolved without the need for ablation. He is currently on Wellbutrin to aid with focus but continues to experience cognitive issues.  No frequent infections such as sinus or ear infections, but he notes occasional night sweats and a general feeling of being 'off balance.' He has experienced dizziness in the past, which has improved, but still struggles with mental clarity and focus.  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 He has been in this industry for over thirty years and is attempting to reduce work-related stress by stepping back from some responsibilities.     He never took the MTX or other immunosuppressants. He has been having the "brain fog". He saw a Neurologist and this workup was all negative.   He went into a new position at his current workplace with more responsibility and stress. He did Wellbutrin and is getting counseling. The main concern is difficultly concentrating  2021 patient had COVID and associated brain fog, confusion.  He went to the hospital for evaluation and MRI and EEG at that time were unremarkable.  Symptoms slightly improved but did not resolve.  He went to [facility] and he was only positive to whey at "2+".       Asthma/Respiratory Symptom History: ***  Allergic Rhinitis Symptom History: ***  Food Allergy Symptom History: ***  Skin Symptom History: ***  GERD Symptom History: ***  Infection Symptom History: ***  ***Otherwise, there is no history of other atopic diseases, including {Blank multiple:19196:o:"asthma","food allergies","drug  allergies","environmental allergies","stinging insect allergies","eczema","urticaria","contact dermatitis"}. There is no significant infectious history. ***Vaccinations are up to date.    Past Medical History: Patient Active Problem List   Diagnosis Date Noted   New onset atrial fibrillation (HCC) 11/05/2022   OSA (obstructive sleep apnea) 07/09/2021   Insomnia 07/09/2021   Alopecia areata 08/28/2016   Hyperbilirubinemia 02/04/2016   Bile leak, postoperative     Medication List:  Allergies as of 08/04/2023   No Known Allergies      Medication List        Accurate as of August 04, 2023 12:29 PM. If you have any questions, ask your nurse or doctor.          buPROPion 150 MG 24 hr tablet Commonly known as: WELLBUTRIN XL Take 150 mg by mouth every morning.   rosuvastatin 20 MG tablet Commonly known as: CRESTOR Take 1 tablet (20 mg total) by mouth daily.        Birth History: {Blank single:19197::"non-contributory","born premature and spent time in the NICU","born at term without complications"}  Developmental History: Raymond Kelly has met all milestones on time. He has required no {Blank multiple:19196:a:"speech therapy","occupational therapy","physical therapy"}. ***non-contributory  Past Surgical History: Past Surgical History:  Procedure Laterality Date   BIOPSY  09/30/2021   Procedure: BIOPSY;  Surgeon: Sherrilyn Rist, MD;  Location: [facility];  Service: Gastroenterology;;   CHOLECYSTECTOMY N/A 02/01/2016   Procedure: LAPAROSCOPIC CHOLECYSTECTOMY WITH INTRAOPERATIVE CHOLANGIOGRAM;  Surgeon: Abigail Miyamoto, MD;  Location: [facility];  Service: General;  Laterality: N/A;   COLONOSCOPY     COLONOSCOPY WITH PROPOFOL N/A 09/30/2021   Procedure: COLONOSCOPY WITH PROPOFOL;  Surgeon: Sherrilyn Rist, MD;  Location: [facility];  Service: Gastroenterology;  Laterality: N/A;   cyst removed     between his front teeth and cyst removed from abdomal area    ESOPHAGOGASTRODUODENOSCOPY (EGD) WITH PROPOFOL N/A 03/06/2016   Procedure: ESOPHAGOGASTRODUODENOSCOPY (EGD) WITH PROPOFOL;  Surgeon: Sherrilyn Rist, MD;  Location: [facility];  Service: Gastroenterology;  Laterality: N/A;  with stent removal.   GASTROINTESTINAL STENT REMOVAL N/A 03/06/2016   Procedure: GASTROINTESTINAL STENT REMOVAL;  Surgeon: Sherrilyn Rist, MD;  Location: [facility];  Service: Gastroenterology;  Laterality: N/A;   POLYPECTOMY  09/30/2021   Procedure: POLYPECTOMY;  Surgeon: Sherrilyn Rist, MD;  Location: [facility];  Service: Gastroenterology;;   TONSILLECTOMY     ULNAR NERVE TRANSPOSITION     VASECTOMY       Family History: Family History  Problem Relation Age of Onset   Heart attack Mother    Ulcerative colitis Mother    Irritable bowel syndrome Mother    Hypertension Mother    Stroke Father    Arthritis Father    ALS Father    Neuropathy Father    Hypertension Father    Other Father        "open stomach"   Asthma Sister    Diabetes Brother    Pancreatic cancer Maternal Grandfather    Juvenile Diabetes Niece    Colon cancer Neg Hx    Rectal cancer Neg Hx    Stomach cancer Neg Hx    Esophageal cancer Neg Hx      Social History: Raymond Kelly lives at home with ***.  They live in a house that is 55 years old.  There is hardwood throughout the home.  They have electric heating and central cooling.  There is a dog inside of the home.  There are no dust mite covers on the bedding.  There is no tobacco exposure.  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  There is fume, chemical, and dust exposure.  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  There is no HEPA filter in the home.  He does not [place] an interstate or industrial area.   Review of systems otherwise negative other than that mentioned in the HPI.    Objective:   Blood pressure 122/76, pulse 74, temperature 98.2 F (36.8 C), temperature  source Temporal, resp. rate 16, height 5\' 7"  (1.702 m), weight 201 lb 4.8 oz (91.3 kg), SpO2 96%. Body mass index is 31.53 kg/m.     Physical Exam   Diagnostic studies: {Blank single:19197::"none","deferred due to recent antihistamine use","deferred due to insurance stipulations that require a separate visit for testing","labs sent instead"," "}  Spirometry: {Blank single:19197::"results normal (FEV1: ***%, FVC: ***%, FEV1/FVC: ***%)","results abnormal (FEV1: ***%, FVC: ***%, FEV1/FVC: ***%)"}.    {Blank single:19197::"Spirometry consistent with mild obstructive disease","Spirometry consistent with moderate obstructive disease","Spirometry consistent with severe obstructive disease","Spirometry consistent with possible restrictive disease","Spirometry consistent with mixed obstructive and restrictive disease","Spirometry uninterpretable due to technique","Spirometry consistent with normal pattern"}. {Blank single:19197::"Albuterol/Atrovent nebulizer","Xopenex/Atrovent nebulizer","Albuterol nebulizer","Albuterol four puffs via MDI","Xopenex  four puffs via MDI"} treatment given in clinic with {Blank single:19197::"significant improvement in FEV1 per ATS criteria","significant improvement in FVC per ATS criteria","significant improvement in FEV1 and FVC per ATS criteria","improvement in FEV1, but not significant per ATS criteria","improvement in FVC, but not significant per ATS criteria","improvement in FEV1 and FVC, but not significant per ATS criteria","no improvement"}.  Allergy Studies: {Blank single:19197::"none","deferred due to recent antihistamine use","deferred due to insurance stipulations that require a separate visit for testing","labs sent instead"," "}    {Blank single:19197::"Allergy testing results were read and interpreted by myself, documented by clinical staff."," "}         Malachi Bonds, MD [facility]

## 2023-08-05 ENCOUNTER — Telehealth: Payer: Self-pay

## 2023-08-05 NOTE — Telephone Encounter (Signed)
Patient identification verified by 2 forms. Shade Flood, RN     Response letter sent to [facility] regarding patient starting JAK inhibitor Oluminant.

## 2023-08-06 ENCOUNTER — Encounter: Payer: Self-pay | Admitting: Allergy & Immunology

## 2023-08-12 ENCOUNTER — Encounter: Payer: Self-pay | Admitting: Diagnostic Neuroimaging

## 2023-08-12 ENCOUNTER — Ambulatory Visit: Payer: BC Managed Care – PPO | Admitting: Diagnostic Neuroimaging

## 2023-08-12 VITALS — BP 125/72 | HR 61 | Ht 68.0 in | Wt 199.0 lb

## 2023-08-12 DIAGNOSIS — G4700 Insomnia, unspecified: Secondary | ICD-10-CM

## 2023-08-12 DIAGNOSIS — R413 Other amnesia: Secondary | ICD-10-CM | POA: Diagnosis not present

## 2023-08-12 DIAGNOSIS — R4189 Other symptoms and signs involving cognitive functions and awareness: Secondary | ICD-10-CM

## 2023-08-12 DIAGNOSIS — F419 Anxiety disorder, unspecified: Secondary | ICD-10-CM | POA: Diagnosis not present

## 2023-08-12 DIAGNOSIS — R41840 Attention and concentration deficit: Secondary | ICD-10-CM | POA: Diagnosis not present

## 2023-08-12 NOTE — Progress Notes (Signed)
 [facility]  PATIENT: Raymond Kelly DOB: [birth year]  REFERRING CLINICIAN: Trudy Elodia PARAS, * HISTORY FROM: patient REASON FOR VISIT: new consult   HISTORICAL  CHIEF COMPLAINT:  Chief Complaint  Patient presents with   Follow-up    Pt in room 7. Here for worsening     HISTORY OF PRESENT ILLNESS:   UPDATE (08/12/23, VRP): Since last visit, continues with memory, focus and anxiety issues. Started buproprion 7 months ago.  Maternal grandmother had dementia (starting age 24 years old; passed away 55 years old). No other fam hx of dementia.   PRIOR HPI (12/11/21, VRP): 53 year old male here for evaluation of dizziness, confusion, forgetfulness, language difficulty, attention difficulty.  2021 patient had COVID and associated brain fog, confusion.  He went to the hospital for evaluation and MRI and EEG at that time were unremarkable.  Symptoms slightly improved but did not resolve.  Over the past 7 to 8 months patient is also having constellation of other issues mainly related to stomach pain, diverticulitis, anxiety, insomnia, work-related stress.  Symptoms have progressively worsened over time.  He was averaging 3 hours of sleep per night.  He has recently been started on Lunesta for the past 2 months.  Also started on sertraline  2 weeks ago.  Still having issues related to anxiety and insomnia, now averaging 4 hours per night.  When patient was younger and in school he had difficult time with academics and concentration.  He was able to complete high school.  He has been working the same job for the last 30 years.  He has not been officially evaluated or tested for ADHD.   REVIEW OF SYSTEMS: Full 14 system review of systems performed and negative with exception of: as per HPI.  ALLERGIES: No Known Allergies  HOME MEDICATIONS: Outpatient Medications Prior to Visit  Medication Sig Dispense Refill   buPROPion (WELLBUTRIN XL) 150 MG 24 hr tablet Take 150 mg by  mouth every morning.     rosuvastatin  (CRESTOR ) 20 MG tablet Take 1 tablet (20 mg total) by mouth daily. 90 tablet 3   No facility-administered medications prior to visit.    PAST MEDICAL HISTORY: Past Medical History:  Diagnosis Date   Alopecia areata    Diverticulitis    Dizziness    Eczema    Family history of adverse reaction to anesthesia    mother slow to awaken   History of kidney stones 2007    PAST SURGICAL HISTORY: Past Surgical History:  Procedure Laterality Date   BIOPSY  09/30/2021   Procedure: BIOPSY;  Surgeon: Legrand Victory LITTIE DOUGLAS, MD;  Location: [facility];  Service: Gastroenterology;;   CHOLECYSTECTOMY N/A 02/01/2016   Procedure: LAPAROSCOPIC CHOLECYSTECTOMY WITH INTRAOPERATIVE CHOLANGIOGRAM;  Surgeon: Vicenta Poli, MD;  Location: [facility];  Service: General;  Laterality: N/A;   COLONOSCOPY     COLONOSCOPY WITH PROPOFOL  N/A 09/30/2021   Procedure: COLONOSCOPY WITH PROPOFOL ;  Surgeon: Legrand Victory LITTIE DOUGLAS, MD;  Location: [facility];  Service: Gastroenterology;  Laterality: N/A;   cyst removed     between his front teeth and cyst removed from abdomal area   ESOPHAGOGASTRODUODENOSCOPY (EGD) WITH PROPOFOL  N/A 03/06/2016   Procedure: ESOPHAGOGASTRODUODENOSCOPY (EGD) WITH PROPOFOL ;  Surgeon: Victory LITTIE Legrand DOUGLAS, MD;  Location: [facility];  Service: Gastroenterology;  Laterality: N/A;  with stent removal.   GASTROINTESTINAL STENT REMOVAL N/A 03/06/2016   Procedure: GASTROINTESTINAL STENT REMOVAL;  Surgeon: Victory LITTIE Legrand DOUGLAS, MD;  Location: [facility];  Service: Gastroenterology;  Laterality: N/A;   POLYPECTOMY  09/30/2021   Procedure: POLYPECTOMY;  Surgeon: Legrand Victory LITTIE DOUGLAS, MD;  Location: [facility];  Service: Gastroenterology;;   TONSILLECTOMY     ULNAR NERVE TRANSPOSITION     VASECTOMY      FAMILY HISTORY: Family History  Problem Relation Age of Onset   Heart attack Mother    Ulcerative colitis Mother    Irritable bowel syndrome Mother     Hypertension Mother    Stroke Father    Arthritis Father    ALS Father    Neuropathy Father    Hypertension Father    Other Father        open stomach   Asthma Sister    Diabetes Brother    Pancreatic cancer Maternal Grandfather    Juvenile Diabetes Niece    Colon cancer Neg Hx    Rectal cancer Neg Hx    Stomach cancer Neg Hx    Esophageal cancer Neg Hx     SOCIAL HISTORY: Social History   Socioeconomic History   Marital status: Married    Spouse name: Kate   Number of children: 2   Years of education: 16   Highest education level: Bachelor's degree (e.g., BA, AB, BS)  Occupational History   Occupation: sales  Tobacco Use   Smoking status: Never    Passive exposure: Never   Smokeless tobacco: Never   Tobacco comments:    Never smoke 11/04/22  Vaping Use   Vaping status: Never Used  Substance and Sexual Activity   Alcohol use: Yes    Comment: social at times   Drug use: No   Sexual activity: Yes  Other Topics Concern   Not on file  Social History Narrative   Lives with wife in [place]    Social Drivers of Health   Financial Resource Strain: Not on file  Food Insecurity: Low Risk  (10/10/2022)   [facility]   Hunger Vital Sign    Worried About Running Out of Food in the Last Year: Never true    Ran Out of Food in the Last Year: Never true  Transportation Needs: No Transportation Needs (10/10/2022)   [facility]   Transportation    In the past 12 months, has lack of reliable transportation kept you from medical appointments, meetings, work or from getting things needed for daily living? : No  Physical Activity: Not on file  Stress: Not on file  Social Connections: Not on file  Intimate Partner Violence: Not on file     PHYSICAL EXAM  GENERAL EXAM/CONSTITUTIONAL: Vitals:  Vitals:   08/12/23 0958  Weight: 199 lb (90.3 kg)  Height: 5' 8 (1.727 m)   Body mass index is 30.26  kg/m. Wt Readings from Last 3 Encounters:  08/12/23 199 lb (90.3 kg)  08/04/23 201 lb 4.8 oz (91.3 kg)  04/09/23 196 lb (88.9 kg)   Patient is in no distress; well developed, nourished and groomed; neck is supple  CARDIOVASCULAR: Examination of carotid arteries is normal; no carotid bruits Regular rate and rhythm, no murmurs Examination of peripheral vascular system by observation and palpation is normal  EYES: Ophthalmoscopic exam of optic discs and posterior segments is normal; no papilledema or hemorrhages No results found.  MUSCULOSKELETAL: Gait, strength, tone, movements noted in Neurologic exam below  NEUROLOGIC: MENTAL STATUS:      No data to display         awake,  alert, oriented to person, place and time recent and remote memory intact normal attention and concentration language fluent, comprehension intact, naming intact fund of knowledge appropriate  CRANIAL NERVE:  2nd - no papilledema on fundoscopic exam 2nd, 3rd, 4th, 6th - pupils equal and reactive to light, visual fields full to confrontation, extraocular muscles intact, no nystagmus 5th - facial sensation symmetric 7th - facial strength symmetric 8th - hearing intact 9th - palate elevates symmetrically, uvula midline 11th - shoulder shrug symmetric 12th - tongue protrusion midline  MOTOR:  normal bulk and tone, full strength in the BUE, BLE  SENSORY:  normal and symmetric to light touch, temperature, vibration  COORDINATION:  finger-nose-finger, fine finger movements normal  REFLEXES:  deep tendon reflexes present and symmetric  GAIT/STATION:  narrow based gait     DIAGNOSTIC DATA (LABS, IMAGING, TESTING) - I reviewed patient records, labs, notes, testing and imaging myself where available.  Lab Results  Component Value Date   WBC 6.0 10/30/2022   HGB 15.3 10/30/2022   HCT 44.8 10/30/2022   MCV 93.5 10/30/2022   PLT 221 10/30/2022      Component Value Date/Time   NA 142  11/21/2022 0832   K 4.1 11/21/2022 0832   CL 104 11/21/2022 0832   CO2 24 11/21/2022 0832   GLUCOSE 88 11/21/2022 0832   GLUCOSE 101 (H) 10/30/2022 2150   BUN 15 11/21/2022 0832   CREATININE 1.24 11/21/2022 0832   CALCIUM  9.2 11/21/2022 0832   PROT 6.8 08/06/2021 1727   ALBUMIN 4.1 08/06/2021 1727   AST 16 08/06/2021 1727   ALT 16 08/06/2021 1727   ALKPHOS 82 08/06/2021 1727   BILITOT 0.9 08/06/2021 1727   GFRNONAA >60 10/30/2022 2150   GFRAA >60 09/13/2019 0941   Lab Results  Component Value Date   CHOL 235 (H) 11/21/2022   HDL 52 11/21/2022   LDLCALC 170 (H) 11/21/2022   TRIG 76 11/21/2022   CHOLHDL 4.5 11/21/2022   No results found for: HGBA1C No results found for: VITAMINB12 Lab Results  Component Value Date   TSH 1.373 09/13/2019   Component Ref Range & Units 10 mo ago  TSH 0.450 - 5.330 uIU/mL 1.401   Component Ref Range & Units 10 mo ago Comments  Vitamin B-12 180 - 914 pg/mL 966 High     09/13/19 MRI brain [I reviewed images myself and agree with interpretation. -VRP]  -Normal MRI brain with contrast -Sinus mucosal disease.  09/13/19 EEG - This study is within normal limits. No seizures or epileptiform discharges were seen throughout the recording.   ASSESSMENT AND PLAN  53 y.o. year old male here with:  Dx:  1. Memory loss   2. Brain fog   3. Attention disturbance   4. Anxiety   5. Insomnia, unspecified type     PLAN:  ATTENTION / COGNITIVE DIFFICULTIES / ANXIETY / INSOMNIA (since ~2021) - continue bupropion (consider psychiatry / psychology eval for treatment of anxiety / insomnia / ADD; has tried sertraline , lexapro, bupropion, trazodone  in past) - refer to neuropsychology testing  MILD OSA - follow up with Dr. Neysa for CPAP  Orders Placed This Encounter  Procedures   Ambulatory referral to Neuropsychology   Return for pending test results, pending if symptoms worsen or fail to improve.     EDUARD FABIENE HANLON, MD 08/12/2023,  9:59 AM Certified in Neurology, Neurophysiology and Neuroimaging  [facility] [address], [address] [place], [place] [address] [phone]

## 2023-08-12 NOTE — Patient Instructions (Signed)
 ATTENTION / COGNITIVE DIFFICULTIES / ANXIETY / INSOMNIA (since ~2021) - continue bupropion (consider psychiatry / psychology eval for treatment of anxiety / insomnia / ADD; has tried sertraline , lexapro, bupropion, trazodone  in past) - refer to neuropsychology testing

## 2023-08-13 ENCOUNTER — Encounter: Payer: Self-pay | Admitting: Allergy & Immunology

## 2023-08-13 LAB — STREP PNEUMONIAE 23 SEROTYPES IGG
Pneumo Ab Type 1*: 0.4 ug/mL — ABNORMAL LOW
Pneumo Ab Type 12 (12F)*: 2.8 ug/mL
Pneumo Ab Type 14*: 7.6 ug/mL
Pneumo Ab Type 17 (17F)*: 1.8 ug/mL
Pneumo Ab Type 19 (19F)*: 1.3 ug/mL — ABNORMAL LOW
Pneumo Ab Type 2*: <0.2 ug/mL — ABNORMAL LOW
Pneumo Ab Type 20*: 3.2 ug/mL
Pneumo Ab Type 22 (22F)*: 0.4 ug/mL — ABNORMAL LOW
Pneumo Ab Type 23 (23F)*: 0.4 ug/mL — ABNORMAL LOW
Pneumo Ab Type 26 (6B)*: <0.1 ug/mL — ABNORMAL LOW
Pneumo Ab Type 3*: <0.1 ug/mL — ABNORMAL LOW
Pneumo Ab Type 34 (10A)*: 3.4 ug/mL
Pneumo Ab Type 4*: 0.2 ug/mL — ABNORMAL LOW
Pneumo Ab Type 43 (11A)*: 2.1 ug/mL
Pneumo Ab Type 5*: 2.1 ug/mL
Pneumo Ab Type 51 (7F)*: 0.4 ug/mL — ABNORMAL LOW
Pneumo Ab Type 54 (15B)*: 1 ug/mL — ABNORMAL LOW
Pneumo Ab Type 56 (18C)*: 1.7 ug/mL
Pneumo Ab Type 57 (19A)*: 4.6 ug/mL
Pneumo Ab Type 68 (9V)*: 1.2 ug/mL — ABNORMAL LOW
Pneumo Ab Type 70 (33F)*: 1.1 ug/mL — ABNORMAL LOW
Pneumo Ab Type 8*: 0.9 ug/mL — ABNORMAL LOW
Pneumo Ab Type 9 (9N)*: 0.9 ug/mL — ABNORMAL LOW

## 2023-08-13 LAB — IGG 1, 2, 3, AND 4
IgG (Immunoglobin G), Serum: 973 mg/dL (ref 603–1613)
IgG, Subclass 1: 479 mg/dL (ref 248–810)
IgG, Subclass 2: 264 mg/dL (ref 130–555)
IgG, Subclass 3: 100 mg/dL (ref 15–102)
IgG, Subclass 4: 6 mg/dL (ref 2–96)

## 2023-08-13 LAB — SEDIMENTATION RATE: Sed Rate: 2 mm/h (ref 0–30)

## 2023-08-13 LAB — IGG, IGA, IGM
IgA/Immunoglobulin A, Serum: 132 mg/dL (ref 90–386)
IgM (Immunoglobulin M), Srm: 47 mg/dL (ref 20–172)

## 2023-08-13 LAB — DIPHTHERIA / TETANUS ANTIBODY PANEL
Diphtheria Ab: 0.45 [IU]/mL
Tetanus Ab, IgG: 0.26 [IU]/mL

## 2023-08-13 LAB — C-REACTIVE PROTEIN: CRP: <1 mg/L (ref 0–10)

## 2023-08-13 LAB — ANTINUCLEAR ANTIBODIES, IFA: ANA Titer 1: NEGATIVE

## 2023-08-13 LAB — COMPLEMENT, TOTAL: Compl, Total (CH50): >60 U/mL

## 2023-12-23 ENCOUNTER — Telehealth: Payer: Self-pay | Admitting: Gastroenterology

## 2023-12-23 NOTE — Telephone Encounter (Signed)
 PT is calling to share his lab results from yesterday with [facility]. He wants Dr. Dominic Friendly to review them to determine how to proceed with his new concerns. Please advise.

## 2023-12-23 NOTE — Telephone Encounter (Signed)
 The pt was seen at [facility] yesterday for elevated bilirubin.  See office visit and  labs in chart. The pt would like Dr Dominic Friendly to review while waiting for office visit.     Appt made to see Dr Dominic Friendly on 02/10/24 at 1020 am

## 2023-12-24 NOTE — Telephone Encounter (Signed)
 I see his liver chemistry labs from 2 days ago.  Slight elevation of bilirubin (primarily indirect bilirubin) with otherwise normal LFTs, which has been the case on some labs for at least a year now.  This is a very common and benign phenomenon called Gilbert's, it has no clinical impact on a person and does not need any further liver related testing.  Memory Staggers MD

## 2023-12-24 NOTE — Telephone Encounter (Signed)
 The pt has been advised of the information per Dr Dominic Friendly. He prefers to keep the appt to discuss further.

## 2023-12-29 NOTE — Progress Notes (Signed)
 Office Visit Note  Patient: Raymond Kelly             Date of Birth: [birth year]           MRN: [id number]             PCP: Debrah Josette ORN., PA-C Referring: Iva Marty Saltness, * Visit Date: 01/12/2024 Occupation: @GUAROCC @  Subjective:  Hair loss, positive ANA, fatigue  History of Present Illness: Raymond Kelly is a 53 y.o. male here to be evaluated for positive ANA, fatigue and hair loss.  According the patient his symptoms of hair loss that started in 2017 with a patchy hair loss on his arm.  Within a year he developed hair loss all over his body.  He states in 2019 he developed COVID-19 virus infection followed by increased fatigue and brain fog.  He states the symptoms lingered on for a long time and never resolved.  He was diagnosed with long COVID.  In 2017 he also underwent cholecystectomy.  Since then he has had some infrequent diarrhea.  He is also noticed some blood in his stool.  He was evaluated by Dr. Legrand. Bilirubin was elevated which was attributed to Gilbert's disease.  He had colonoscopy, CT abdomen and ultrasound of the abdomen.  He was diagnosed with IBS with diarrhea.  He has had frequent courses of antibiotics.  According the patient he has had diverticulitis but I do not see documentation in the chart. He was evaluated by Dr. Mollie in May 2023 and was diagnosed with alopecia areata totalis.  I reviewed her last office visit note from July 13, 2023.  He she discussed possible treatment with baricitinib.  She decided to avoid methotrexate due to elevated LFTs.  Patient declined check inhibitors. He has been experiencing fatigue and forgetfulness.  He had an extensive workup by Dr. Margaret.  MRI of the brain with contrast was normal.  EEG was normal.  He diagnosed with attention/cognitive difficulties, anxiety and insomnia has an mild obstructive sleep apnea.  He has been using CPAP.  He suggested cognitive testing. He reports frequency of urination for many years.   He has to get up at night about 3 times.  Sometimes he has to get up 7-8 times infrequently.  He denies any history of dysuria. There is no history of oral ulcers, nasal ulcers, sicca symptoms, malar rash, Raynaud's, photosensitivity or lymphadenopathy.  There is no history of inflammatory arthritis. 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  He states his work is a stressful.  He is right-handed married.  He has 2 healthy children.  He enjoys doing pool maintenance.  He does not have much time to exercise but likes biking.  He drinks alcohol only occasionally.  He is non-smoker.  Activities of Daily Living:  Patient reports morning stiffness for  none.   Patient Denies nocturnal pain.  Difficulty dressing/grooming: Denies Difficulty climbing stairs: Denies Difficulty getting out of chair: Denies Difficulty using hands for taps, buttons, cutlery, and/or writing: Denies  Review of Systems  Constitutional:  Positive for fatigue.  HENT:  Negative for mouth sores and mouth dryness.   Eyes:  Negative for dryness.  Respiratory:  Negative for shortness of breath.   Cardiovascular:  Negative for chest pain and palpitations.  Gastrointestinal:  Positive for blood in stool and diarrhea. Negative for constipation.  Endocrine: Positive for increased urination.  Genitourinary:  Negative for involuntary urination.  Musculoskeletal:  Negative for joint pain, gait problem,  joint pain, joint swelling, myalgias, muscle weakness, morning stiffness, muscle tenderness and myalgias.  Skin:  Negative for color change, rash, hair loss and sensitivity to sunlight.  Allergic/Immunologic: Negative for susceptible to infections.  Neurological:  Positive for light-headedness. Negative for dizziness and headaches.  Hematological:  Negative for swollen glands.  Psychiatric/Behavioral:  Positive for depressed mood and sleep disturbance. The patient is nervous/anxious.     PMFS History:  Patient Active Problem List    Diagnosis Date Noted   New onset atrial fibrillation (HCC) 11/05/2022   OSA (obstructive sleep apnea) 07/09/2021   Insomnia 07/09/2021   Alopecia areata 08/28/2016   Hyperbilirubinemia 02/04/2016   Bile leak, postoperative     Past Medical History:  Diagnosis Date   Alopecia areata    Diverticulitis    Dizziness    Eczema    Family history of adverse reaction to anesthesia    mother slow to awaken   History of kidney stones 2007   Ulcerative colitis (HCC) 2023    Family History  Problem Relation Age of Onset   Heart attack Mother    Ulcerative colitis Mother    Irritable bowel syndrome Mother    Hypertension Mother    Stroke Father    Arthritis Father    ALS Father    Neuropathy Father    Hypertension Father    Other Father        open stomach   Migraines Sister    Asthma Sister    Healthy Sister    Diabetes Brother    Pancreatic cancer Maternal Grandfather    Juvenile Diabetes Niece    Colon cancer Neg Hx    Rectal cancer Neg Hx    Stomach cancer Neg Hx    Esophageal cancer Neg Hx    Past Surgical History:  Procedure Laterality Date   BIOPSY  09/30/2021   Procedure: BIOPSY;  Surgeon: Legrand Victory LITTIE DOUGLAS, MD;  Location: [facility];  Service: Gastroenterology;;   CHOLECYSTECTOMY N/A 02/01/2016   Procedure: LAPAROSCOPIC CHOLECYSTECTOMY WITH INTRAOPERATIVE CHOLANGIOGRAM;  Surgeon: Vicenta Poli, MD;  Location: [facility];  Service: General;  Laterality: N/A;   COLONOSCOPY     COLONOSCOPY WITH PROPOFOL  N/A 09/30/2021   Procedure: COLONOSCOPY WITH PROPOFOL ;  Surgeon: Legrand Victory LITTIE DOUGLAS, MD;  Location: [facility];  Service: Gastroenterology;  Laterality: N/A;   cyst removed     between his front teeth and cyst removed from abdomal area   ESOPHAGOGASTRODUODENOSCOPY (EGD) WITH PROPOFOL  N/A 03/06/2016   Procedure: ESOPHAGOGASTRODUODENOSCOPY (EGD) WITH PROPOFOL ;  Surgeon: Victory LITTIE Legrand DOUGLAS, MD;  Location: [facility];  Service: Gastroenterology;  Laterality: N/A;   with stent removal.   GASTROINTESTINAL STENT REMOVAL N/A 03/06/2016   Procedure: GASTROINTESTINAL STENT REMOVAL;  Surgeon: Victory LITTIE Legrand DOUGLAS, MD;  Location: [facility];  Service: Gastroenterology;  Laterality: N/A;   POLYPECTOMY  09/30/2021   Procedure: POLYPECTOMY;  Surgeon: Legrand Victory LITTIE DOUGLAS, MD;  Location: [facility];  Service: Gastroenterology;;   TONSILLECTOMY     ULNAR NERVE TRANSPOSITION     VASECTOMY     Social History   Social History Narrative   Lives with wife in [place]    Immunization History  Administered Date(s) Administered   PFIZER(Purple Top)SARS-COV-2 Vaccination 07/26/2019, 08/16/2019     Objective: Vital Signs: BP 125/79 (BP Location: Right Arm, Patient Position: Sitting, Cuff Size: Normal)   Pulse 64   Resp 16   Ht 5' 8.5 (1.74 m)   Wt 197 lb 3.2 oz (  89.4 kg)   BMI 29.55 kg/m    Physical Exam Vitals and nursing note reviewed.  Constitutional:      Appearance: He is well-developed.  HENT:     Head: Normocephalic and atraumatic.  Eyes:     Conjunctiva/sclera: Conjunctivae normal.     Pupils: Pupils are equal, round, and reactive to light.  Cardiovascular:     Rate and Rhythm: Normal rate and regular rhythm.     Heart sounds: Normal heart sounds.  Pulmonary:     Effort: Pulmonary effort is normal.     Breath sounds: Normal breath sounds.  Abdominal:     General: Bowel sounds are normal.     Palpations: Abdomen is soft.  Musculoskeletal:     Cervical back: Normal range of motion and neck supple.  Skin:    General: Skin is warm and dry.     Capillary Refill: Capillary refill takes less than 2 seconds.  Neurological:     Mental Status: He is alert and oriented to person, place, and time.  Psychiatric:        Behavior: Behavior normal.      Musculoskeletal Exam: Cervical, thoracic and lumbar spine were in good range of motion.  Shoulder joints, elbow joints, wrist joints, MCPs PIPs and DIPs with good range of motion with no  synovitis.  Hip joints, knee joints, ankles, MTPs and PIPs with good range of motion with no synovitis.  He had mild tenderness over the left heel.  CDAI Exam: CDAI Score: -- Patient Global: --; Provider Global: -- Swollen: --; Tender: -- Joint Exam 01/12/2024   No joint exam has been documented for this visit   There is currently no information documented on the homunculus. Go to the Rheumatology activity and complete the homunculus joint exam.  Investigation: No additional findings.  Imaging: No results found.  Recent Labs: Lab Results  Component Value Date   WBC 6.0 10/30/2022   HGB 15.3 10/30/2022   PLT 221 10/30/2022   NA 142 11/21/2022   K 4.1 11/21/2022   CL 104 11/21/2022   CO2 24 11/21/2022   GLUCOSE 88 11/21/2022   BUN 15 11/21/2022   CREATININE 1.24 11/21/2022   BILITOT 0.9 08/06/2021   ALKPHOS 82 08/06/2021   AST 16 08/06/2021   ALT 16 08/06/2021   PROT 6.8 08/06/2021   ALBUMIN 4.1 08/06/2021   CALCIUM  9.2 11/21/2022   GFRAA >60 09/13/2019    Speciality Comments: No specialty comments available.  Procedures:  No procedures performed Allergies: Patient has no known allergies.   Assessment / Plan:     Visit Diagnoses: Positive ANA (antinuclear antibody) - 08/04/23: ANA Negative, ESR WNL, CRP WNL, CH50 >60, immunoglobulins WNL. 10/14/22: C3 and C4 WNL, ESR WNL -patient has positive ANA in the past which has been negative recently.  He is concerned that he has an underlying autoimmune disease which is causing fatigue, insomnia and hair loss.  He denies any history of oral ulcers, nasal ulcers, sicca symptoms, malar rash, photosensitivity, Raynaud's, lymphadenopathy, inflammatory arthritis.  No synovitis was noted on the examination today.  To complete the workup I will obtain following labs.  Plan: Protein / creatinine ratio, urine, ANA, Anti-scleroderma antibody, RNP Antibody, Anti-Smith antibody, Sjogrens syndrome-A extractable nuclear antibody, Sjogrens  syndrome-B extractable nuclear antibody, Anti-DNA antibody, double-stranded, C3 and C4  Other fatigue -he gives history of chronic fatigue.  He states fatigue became worse after COVID-19 infection in 2019.  He was diagnosed with long COVID.  He  is also experiencing brain fog.  Plan: Serum protein electrophoresis with reflex  Alopecia areata-he developed outpatient area 04/25/2016 has a patchy hair loss which gradually spread all over his body.  He was evaluated by Dr. Catarino and was diagnosed with alopecia areata totalis.  She offered baricitinib but patient declined.  Patient states that hair loss does not bother him.  Chronic heel pain, left-he has had discomfort in his left heel off-and-on.  He states he was diagnosed with a bone spur by podiatrist.  He also had a cortisone injection which gave him temporary relief.  Use of padded socks and shoes were discussed.  Hyperbilirubinemia-he was evaluated by Dr. Legrand and was diagnosed with Gilbert's disease.  Irritable bowel syndrome with diarrhea - Had colonoscopy by Dr. Legrand.  CT 2023 showed mild sigmoid thickening.  According to the chart review by gastroenterology patient was diagnosed with IBS with diarrhea.  He has had few courses of antibiotics.  Patient states that he was also diagnosed with diverticulitis but I could not find the information in the chart.  Vitamin D  deficiency -he gives history of chronic vitamin D  deficiency.  He states he forgets to take vitamin D .  Will check vitamin D  level today which could be contributing to fatigue.  Plan: VITAMIN D  25 Hydroxy (Vit-D Deficiency, Fractures)  Frequency of urination-because history of frequency of urination for the last several years.  He states he had been going to the bathroom 3 times at night and sometimes even 7-8 times.  He never had urology workup.  I will refer him to urology to rule out interstitial cystitis.  Brain fog-patient gives history of brain fog since COVID-19 virus  infection in 2019.  He was evaluated by Dr. Margaret and had extensive workup including MRI of his brain and EEG which was negative.  He was advised neuropsych testing.  I advised him to go for neuropsych testing.  Primary insomnia-he gives history of primary insomnia.  He is currently not taking any medications.  Good sleep hygiene was discussed.  OSA (obstructive sleep apnea)-patient has a CPAP but has not been using CPAP on a regular basis.  Orders: Orders Placed This Encounter  Procedures   Protein / creatinine ratio, urine   ANA   Anti-scleroderma antibody   RNP Antibody   Anti-Smith antibody   Sjogrens syndrome-A extractable nuclear antibody   Sjogrens syndrome-B extractable nuclear antibody   Anti-DNA antibody, double-stranded   C3 and C4   Serum protein electrophoresis with reflex   VITAMIN D  25 Hydroxy (Vit-D Deficiency, Fractures)   No orders of the defined types were placed in this encounter.   Face-to-face time spent with patient was over 45 minutes. Greater than 50% of time was spent in counseling and coordination of care.  Follow-Up Instructions: Return for +ANA, fatigue.   Maya Nash, MD  Note - This record has been created using Animal nutritionist.  Chart creation errors have been sought, but may not always  have been located. Such creation errors do not reflect on  the standard of medical care.

## 2024-01-12 ENCOUNTER — Ambulatory Visit: Payer: BC Managed Care – PPO | Attending: Rheumatology | Admitting: Rheumatology

## 2024-01-12 ENCOUNTER — Encounter: Payer: Self-pay | Admitting: Rheumatology

## 2024-01-12 VITALS — BP 125/79 | HR 64 | Resp 16 | Ht 68.5 in | Wt 197.2 lb

## 2024-01-12 DIAGNOSIS — R4189 Other symptoms and signs involving cognitive functions and awareness: Secondary | ICD-10-CM

## 2024-01-12 DIAGNOSIS — G4733 Obstructive sleep apnea (adult) (pediatric): Secondary | ICD-10-CM

## 2024-01-12 DIAGNOSIS — K580 Irritable bowel syndrome with diarrhea: Secondary | ICD-10-CM

## 2024-01-12 DIAGNOSIS — R768 Other specified abnormal immunological findings in serum: Secondary | ICD-10-CM | POA: Diagnosis not present

## 2024-01-12 DIAGNOSIS — R350 Frequency of micturition: Secondary | ICD-10-CM

## 2024-01-12 DIAGNOSIS — M79672 Pain in left foot: Secondary | ICD-10-CM

## 2024-01-12 DIAGNOSIS — G8929 Other chronic pain: Secondary | ICD-10-CM

## 2024-01-12 DIAGNOSIS — R5383 Other fatigue: Secondary | ICD-10-CM | POA: Diagnosis not present

## 2024-01-12 DIAGNOSIS — I4891 Unspecified atrial fibrillation: Secondary | ICD-10-CM

## 2024-01-12 DIAGNOSIS — L639 Alopecia areata, unspecified: Secondary | ICD-10-CM | POA: Diagnosis not present

## 2024-01-12 DIAGNOSIS — F5101 Primary insomnia: Secondary | ICD-10-CM

## 2024-01-12 DIAGNOSIS — E559 Vitamin D deficiency, unspecified: Secondary | ICD-10-CM

## 2024-01-17 LAB — SJOGRENS SYNDROME-A EXTRACTABLE NUCLEAR ANTIBODY: SSA (Ro) (ENA) Antibody, IgG: <1 AI

## 2024-01-17 LAB — PROTEIN / CREATININE RATIO, URINE
Creatinine, Urine: 119 mg/dL (ref 20–320)
Protein/Creat Ratio: 84 mg/g{creat} (ref 25–148)
Protein/Creatinine Ratio: 0.084 mg/mg{creat} (ref 0.025–0.148)
Total Protein, Urine: 10 mg/dL (ref 5–25)

## 2024-01-17 LAB — RNP ANTIBODY: Ribonucleic Protein(ENA) Antibody, IgG: <1 AI

## 2024-01-17 LAB — ANTI-DNA ANTIBODY, DOUBLE-STRANDED: ds DNA Ab: <1 [IU]/mL

## 2024-01-17 LAB — ANTI-SCLERODERMA ANTIBODY: Scleroderma (Scl-70) (ENA) Antibody, IgG: <1 AI

## 2024-01-17 LAB — PROTEIN ELECTROPHORESIS, SERUM, WITH REFLEX
Albumin ELP: 4.4 g/dL (ref 3.8–4.8)
Alpha 1: 0.3 g/dL (ref 0.2–0.3)
Alpha 2: 0.6 g/dL (ref 0.5–0.9)
Beta 2: 0.3 g/dL (ref 0.2–0.5)
Beta Globulin: 0.4 g/dL (ref 0.4–0.6)
Gamma Globulin: 0.9 g/dL (ref 0.8–1.7)
Total Protein: 6.8 g/dL (ref 6.1–8.1)

## 2024-01-17 LAB — ANTI-NUCLEAR AB-TITER (ANA TITER)
ANA TITER: 1:80 {titer} — ABNORMAL HIGH
ANA Titer 1: 1:80 {titer} — ABNORMAL HIGH

## 2024-01-17 LAB — VITAMIN D 25 HYDROXY (VIT D DEFICIENCY, FRACTURES): Vit D, 25-Hydroxy: 48 ng/mL (ref 30–100)

## 2024-01-17 LAB — SJOGRENS SYNDROME-B EXTRACTABLE NUCLEAR ANTIBODY: SSB (La) (ENA) Antibody, IgG: <1 AI

## 2024-01-17 LAB — ANTI-SMITH ANTIBODY: ENA SM Ab Ser-aCnc: <1 AI

## 2024-01-17 LAB — C3 AND C4
C3 Complement: 120 mg/dL (ref 82–185)
C4 Complement: 26 mg/dL (ref 15–53)

## 2024-01-17 LAB — ANA: Anti Nuclear Antibody (ANA): POSITIVE — AB

## 2024-01-18 ENCOUNTER — Ambulatory Visit: Payer: Self-pay | Admitting: Rheumatology

## 2024-01-18 NOTE — Progress Notes (Signed)
 ANA is low titer positive, ENA panel negative, complements normal, urine protein creatinine ratio normal, SPEP normal, vitamin D  normal.  I will discuss results at the follow-up visit.

## 2024-01-28 ENCOUNTER — Other Ambulatory Visit: Payer: Self-pay | Admitting: Surgery

## 2024-01-28 NOTE — Progress Notes (Signed)
 REFERRING PHYSICIAN:  Lynwood Laneta Blush, * PROVIDER:  VICENTA DASIE POLI, MD MRN: [id number] DOB: [birth year] DATE OF ENCOUNTER: 01/28/2024 Subjective    Chief Complaint: New Consultation   History of Present Illness: Raymond Kelly is a 53 y.o. male who is seen today as an office consultation for evaluation of New Consultation   Mr. Duer is known to me.  I performed a laparoscopic cholecystectomy on him back in 2017.  He had a postoperative bile leak requiring stenting at that time. I am seeing him today for abdominal wall masses.  He has a history of having had lipomas.  He has also had issue with elevated bilirubin to 1.4.  This has been worked up extensively.  There is a question of its disease.  He has also had issues with occasional confusion. Over time, he has continued to have increasing abdominal masses in his left flank and right upper quadrant.  They are becoming larger and causing discomfort especially in the sitting position and with activity.  He has no fevers or chills.  There has been no trauma to these areas.    Review of Systems: A complete review of systems was obtained from the patient.  I have reviewed this information and discussed as appropriate with the patient.  See HPI as well for other ROS.  ROS   Medical History: Past Medical History:  Diagnosis Date  . Anxiety   . Hyperlipidemia   . Sleep apnea     There is no problem list on file for this patient.   Past Surgical History:  Procedure Laterality Date  . CHOLECYSTECTOMY       No Known Allergies  Current Outpatient Medications on File Prior to Visit  Medication Sig Dispense Refill  . rosuvastatin  (CRESTOR ) 20 MG tablet Take 20 mg by mouth once daily     No current facility-administered medications on file prior to visit.    Family History  Problem Relation Age of Onset  . Obesity Mother   . High blood pressure (Hypertension) Mother   . Hyperlipidemia (Elevated cholesterol) Mother    . Coronary Artery Disease (Blocked arteries around heart) Mother   . Stroke Father   . High blood pressure (Hypertension) Father   . Hyperlipidemia (Elevated cholesterol) Father   . Hyperlipidemia (Elevated cholesterol) Brother   . Diabetes Brother      Social History   Tobacco Use  Smoking Status Never  Smokeless Tobacco Never     Social History   Socioeconomic History  . Marital status: Married  Tobacco Use  . Smoking status: Never  . Smokeless tobacco: Never  Substance and Sexual Activity  . Alcohol use: Never  . Drug use: Never   Social Drivers of Health   Food Insecurity: Low Risk  (12/22/2023)   Received from [facility]   Hunger Vital Sign   . Within the past 12 months, you worried that your food would run out before you got money to buy more: Never true   . Within the past 12 months, the food you bought just didn't last and you didn't have money to get more. : Never true  Transportation Needs: No Transportation Needs (12/22/2023)   Received from [facility]   . In the past 12 months, has lack of reliable transportation kept you from medical appointments, meetings, work or from getting things needed for daily living? : No  Housing Stability: Unknown (01/28/2024)   Housing Stability Vital Sign   .  Homeless in the Last Year: No    Objective:   Vitals:   01/28/24 0905  BP: 118/78  Pulse: 72  Temp: 36.7 C (98 F)  SpO2: 97%  Weight: 86.9 kg (191 lb 9.6 oz)  Height: 172.7 cm (5' 8)  PainSc:   2  PainLoc: Abdomen    Body mass index is 29.13 kg/m.  Physical Exam   He appears well on exam.  On his left flank there is a tender mass with multiple small surrounding masses measuring at least 6 cm.  It is only slightly mobile and deep.  It is above the fascia.  On his right upper quadrant there is a mass that is almost 8 cm in size in the deep subcutaneous tissue as well.  There is no fascial defect to suggest this is a hernia.  It is  tender to palpation and there are no skin changes  Labs, Imaging and Diagnostic Testing: Reviewed the notes in his electronic medical records  Assessment and Plan:     Diagnoses and all orders for this visit:  Abdominal wall mass of left flank  Abdominal wall mass of right upper quadrant  Left mass is 6 cm and right mass is 8 cm   Given the increase in size of the left flank mass and right upper quad abdominal wall mass, his overall history of multiple masses and the symptoms he is having from the masses as well as his overall chronic medical condition, I believe surgical excision of the masses is longer recommended both to relieve his discomfort and for complete histologic evaluation to rule out a desmoid tumor or sarcoma.  We discussed the surgical procedure in detail.  We discussed the risk which includes but is not limited to bleeding, infection, injury to surrounding structures, recurrence of the masses, the need for further procedures, cardiopulmonary issues with anesthesia, etc.  He understands and wished to proceed with surgery which will be scheduled.     VICENTA DASIE POLI, MD

## 2024-01-30 NOTE — Progress Notes (Deleted)
 Office Visit Note  Patient: Raymond Kelly             Date of Birth: [birth year]           MRN: [id number]             PCP: Debrah Josette ORN., PA-C Referring: Debrah Josette ORN., PA-C Visit Date: 02/12/2024 Occupation: @GUAROCC @  Subjective:  No chief complaint on file.   History of Present Illness: Raymond Kelly is a 53 y.o. male ***     Activities of Daily Living:  Patient reports morning stiffness for *** {minute/hour:19697}.   Patient {ACTIONS;DENIES/REPORTS:21021675::Denies} nocturnal pain.  Difficulty dressing/grooming: {ACTIONS;DENIES/REPORTS:21021675::Denies} Difficulty climbing stairs: {ACTIONS;DENIES/REPORTS:21021675::Denies} Difficulty getting out of chair: {ACTIONS;DENIES/REPORTS:21021675::Denies} Difficulty using hands for taps, buttons, cutlery, and/or writing: {ACTIONS;DENIES/REPORTS:21021675::Denies}  No Rheumatology ROS completed.   PMFS History:  Patient Active Problem List   Diagnosis Date Noted   New onset atrial fibrillation (HCC) 11/05/2022   OSA (obstructive sleep apnea) 07/09/2021   Insomnia 07/09/2021   Alopecia areata 08/28/2016   Hyperbilirubinemia 02/04/2016   Bile leak, postoperative     Past Medical History:  Diagnosis Date   Alopecia areata    Diverticulitis    Dizziness    Eczema    Family history of adverse reaction to anesthesia    mother slow to awaken   History of kidney stones 2007   Ulcerative colitis (HCC) 2023    Family History  Problem Relation Age of Onset   Heart attack Mother    Ulcerative colitis Mother    Irritable bowel syndrome Mother    Hypertension Mother    Stroke Father    Arthritis Father    ALS Father    Neuropathy Father    Hypertension Father    Other Father        open stomach   Migraines Sister    Asthma Sister    Healthy Sister    Diabetes Brother    Pancreatic cancer Maternal Grandfather    Juvenile Diabetes Niece    Colon cancer Neg Hx    Rectal cancer Neg Hx    Stomach  cancer Neg Hx    Esophageal cancer Neg Hx    Past Surgical History:  Procedure Laterality Date   BIOPSY  09/30/2021   Procedure: BIOPSY;  Surgeon: Legrand Victory LITTIE DOUGLAS, MD;  Location: [facility];  Service: Gastroenterology;;   CHOLECYSTECTOMY N/A 02/01/2016   Procedure: LAPAROSCOPIC CHOLECYSTECTOMY WITH INTRAOPERATIVE CHOLANGIOGRAM;  Surgeon: Vicenta Poli, MD;  Location: [facility];  Service: General;  Laterality: N/A;   COLONOSCOPY     COLONOSCOPY WITH PROPOFOL  N/A 09/30/2021   Procedure: COLONOSCOPY WITH PROPOFOL ;  Surgeon: Legrand Victory LITTIE DOUGLAS, MD;  Location: [facility];  Service: Gastroenterology;  Laterality: N/A;   cyst removed     between his front teeth and cyst removed from abdomal area   ESOPHAGOGASTRODUODENOSCOPY (EGD) WITH PROPOFOL  N/A 03/06/2016   Procedure: ESOPHAGOGASTRODUODENOSCOPY (EGD) WITH PROPOFOL ;  Surgeon: Victory LITTIE Legrand DOUGLAS, MD;  Location: [facility];  Service: Gastroenterology;  Laterality: N/A;  with stent removal.   GASTROINTESTINAL STENT REMOVAL N/A 03/06/2016   Procedure: GASTROINTESTINAL STENT REMOVAL;  Surgeon: Victory LITTIE Legrand DOUGLAS, MD;  Location: [facility];  Service: Gastroenterology;  Laterality: N/A;   POLYPECTOMY  09/30/2021   Procedure: POLYPECTOMY;  Surgeon: Legrand Victory LITTIE DOUGLAS, MD;  Location: [facility];  Service: Gastroenterology;;   TONSILLECTOMY     ULNAR NERVE TRANSPOSITION     VASECTOMY     Social History  Social History Narrative   Lives with wife in [place]    Immunization History  Administered Date(s) Administered   PFIZER(Purple Top)SARS-COV-2 Vaccination 07/26/2019, 08/16/2019     Objective: Vital Signs: There were no vitals taken for this visit.   Physical Exam   Musculoskeletal Exam: ***  CDAI Exam: CDAI Score: -- Patient Global: --; Provider Global: -- Swollen: --; Tender: -- Joint Exam 02/12/2024   No joint exam has been documented for this visit   There is currently no information documented on the  homunculus. Go to the Rheumatology activity and complete the homunculus joint exam.  Investigation: No additional findings.  Imaging: No results found.  Recent Labs: Lab Results  Component Value Date   WBC 6.0 10/30/2022   HGB 15.3 10/30/2022   PLT 221 10/30/2022   NA 142 11/21/2022   K 4.1 11/21/2022   CL 104 11/21/2022   CO2 24 11/21/2022   GLUCOSE 88 11/21/2022   BUN 15 11/21/2022   CREATININE 1.24 11/21/2022   BILITOT 0.9 08/06/2021   ALKPHOS 82 08/06/2021   AST 16 08/06/2021   ALT 16 08/06/2021   PROT 6.8 01/12/2024   ALBUMIN 4.1 08/06/2021   CALCIUM  9.2 11/21/2022   GFRAA >60 09/13/2019   08/04/23: ANA Negative, ESR WNL, CRP WNL, CH50 >60, immunoglobulins WNL. 10/14/22: C3 and C4 WNL, ESR WNL   July 2025 SPEP normal, urine protein creatinine ratio normal, ANA 1: 80 NS, 1: 80 cytoplasmic, ENA (SCL 70, RNP, Smith, SSA, SSB, dsDNA) negative, C3-C4 normal, vitamin D  48   Speciality Comments: No specialty comments available.  Procedures:  No procedures performed Allergies: Patient has no known allergies.   Assessment / Plan:     Visit Diagnoses: No diagnosis found.  Orders: No orders of the defined types were placed in this encounter.  No orders of the defined types were placed in this encounter.   Face-to-face time spent with patient was *** minutes. Greater than 50% of time was spent in counseling and coordination of care.  Follow-Up Instructions: No follow-ups on file.   Maya Nash, MD  Note - This record has been created using Animal nutritionist.  Chart creation errors have been sought, but may not always  have been located. Such creation errors do not reflect on  the standard of medical care.

## 2024-02-10 ENCOUNTER — Telehealth: Payer: Self-pay | Admitting: Rheumatology

## 2024-02-10 ENCOUNTER — Ambulatory Visit: Admitting: Gastroenterology

## 2024-02-10 NOTE — Telephone Encounter (Signed)
 Left message to advise patient, no need to keep the follow-up visit.  Copy of lab results sent to his PCP.  Cancelled new patient follow-up appointment.

## 2024-02-10 NOTE — Telephone Encounter (Signed)
 No need to keep the follow-up visit.  Please forward lab results to his PCP.  Please cancel new patient follow-up appointment.

## 2024-02-10 NOTE — Telephone Encounter (Signed)
 Patient called stating he is scheduled to see Dr. Dolphus for a npt follow-up appointment on Friday, 02/12/24 and requested a return call to let him know if he needs to keep it.  Patient states he was able to review the results on mychart and doesn't see a need to come in the office unless Dr. Dolphus feels there is an additional test that is needed.  Patient states all his doctor's send notes with the results.

## 2024-02-12 ENCOUNTER — Ambulatory Visit: Payer: BC Managed Care – PPO | Admitting: Rheumatology

## 2024-02-12 DIAGNOSIS — R5383 Other fatigue: Secondary | ICD-10-CM

## 2024-02-12 DIAGNOSIS — F5101 Primary insomnia: Secondary | ICD-10-CM

## 2024-02-12 DIAGNOSIS — R350 Frequency of micturition: Secondary | ICD-10-CM

## 2024-02-12 DIAGNOSIS — K580 Irritable bowel syndrome with diarrhea: Secondary | ICD-10-CM

## 2024-02-12 DIAGNOSIS — L639 Alopecia areata, unspecified: Secondary | ICD-10-CM

## 2024-02-12 DIAGNOSIS — G4733 Obstructive sleep apnea (adult) (pediatric): Secondary | ICD-10-CM

## 2024-02-12 DIAGNOSIS — R4189 Other symptoms and signs involving cognitive functions and awareness: Secondary | ICD-10-CM

## 2024-02-12 DIAGNOSIS — E559 Vitamin D deficiency, unspecified: Secondary | ICD-10-CM

## 2024-02-12 DIAGNOSIS — G8929 Other chronic pain: Secondary | ICD-10-CM

## 2024-02-12 DIAGNOSIS — R768 Other specified abnormal immunological findings in serum: Secondary | ICD-10-CM

## 2024-03-16 ENCOUNTER — Other Ambulatory Visit: Payer: Self-pay

## 2024-03-16 ENCOUNTER — Encounter (HOSPITAL_BASED_OUTPATIENT_CLINIC_OR_DEPARTMENT_OTHER): Payer: Self-pay | Admitting: Surgery

## 2024-03-16 NOTE — Progress Notes (Signed)
   03/16/24 1040  PAT Phone Screen  Is the patient taking a GLP-1 receptor agonist? No  Do You Have Diabetes? No  Do You Have Hypertension? No  Have You Ever Been to the ER for Asthma? No  Have You Taken Oral Steroids in the Past 3 Months? No  Do you Take Phenteramine or any Other Diet Drugs? No  Recent  Lab Work, EKG, CXR? No  Cardiologist Name Seen in Afib clinic in 10/2022 and f/u w/ Dr Barbaraann 11/2022. Zio monitor 12/2022 showed no afib, 4 short runs of SVT. Cardizem  offered prn, pt has not had to take.  Have you ever had tests on your heart? Yes  What cardiac tests were performed? Echo  What date/year were cardiac tests completed? 03/12/2022 EF 60-65%  Normal CTA 11/24/22  Results viewable: [facility]  Any Recent Hospitalizations? No  Height 5' 8.5 (1.74 m)  Weight 86.2 kg  Pat Appointment Scheduled Yes (EKG)   Above d/w Dr Jerrye. No cardiac clearance needed. Will need EKG at PAT visit.

## 2024-03-21 ENCOUNTER — Encounter (HOSPITAL_BASED_OUTPATIENT_CLINIC_OR_DEPARTMENT_OTHER)
Admission: RE | Admit: 2024-03-21 | Discharge: 2024-03-21 | Disposition: A | Discharge status: Home / Self Care | Source: Ambulatory Visit | Attending: Surgery | Admitting: Surgery

## 2024-03-21 DIAGNOSIS — D171 Benign lipomatous neoplasm of skin and subcutaneous tissue of trunk: Secondary | ICD-10-CM | POA: Diagnosis not present

## 2024-03-21 DIAGNOSIS — D1739 Benign lipomatous neoplasm of skin and subcutaneous tissue of other sites: Secondary | ICD-10-CM | POA: Diagnosis not present

## 2024-03-21 DIAGNOSIS — Z01810 Encounter for preprocedural cardiovascular examination: Secondary | ICD-10-CM | POA: Insufficient documentation

## 2024-03-21 DIAGNOSIS — R222 Localized swelling, mass and lump, trunk: Secondary | ICD-10-CM | POA: Diagnosis present

## 2024-03-21 DIAGNOSIS — G4730 Sleep apnea, unspecified: Secondary | ICD-10-CM | POA: Diagnosis not present

## 2024-03-21 DIAGNOSIS — R1901 Right upper quadrant abdominal swelling, mass and lump: Secondary | ICD-10-CM | POA: Diagnosis present

## 2024-03-21 MED ORDER — CHLORHEXIDINE GLUCONATE CLOTH 2 % EX PADS: 6.0000 | MEDICATED_PAD | Freq: Once | CUTANEOUS | Status: DC

## 2024-03-21 MED ORDER — ENSURE PRE-SURGERY PO LIQD: 296.0000 mL | Freq: Once | ORAL | Status: DC

## 2024-03-21 NOTE — Progress Notes (Signed)

## 2024-03-21 NOTE — H&P (Signed)
 REFERRING PHYSICIAN: Lynwood Laneta Blush, * PROVIDER: VICENTA DASIE POLI, MD MRN: [id number] DOB: 04-16-1971   Subjective   Chief Complaint: masses  History of Present Illness: Raymond Kelly is a 53 y.o. male who is seen  as an office consultation for evaluation of masses  Mr. Trigueros is known to me. I performed a laparoscopic cholecystectomy on him back in 2017. He had a postoperative bile leak requiring stenting at that time. I am seeing him today for abdominal wall masses. He has a history of having had lipomas. He has also had issue with elevated bilirubin to 1.4. This has been worked up extensively. There is a question of its disease. He has also had issues with occasional confusion. Over time, he has continued to have increasing abdominal masses in his left flank and right upper quadrant. They are becoming larger and causing discomfort especially in the sitting position and with activity. He has no fevers or chills. There has been no trauma to these areas.  Review of Systems: A complete review of systems was obtained from the patient. I have reviewed this information and discussed as appropriate with the patient. See HPI as well for other ROS.  ROS   Medical History: Past Medical History:  Diagnosis Date  Anxiety  Hyperlipidemia  Sleep apnea   There is no problem list on file for this patient.  Past Surgical History:  Procedure Laterality Date  CHOLECYSTECTOMY    No Known Allergies  Current Outpatient Medications on File Prior to Visit  Medication Sig Dispense Refill  rosuvastatin  (CRESTOR ) 20 MG tablet Take 20 mg by mouth once daily   No current facility-administered medications on file prior to visit.   Family History  Problem Relation Age of Onset  Obesity Mother  High blood pressure (Hypertension) Mother  Hyperlipidemia (Elevated cholesterol) Mother  Coronary Artery Disease (Blocked arteries around heart) Mother  Stroke Father  High blood pressure  (Hypertension) Father  Hyperlipidemia (Elevated cholesterol) Father  Hyperlipidemia (Elevated cholesterol) Brother  Diabetes Brother    Social History   Tobacco Use  Smoking Status Never  Smokeless Tobacco Never    Social History   Socioeconomic History  Marital status: Married  Tobacco Use  Smoking status: Never  Smokeless tobacco: Never  Substance and Sexual Activity  Alcohol use: Never  Drug use: Never   Social Drivers of Health   Food Insecurity: Low Risk (12/22/2023)  Received from [facility]  Hunger Vital Sign  Within the past 12 months, you worried that your food would run out before you got money to buy more: Never true  Within the past 12 months, the food you bought just didn't last and you didn't have money to get more. : Never true  Transportation Needs: No Transportation Needs (12/22/2023)  Received from [employer]  In the past 12 months, has lack of reliable transportation kept you from medical appointments, meetings, work or from getting things needed for daily living? : No  Housing Stability: Unknown (01/28/2024)  Housing Stability Vital Sign  Homeless in the Last Year: No   Objective:   Vitals:   BP: 118/78  Pulse: 72  Temp: 36.7 C (98 F)  SpO2: 97%  Weight: 86.9 kg (191 lb 9.6 oz)  Height: 172.7 cm (5' 8)  PainSc: 2  PainLoc: Abdomen   Body mass index is 29.13 kg/m.  Physical Exam   He appears well on exam.  On his left flank there is a tender mass with multiple small  surrounding masses measuring at least 6 cm. It is only slightly mobile and deep. It is above the fascia.  On his right upper quadrant there is a mass that is almost 8 cm in size in the deep subcutaneous tissue as well. There is no fascial defect to suggest this is a hernia. It is tender to palpation and there are no skin changes  Labs, Imaging and Diagnostic Testing: Reviewed the notes in his electronic medical records  Assessment and Plan:    Diagnoses and all orders for this visit:  Abdominal wall mass of left flank  Abdominal wall mass of right upper quadrant  Left mass is 6 cm and right mass is 8 cm  Given the increase in size of the left flank mass and right upper quad abdominal wall mass, his overall history of multiple masses and the symptoms he is having from the masses as well as his overall chronic medical condition, I believe surgical excision of the masses is longer recommended both to relieve his discomfort and for complete histologic evaluation to rule out a desmoid tumor or sarcoma. We discussed the surgical procedure in detail. We discussed the risk which includes but is not limited to bleeding, infection, injury to surrounding structures, recurrence of the masses, the need for further procedures, cardiopulmonary issues with anesthesia, etc. He understands and wished to proceed with surgery which will be scheduled.

## 2024-03-21 NOTE — Anesthesia Preprocedure Evaluation (Addendum)
 Anesthesia Evaluation  Patient identified by MRN, date of birth, ID band Patient awake    Reviewed: Allergy & Precautions, NPO status , Patient's Chart, lab work & pertinent test results  History of Anesthesia Complications (+) Family history of anesthesia reaction  Airway Mallampati: I       Dental  (+) Dental Advisory Given   Pulmonary sleep apnea    Pulmonary exam normal        Cardiovascular + dysrhythmias Atrial Fibrillation  Rhythm:Regular Rate:Normal     Neuro/Psych    GI/Hepatic Neg liver ROS,,,UC, Diverticulitis   Endo/Other  negative endocrine ROS    Renal/GU      Musculoskeletal   Abdominal   Peds  Hematology   Anesthesia Other Findings   Reproductive/Obstetrics                              Anesthesia Physical Anesthesia Plan  ASA: 2  Anesthesia Plan: General   Post-op Pain Management:    Induction: Intravenous  PONV Risk Score and Plan: 1 and Ondansetron  and Dexamethasone   Airway Management Planned: Oral ETT and Video Laryngoscope Planned  Additional Equipment:   Intra-op Plan:   Post-operative Plan: Extubation in OR  Informed Consent:      Dental advisory given  Plan Discussed with: CRNA and Surgeon  Anesthesia Plan Comments:          Anesthesia Quick Evaluation

## 2024-03-22 ENCOUNTER — Ambulatory Visit (HOSPITAL_BASED_OUTPATIENT_CLINIC_OR_DEPARTMENT_OTHER)
Admission: RE | Admit: 2024-03-22 | Discharge: 2024-03-22 | Disposition: A | Discharge status: Home / Self Care | Attending: Surgery | Admitting: Surgery

## 2024-03-22 ENCOUNTER — Ambulatory Visit (HOSPITAL_BASED_OUTPATIENT_CLINIC_OR_DEPARTMENT_OTHER): Payer: Self-pay

## 2024-03-22 ENCOUNTER — Other Ambulatory Visit: Payer: Self-pay

## 2024-03-22 ENCOUNTER — Encounter (HOSPITAL_BASED_OUTPATIENT_CLINIC_OR_DEPARTMENT_OTHER): Payer: Self-pay | Admitting: Surgery

## 2024-03-22 ENCOUNTER — Encounter (HOSPITAL_BASED_OUTPATIENT_CLINIC_OR_DEPARTMENT_OTHER)
Admission: RE | Disposition: A | Discharge status: Home / Self Care | Payer: Self-pay | Source: Home / Self Care | Attending: Surgery

## 2024-03-22 DIAGNOSIS — G4730 Sleep apnea, unspecified: Secondary | ICD-10-CM | POA: Insufficient documentation

## 2024-03-22 DIAGNOSIS — D1739 Benign lipomatous neoplasm of skin and subcutaneous tissue of other sites: Secondary | ICD-10-CM | POA: Insufficient documentation

## 2024-03-22 DIAGNOSIS — I2510 Atherosclerotic heart disease of native coronary artery without angina pectoris: Secondary | ICD-10-CM

## 2024-03-22 DIAGNOSIS — Z01818 Encounter for other preprocedural examination: Secondary | ICD-10-CM

## 2024-03-22 DIAGNOSIS — D171 Benign lipomatous neoplasm of skin and subcutaneous tissue of trunk: Secondary | ICD-10-CM | POA: Insufficient documentation

## 2024-03-22 HISTORY — PX: EXCISION MASS, BACK: SHX7560

## 2024-03-22 HISTORY — PX: EXCISION OF ABDOMINAL WALL TUMOR: SHX6687

## 2024-03-22 SURGERY — EXCISION MASS, BACK
Anesthesia: General | Site: Flank | Laterality: Right

## 2024-03-22 MED ORDER — ROCURONIUM BROMIDE 10 MG/ML (PF) SYRINGE
PREFILLED_SYRINGE | INTRAVENOUS | Status: AC
Start: 2024-03-22 — End: 2024-03-22
  Filled 2024-03-22: qty 30

## 2024-03-22 MED ORDER — MIDAZOLAM HCL 2 MG/2ML IJ SOLN
INTRAMUSCULAR | Status: AC
  Filled 2024-03-22: qty 2

## 2024-03-22 MED ORDER — CEFAZOLIN SODIUM-DEXTROSE 2-3 GM-%(50ML) IV SOLR
INTRAVENOUS | Status: DC | PRN
  Administered 2024-03-22: 2 g via INTRAVENOUS

## 2024-03-22 MED ORDER — CEFAZOLIN SODIUM-DEXTROSE 2-4 GM/100ML-% IV SOLN
INTRAVENOUS | Status: AC
Start: 2024-03-22 — End: 2024-03-22
  Filled 2024-03-22: qty 100

## 2024-03-22 MED ORDER — EPHEDRINE 5 MG/ML INJ
INTRAVENOUS | Status: AC
  Filled 2024-03-22: qty 5

## 2024-03-22 MED ORDER — DEXMEDETOMIDINE HCL IN NACL 80 MCG/20ML IV SOLN
INTRAVENOUS | Status: DC | PRN
Start: 2024-03-22 — End: 2024-03-22
  Administered 2024-03-22 (×2): 4 ug via INTRAVENOUS

## 2024-03-22 MED ORDER — MIDAZOLAM HCL 2 MG/2ML IJ SOLN
INTRAMUSCULAR | Status: DC | PRN
  Administered 2024-03-22: 2 mg via INTRAVENOUS

## 2024-03-22 MED ORDER — DEXAMETHASONE SODIUM PHOSPHATE 10 MG/ML IJ SOLN
INTRAMUSCULAR | Status: DC | PRN
  Administered 2024-03-22: 10 mg via INTRAVENOUS

## 2024-03-22 MED ORDER — OXYCODONE HCL 5 MG PO TABS: 5.0000 mg | ORAL_TABLET | Freq: Once | ORAL | Status: DC | PRN

## 2024-03-22 MED ORDER — ONDANSETRON HCL 4 MG/2ML IJ SOLN
INTRAMUSCULAR | Status: AC
  Filled 2024-03-22: qty 4

## 2024-03-22 MED ORDER — TRAMADOL HCL 50 MG PO TABS: 50.0000 mg | ORAL_TABLET | Freq: Four times a day (QID) | ORAL | 0 refills | Status: AC | PRN

## 2024-03-22 MED ORDER — LIDOCAINE 2% (20 MG/ML) 5 ML SYRINGE
INTRAMUSCULAR | Status: DC | PRN
  Administered 2024-03-22: 80 mg via INTRAVENOUS

## 2024-03-22 MED ORDER — BUPIVACAINE-EPINEPHRINE 0.5% -1:200000 IJ SOLN
INTRAMUSCULAR | Status: DC | PRN
  Administered 2024-03-22: 30 mL

## 2024-03-22 MED ORDER — BUPIVACAINE-EPINEPHRINE (PF) 0.5% -1:200000 IJ SOLN
INTRAMUSCULAR | Status: AC
  Filled 2024-03-22: qty 30

## 2024-03-22 MED ORDER — ONDANSETRON HCL 4 MG/2ML IJ SOLN
INTRAMUSCULAR | Status: DC | PRN
  Administered 2024-03-22: 4 mg via INTRAVENOUS

## 2024-03-22 MED ORDER — LIDOCAINE 2% (20 MG/ML) 5 ML SYRINGE
INTRAMUSCULAR | Status: AC
  Filled 2024-03-22: qty 10

## 2024-03-22 MED ORDER — 0.9 % SODIUM CHLORIDE (POUR BTL) OPTIME
TOPICAL | Status: DC | PRN
Start: 2024-03-22 — End: 2024-03-22
  Administered 2024-03-22: 120 mL

## 2024-03-22 MED ORDER — CEFAZOLIN SODIUM-DEXTROSE 2-4 GM/100ML-% IV SOLN: 2.0000 g | INTRAVENOUS | Status: DC

## 2024-03-22 MED ORDER — FENTANYL CITRATE (PF) 100 MCG/2ML IJ SOLN
INTRAMUSCULAR | Status: DC | PRN
  Administered 2024-03-22: 100 ug via INTRAVENOUS

## 2024-03-22 MED ORDER — PROPOFOL 10 MG/ML IV BOLUS
INTRAVENOUS | Status: DC | PRN
  Administered 2024-03-22: 120 mg via INTRAVENOUS

## 2024-03-22 MED ORDER — ROCURONIUM BROMIDE 10 MG/ML (PF) SYRINGE
PREFILLED_SYRINGE | INTRAVENOUS | Status: DC | PRN
  Administered 2024-03-22: 60 mg via INTRAVENOUS

## 2024-03-22 MED ORDER — DROPERIDOL 2.5 MG/ML IJ SOLN: 0.6250 mg | Freq: Once | INTRAMUSCULAR | Status: DC | PRN

## 2024-03-22 MED ORDER — OXYCODONE HCL 5 MG/5ML PO SOLN: 5.0000 mg | Freq: Once | ORAL | Status: DC | PRN

## 2024-03-22 MED ORDER — DEXAMETHASONE SODIUM PHOSPHATE 10 MG/ML IJ SOLN
INTRAMUSCULAR | Status: AC
  Filled 2024-03-22: qty 2

## 2024-03-22 MED ORDER — FENTANYL CITRATE (PF) 100 MCG/2ML IJ SOLN
INTRAMUSCULAR | Status: AC
  Filled 2024-03-22: qty 2

## 2024-03-22 MED ORDER — ACETAMINOPHEN 500 MG PO TABS
ORAL_TABLET | ORAL | Status: AC
Start: 2024-03-22 — End: 2024-03-22
  Filled 2024-03-22: qty 2

## 2024-03-22 MED ORDER — ONDANSETRON HCL 4 MG/2ML IJ SOLN: 4.0000 mg | Freq: Once | INTRAMUSCULAR | Status: DC | PRN

## 2024-03-22 MED ORDER — SUGAMMADEX SODIUM 200 MG/2ML IV SOLN
INTRAVENOUS | Status: DC | PRN
  Administered 2024-03-22: 180 mg via INTRAVENOUS

## 2024-03-22 MED ORDER — EPHEDRINE SULFATE-NACL 50-0.9 MG/10ML-% IV SOSY
PREFILLED_SYRINGE | INTRAVENOUS | Status: DC | PRN
  Administered 2024-03-22 (×3): 5 mg via INTRAVENOUS

## 2024-03-22 MED ORDER — SUCCINYLCHOLINE CHLORIDE 200 MG/10ML IV SOSY
PREFILLED_SYRINGE | INTRAVENOUS | Status: AC
  Filled 2024-03-22: qty 10

## 2024-03-22 MED ORDER — ACETAMINOPHEN 500 MG PO TABS
1000.0000 mg | ORAL_TABLET | ORAL | Status: AC
  Administered 2024-03-22: 1000 mg via ORAL

## 2024-03-22 MED ORDER — FENTANYL CITRATE (PF) 100 MCG/2ML IJ SOLN: 25.0000 ug | INTRAMUSCULAR | Status: DC | PRN

## 2024-03-22 MED ORDER — LACTATED RINGERS IV SOLN: INTRAVENOUS | Status: DC

## 2024-03-22 SURGICAL SUPPLY — 31 items
BLADE CLIPPER SURG (BLADE) IMPLANT
BLADE SURG 15 STRL LF DISP TIS (BLADE) ×2 IMPLANT
BNDG ELASTIC 3INX 5YD STR LF (GAUZE/BANDAGES/DRESSINGS) IMPLANT
BNDG ELASTIC 4INX 5YD STR LF (GAUZE/BANDAGES/DRESSINGS) IMPLANT
CANISTER SUCT 1200ML W/VALVE (MISCELLANEOUS) IMPLANT
CHLORAPREP W/TINT 26 (MISCELLANEOUS) ×4 IMPLANT
COVER BACK TABLE 60X90IN (DRAPES) ×2 IMPLANT
COVER MAYO STAND STRL (DRAPES) ×2 IMPLANT
DERMABOND ADVANCED .7 DNX12 (GAUZE/BANDAGES/DRESSINGS) ×4 IMPLANT
DRAPE LAPAROTOMY 100X72 PEDS (DRAPES) ×4 IMPLANT
DRAPE UTILITY XL STRL (DRAPES) ×4 IMPLANT
ELECTRODE REM PT RTRN 9FT ADLT (ELECTROSURGICAL) ×2 IMPLANT
GLOVE SURG SIGNA 7.5 PF LTX (GLOVE) ×4 IMPLANT
GOWN STRL REUS W/ TWL LRG LVL3 (GOWN DISPOSABLE) ×2 IMPLANT
GOWN STRL REUS W/ TWL XL LVL3 (GOWN DISPOSABLE) ×4 IMPLANT
NDL HYPO 25X1 1.5 SAFETY (NEEDLE) ×2 IMPLANT
NEEDLE HYPO 25X1 1.5 SAFETY (NEEDLE) ×2 IMPLANT
NS IRRIG 1000ML POUR BTL (IV SOLUTION) IMPLANT
PACK BASIN DAY SURGERY FS (CUSTOM PROCEDURE TRAY) ×2 IMPLANT
PENCIL SMOKE EVACUATOR (MISCELLANEOUS) ×2 IMPLANT
SLEEVE SCD COMPRESS KNEE MED (STOCKING) IMPLANT
SPIKE FLUID TRANSFER (MISCELLANEOUS) IMPLANT
SPONGE T-LAP 4X18 ~~LOC~~+RFID (SPONGE) ×4 IMPLANT
SUT MNCRL AB 4-0 PS2 18 (SUTURE) IMPLANT
SUT VIC AB 2-0 SH 27XBRD (SUTURE) IMPLANT
SUT VIC AB 3-0 SH 27X BRD (SUTURE) IMPLANT
SYR BULB EAR ULCER 3OZ GRN STR (SYRINGE) IMPLANT
SYR CONTROL 10ML LL (SYRINGE) ×2 IMPLANT
TOWEL GREEN STERILE FF (TOWEL DISPOSABLE) ×4 IMPLANT
TUBE CONNECTING 20X1/4 (TUBING) IMPLANT
YANKAUER SUCT BULB TIP NO VENT (SUCTIONS) IMPLANT

## 2024-03-22 NOTE — Op Note (Signed)
   Raymond Kelly [facility] 03/22/2024   Pre-op Diagnosis: LEFT FLANK MASS, RIGHT ABDOMINAL WALL MASS     Post-op Diagnosis: same  Procedure(s): EXCISION MASS, LEFT FLANK (7 cm) EXCISION, RIGHT UPPER QUADRANT ABDOMINAL WALL MASS (5 cm)  Surgeon(s): Vernetta Berg, MD  Anesthesia: General  Staff:  Circulator: Jorja Arland HERO, RN Relief Circulator: McDonough-Hughes, Jodi C, RN Scrub Person: Milford Asberry CROME  Estimated Blood Loss: minimal               Specimens: sent to path  Findings: The patient was found to have a conglomerate of subcutaneous masses in the left flank and right upper quadrant of the abdominal wall.  This measured 7 cm in the left flank and 5 cm on the right upper quadrant.  They appear consistent with lipomas  Procedure: The patient was brought to the operating identifies correct patient.  He was placed upon on the operating table and general anesthesia was induced.  His abdomen and flank were then prepped and draped in usual sterile fashion.  I anesthetized skin in the left flank just underneath the costal margin with Marcaine  and then made a longitudinal scissors with the scalpel.  The patient had multiple small masses together over 1 large mass excised piecemeal with electrocautery.  These appear consistent with lipomas.  I then evaluated the area in all directions.  There appeared to be no masses over the top of the rib or in the muscle itself.  Hemostasis was then achieved with cautery.  I anesthetized the muscle and subcutaneous tissue further with Marcaine .  I then closed the subcutaneous tissue with interrupted 2-0 Vicryl sutures and closed the skin with a running 4-0 Monocryl.  Dermabond was then applied I then anesthetized skin in the right upper quadrant of the abdominal wall with Marcaine .  I made a longitudinal incision again with a scalpel.  I then dissected down to the subcutaneous tissue and again found multiple small masses and a conglomerate together.  I  excised approximately 5 cm of tissue consistent with lipomas and sent this to pathology for evaluation as well.  Again hemostasis was achieved with the cautery.  I then closed the subcutaneous tissue with interrupted 3-0 Vicryl sutures and closed the skin with a running 4-0 Monocryl.  Dermabond was then applied.  The patient tolerated the procedure well.  All the counts were correct at the end of the procedure.  The patient was then extubated in the operating room and taken in stable condition to the recovery room.          Berg Vernetta   Date: 03/22/2024  Time: 8:35 AM

## 2024-03-22 NOTE — Anesthesia Procedure Notes (Signed)
 Procedure Name: Intubation Date/Time: 03/22/2024 7:44 AM  Performed by: Leotha Andrez DEL, CRNAPre-anesthesia Checklist: Patient identified, Emergency Drugs available, Suction available, Patient being monitored and Timeout performed Patient Re-evaluated:Patient Re-evaluated prior to induction Oxygen Delivery Method: Circle system utilized Preoxygenation: Pre-oxygenation with 100% oxygen Induction Type: IV induction Ventilation: Mask ventilation without difficulty Laryngoscope Size: Glidescope and 4 Grade View: Grade I Tube type: Oral Tube size: 7.0 mm Number of attempts: 1 Airway Equipment and Method: Stylet Placement Confirmation: ETT inserted through vocal cords under direct vision, positive ETCO2 and breath sounds checked- equal and bilateral Secured at: 23 cm Tube secured with: Tape Dental Injury: Teeth and Oropharynx as per pre-operative assessment  Comments: Pt has history of uvula necrosis. Elective glidescope performed. Grade 1 view. Atraumatic intubation.

## 2024-03-22 NOTE — Discharge Instructions (Addendum)
 You may shower starting tomorrow  Ice pack, Tylenol , and ibuprofen  also for pain  Next dose of Tylenol  may be taken at 12nn(if needed) Post Anesthesia Home Care Instructions  Activity: Get plenty of rest for the remainder of the day. A responsible individual must stay with you for 24 hours following the procedure.  For the next 24 hours, DO NOT: -Drive a car -Advertising copywriter -Drink alcoholic beverages -Take any medication unless instructed by your physician -Make any legal decisions or sign important papers.  Meals: Start with liquid foods such as gelatin or soup. Progress to regular foods as tolerated. Avoid greasy, spicy, heavy foods. If nausea and/or vomiting occur, drink only clear liquids until the nausea and/or vomiting subsides. Call your physician if vomiting continues.  Special Instructions/Symptoms: Your throat may feel dry or sore from the anesthesia or the breathing tube placed in your throat during surgery. If this causes discomfort, gargle with warm salt water. The discomfort should disappear within 24 hours.  If you had a scopolamine patch placed behind your ear for the management of post- operative nausea and/or vomiting:  1. The medication in the patch is effective for 72 hours, after which it should be removed.  Wrap patch in a tissue and discard in the trash. Wash hands thoroughly with soap and water. 2. You may remove the patch earlier than 72 hours if you experience unpleasant side effects which may include dry mouth, dizziness or visual disturbances. 3. Avoid touching the patch. Wash your hands with soap and water after contact with the patch.        No vigorous activity for 1 week

## 2024-03-22 NOTE — Interval H&P Note (Signed)
 History and Physical Interval Note: no change in H and P  03/22/2024 7:10 AM  [name]  has presented today for surgery, with the diagnosis of LEFT FLANK MASS, RIGHT ABDOMINAL WALL MASS.  The various methods of treatment have been discussed with the patient and family. After consideration of risks, benefits and other options for treatment, the patient has consented to  Procedure(s) with comments: EXCISION MASS, BACK (Left) - LMA EXCISION, NEOPLASM, ABDOMINAL WALL (Right) - LMA as a surgical intervention.  The patient's history has been reviewed, patient examined, no change in status, stable for surgery.  I have reviewed the patient's chart and labs.  Questions were answered to the patient's satisfaction.     Raymond Kelly

## 2024-03-22 NOTE — Transfer of Care (Signed)
 Immediate Anesthesia Transfer of Care Note  Patient: Raymond Kelly  Procedure(s) Performed: EXCISION MASS, LEFT FLANK (Left: Flank) EXCISION, RIGHT UPPER QUADRANT ABDOMINAL WALL MASS (Right: Abdomen)  Patient Location: PACU  Anesthesia Type:General  Level of Consciousness: drowsy and patient cooperative  Airway & Oxygen Therapy: Patient Spontanous Breathing and Patient connected to face mask oxygen  Post-op Assessment: Report given to RN and Post -op Vital signs reviewed and stable  Post vital signs: Reviewed and stable  Last Vitals:  Vitals Value Taken Time  BP 117/76 03/22/24 08:48  Temp 36.2 C 03/22/24 08:48  Pulse 75 03/22/24 08:53  Resp 16 03/22/24 08:53  SpO2 99 % 03/22/24 08:53  Vitals shown include unfiled device data.  Last Pain:  Vitals:   03/22/24 0848  PainSc: 0-No pain         Complications: No notable events documented.

## 2024-03-22 NOTE — Anesthesia Postprocedure Evaluation (Signed)
 Anesthesia Post Note  Patient: Raymond Kelly  Procedure(s) Performed: EXCISION MASS, LEFT FLANK (Left: Flank) EXCISION, RIGHT UPPER QUADRANT ABDOMINAL WALL MASS (Right: Abdomen)     Patient location during evaluation: PACU Anesthesia Type: General Level of consciousness: awake and alert Pain management: pain level controlled Vital Signs Assessment: post-procedure vital signs reviewed and stable Respiratory status: spontaneous breathing, nonlabored ventilation and respiratory function stable Cardiovascular status: blood pressure returned to baseline and stable Postop Assessment: no apparent nausea or vomiting, adequate PO intake and able to ambulate Anesthetic complications: no   No notable events documented.  Last Vitals:  Vitals:   03/22/24 0915 03/22/24 0936  BP: 117/75 117/76  Pulse: 67 62  Resp: 15 15  Temp:  (!) 36.2 C  SpO2: 96% 97%    Last Pain:  Vitals:   03/22/24 0936  PainSc: 0-No pain                 Lauraine KATHEE [place]

## 2024-03-23 ENCOUNTER — Encounter (HOSPITAL_BASED_OUTPATIENT_CLINIC_OR_DEPARTMENT_OTHER): Payer: Self-pay | Admitting: Surgery

## 2024-03-23 LAB — SURGICAL PATHOLOGY

## 2024-06-15 ENCOUNTER — Telehealth: Payer: Self-pay

## 2024-06-15 NOTE — Telephone Encounter (Signed)
 Copied from CRM #[id number]. Topic: Clinical - Order For Equipment >> Jun 15, 2024  3:46 PM [name] wrote: Reason for CRM: Patient is calling to request a new DME order for CPAP supplies through [employer]. Informed patient will send request but patient may need appointment, as patient last OV appears to have been 07/2021 and he will soon be considered a new patient to the practice. Patient Is going to verify insurance network coverage with office and c/b to schedule.   Called and spoke to pt - advised that we are unable to order supplies as pt has not been seen in over two years. Pt verbalized understanding and stated that he will check with his insurance to ensure that we are in-network, then will call back to schedule. NFN
